# Patient Record
Sex: Female | Born: 1935 | Race: Black or African American | Hispanic: No | Marital: Married | Smoking: Never smoker
Health system: Southern US, Community
[De-identification: ages and names within clinical notes are randomized; demographics above are authoritative.]

## PROBLEM LIST (undated history)

## (undated) DIAGNOSIS — J45909 Unspecified asthma, uncomplicated: Secondary | ICD-10-CM

## (undated) DIAGNOSIS — I10 Essential (primary) hypertension: Secondary | ICD-10-CM

## (undated) DIAGNOSIS — C8590 Non-Hodgkin lymphoma, unspecified, unspecified site: Secondary | ICD-10-CM

## (undated) HISTORY — PX: INGUINAL LYMPH NODE BIOPSY: SHX5865

## (undated) HISTORY — DX: Unspecified asthma, uncomplicated: J45.909

## (undated) HISTORY — DX: Essential (primary) hypertension: I10

---

## 1999-11-14 ENCOUNTER — Ambulatory Visit (HOSPITAL_COMMUNITY): Admission: RE | Admit: 1999-11-14 | Discharge: 1999-11-14 | Payer: Self-pay | Admitting: *Deleted

## 2000-02-26 ENCOUNTER — Encounter: Admission: RE | Admit: 2000-02-26 | Discharge: 2000-02-26 | Payer: Self-pay | Admitting: *Deleted

## 2001-11-04 ENCOUNTER — Ambulatory Visit (HOSPITAL_COMMUNITY): Admission: RE | Admit: 2001-11-04 | Discharge: 2001-11-04 | Payer: Self-pay | Admitting: *Deleted

## 2004-04-06 LAB — HM COLONOSCOPY

## 2008-03-08 ENCOUNTER — Encounter: Admission: RE | Admit: 2008-03-08 | Discharge: 2008-03-08 | Payer: Self-pay | Admitting: Orthopedic Surgery

## 2008-03-09 ENCOUNTER — Ambulatory Visit (HOSPITAL_BASED_OUTPATIENT_CLINIC_OR_DEPARTMENT_OTHER): Admission: RE | Admit: 2008-03-09 | Discharge: 2008-03-09 | Payer: Self-pay | Admitting: Orthopedic Surgery

## 2009-12-11 LAB — HM MAMMOGRAPHY: HM Mammogram: NORMAL

## 2011-03-10 NOTE — Op Note (Signed)
Connie Mccann, Connie Mccann                ACCOUNT NO.:  [id number]   MEDICAL RECORD NO.:  [id number]          PATIENT TYPE:  AMB   LOCATION:  DSC                          FACILITY:  [facility]   PHYSICIAN:  Katy Fitch. Sypher, M.D. DATE OF BIRTH:  [birth year]   DATE OF PROCEDURE:  03/09/2008  DATE OF DISCHARGE:                               OPERATIVE REPORT   PREOPERATIVE DIAGNOSES:  1. Entrapment neuropathy, median nerve, left carpal tunnel.  2. Large mass dorsal aspect of left thumb IP joint with plain x-rays      documenting a large intra-articular loose body and clinical      examination suggesting a mucoid cyst that was extra-articular.   POSTOP DIAGNOSES:  Left carpal tunnel syndrome and left thumb IP joint  loose body, degenerative arthritis, and large dorsal mucoid cyst that  was extra-articular.   OPERATING SURGEON:  Katy Fitch. Sypher, MD   ASSISTANT:  Jonni Sanger, PA   ANESTHESIA:  General by LMA, supervising anesthesiologist, Dr. Jean Rosenthal.   OPERATION:  1. Left carpal tunnel release.  2. Left thumb IP joint debridement with removal of large dorsal      intratendinous loose body.  3. Resection of mucoid cyst from dorsal radial capsule of left thumb.   INDICATIONS:  Connie Mccann is a well-known former patient who presented  for evaluation and management of left hand numbness and left thumb IP  joint arthritis/mass formation.  Clinical examination of  electrodiagnostic studies confirmed severe left carpal tunnel syndrome.  Examination left thumb revealed a large mass measuring nearly 1 cm in  diameter.  This was tender to the touch.  This had the features of a  subcutaneous mucoid cyst on the dorsal radial capsule of the thumb IP  joint, and x-ray of the thumb demonstrated degenerative change with a  large loose body deep to the extensor insertion.  We advised Connie Mccann  to proceed with release of her left transcarpal ligament.  We offered  joint debridement for her cyst and  loose body.  She accepted and  proceeded at this time with both procedures.   After informed consent, she was brought to the operating room.   Preoperatively, she was interviewed by Dr. Jean Rosenthal and general  anesthesia by LMA technique recommended.   PROCEDURE:  Connie Mccann was brought to the operating room and placed in  supine position on the operating table.   Following the induction of general anesthesia by LMA technique, the left  arm was prepped with Betadine soap and solution, sterilely draped.  A  pneumatic tourniquet was applied to the proximal brachium.   On exsanguination of left arm with Esmarch bandage, arterial tourniquet  was inflated to 220 mmHg.  1 gram of Ancef was administered as an IV  prophylactic antibiotic prior to inflation of the tourniquet.   The procedure commenced with a short incision in line of the ring finger  of the palm.  Subcutaneous tissues were carefully divided the palmar  fascia.  This split longitudinally to the common sensory branch of the  median nerve.   These were followed  back to the transcarpal ligament which gently  isolated from the median nerve.  The ligament was then released along  its ulnar border extending to the distal forearm.  This widely opened  carpal canal.  Bleeding points were cauterized bipolar current followed  by repair of the skin with intradermal through Prolene suture.   A compressed dressing was applied.  Attention then directed to the  thumb.  A curvilinear incision was fashioned along the radial aspect of  the thumb across the extension crease of the IP joint.  Subcutaneous  tissues were carefully divided taking care to spare the dorsal sensory  nerve branches.  Dorsal veins were electrocauterized with bipolar  current.  A cyst measuring 8 mm diameter was identified.  This was  circumferentially dissected and removed off the central extensor tendon.  The loose body was palpable within the joint.  As it was  midline, we  performed a tendon splitting incision through the extensor insertion.  With great care, the loose body was circumferentially dissected with a  microcurette and rongeur and ultimately removed as a single fragment.  A  microcurette was used to remove marginal osteophytes along the dorsal  aspect of the distal phalanx.  The wound was then irrigated with 2%  lidocaine.   The wound was repaired with mattress suture of 4-0 Vicryl repairing the  extensor split followed by repair of the skin with interrupted suture of  5-0 nylon.   Connie Mccann tolerated both procedures well.   Her thumb was dressed with sterile gauze and Coban, and the wrist  immobilized with a volar plaster splint maintaining the wrist in 5  degrees of dorsiflexion.   There were no apparent complications.  Connie Mccann was transferred to  recovery room in stable signs.      Katy Fitch Sypher, M.D.  Electronically Signed     RVS/MEDQ  D:  03/09/2008  T:  03/09/2008  Job:  [id number]

## 2011-03-13 NOTE — Procedures (Signed)
[place]  Patient:    Connie Mccann                          MRN: [id number] Proc. Date: 11/14/99 Adm. Date:  [id number] Attending:  Sabino Gasser                           Procedure Report  PROCEDURE:  Colonoscopy with biopsy and polypectomy.  ENDOSCOPIST:  Sabino Gasser, M.D.  INDICATIONS:  Hemoccult positive stool and family history of colon cancer.  ANESTHESIA:  Demerol 75 mg and Versed 7.5 mg were given intravenously in divided doses.  DESCRIPTION OF PROCEDURE:  With the patient mildly sedated in the left lateral decubitus position, the Olympus videoscopic colonoscope was inserted in the rectum and passed under direct vision to the cecum.  The cecum was identified by the ileocecal valve and appendiceal orifice, both of which were photographed.  From  this point, the colonoscope was slowly withdrawn, taking circumferential views f the entire colonic mucosa, stopping at the proximal transverse colon, where a very small polyp was seen and photographed, and using hot biopsy forceps technique, setting of 20/20 blunted current it was removed.  The scope was withdrawn further and in the sigmoid colon, there was some diverticula seen and photographed.  In the proximal 35 cm from the anal verge, a larger polyp, over 1 cm in size on a stalk was seen, photographed, and using snare cautery technique, the polyp was snared at its stalk, and it was resected.  There was good hemostasis.  Tissue was suctioned through the scope and removed and sent for pathology.  The endoscope was then reinserted to this level and slowly withdrawn, taking circumferential views of he remaining colonic mucosa which otherwise appeared normal.  The rectum was viewed in direct view and photographed.  Retroflexed view of the rectum showed small internal hemorrhoids.  The panendoscope was straightened and withdrawn.  The patients vital signs and pulse oximeter remained  stable.  The patient tolerated the procedure well without apparent complications.  FINDINGS:  Small, approximately 2 mm polyp of the proximal transverse colon, removed.  Larger polyp on stalk, greater than 1 cm in the sigmoid colon, also removed by snare.  Mild diverticulosis noted in the sigmoid colon and small internal hemorrhoids.  PLAN:  Will have the patient call me for results of biopsy and follow up with me as an outpatient as needed. DD:  11/14/99 TD:  11/15/99 Job: [id number]

## 2011-07-22 LAB — POCT HEMOGLOBIN-HEMACUE: Hemoglobin: 12.1

## 2012-01-13 ENCOUNTER — Ambulatory Visit: Payer: Medicare Other

## 2012-01-13 ENCOUNTER — Ambulatory Visit (INDEPENDENT_AMBULATORY_CARE_PROVIDER_SITE_OTHER): Payer: Medicare Other | Admitting: Family Medicine

## 2012-01-13 VITALS — BP 188/100 | HR 96 | Temp 98.0°F | Resp 18

## 2012-01-13 DIAGNOSIS — R05 Cough: Secondary | ICD-10-CM

## 2012-01-13 DIAGNOSIS — R059 Cough, unspecified: Secondary | ICD-10-CM

## 2012-01-13 DIAGNOSIS — M25519 Pain in unspecified shoulder: Secondary | ICD-10-CM

## 2012-01-13 DIAGNOSIS — M25569 Pain in unspecified knee: Secondary | ICD-10-CM

## 2012-01-13 MED ORDER — METHYLPREDNISOLONE 4 MG PO TABS: ORAL_TABLET | ORAL | Status: DC

## 2012-01-13 MED ORDER — LEVOFLOXACIN 500 MG PO TABS: 500.0000 mg | ORAL_TABLET | Freq: Every day | ORAL | Status: AC

## 2012-01-13 NOTE — Progress Notes (Signed)
78 yo woman with right shoulder pain since flinging garbage into dumpster two weeks ago  Also had cough x 2 months, taking coricidin.  Very congested with minimal production  O:  NAD Pain right shoulder with abduction, internal rotation Also pain with palpation of anterior right joint line Full but painful arc [facility] reading (PRIMARY) by  Dr. Milus Glazier: right shoulder shows mild degenerative changes only   Chest:  Bilateral ronchi HEENT:  Unremarkable Neck: supple, no adenopathy [facility] reading (PRIMARY) by  Dr. Milus Glazier CXR shows infiltrate RLL  A:  Shoulder strain Early right lower lobe pneumonia with bronchitis  P:  Levaquin Medrol Recheck Friday

## 2012-01-13 NOTE — Patient Instructions (Signed)
Return Friday aftenoon

## 2012-01-29 ENCOUNTER — Ambulatory Visit (INDEPENDENT_AMBULATORY_CARE_PROVIDER_SITE_OTHER): Payer: Medicare Other | Admitting: Family Medicine

## 2012-01-29 ENCOUNTER — Encounter: Payer: Self-pay | Admitting: Family Medicine

## 2012-01-29 VITALS — BP 172/82 | HR 73 | Temp 97.1°F | Resp 16 | Ht 63.0 in | Wt 158.2 lb

## 2012-01-29 DIAGNOSIS — I10 Essential (primary) hypertension: Secondary | ICD-10-CM

## 2012-01-29 DIAGNOSIS — J410 Simple chronic bronchitis: Secondary | ICD-10-CM

## 2012-01-29 DIAGNOSIS — R05 Cough: Secondary | ICD-10-CM

## 2012-01-29 DIAGNOSIS — M19019 Primary osteoarthritis, unspecified shoulder: Secondary | ICD-10-CM

## 2012-01-29 DIAGNOSIS — R059 Cough, unspecified: Secondary | ICD-10-CM

## 2012-01-29 DIAGNOSIS — M25519 Pain in unspecified shoulder: Secondary | ICD-10-CM

## 2012-01-29 LAB — BASIC METABOLIC PANEL
BUN: 10 mg/dL (ref 6–23)
Creat: 0.85 mg/dL (ref 0.50–1.10)

## 2012-01-29 MED ORDER — OMEPRAZOLE 40 MG PO CPDR: 40.0000 mg | DELAYED_RELEASE_CAPSULE | Freq: Every day | ORAL | Status: DC

## 2012-01-29 MED ORDER — MELOXICAM 7.5 MG PO TABS: 7.5000 mg | ORAL_TABLET | Freq: Every day | ORAL | Status: DC

## 2012-01-29 MED ORDER — AMLODIPINE BESYLATE 10 MG PO TABS: 10.0000 mg | ORAL_TABLET | Freq: Every day | ORAL | Status: DC

## 2012-01-29 NOTE — Progress Notes (Signed)
  Subjective:    Patient ID: Connie Mccann, female    DOB: [birth year], 78 y.o.   MRN: [id number]  HPI  Connie Mccann is a 78 y.o. female Seen here 12/2011 for cough and shoulder pain - dx with Shoulder strain, Early right lower lobe pneumonia with bronchitis, treated with Levaquin, Medrol.  CXR report: IMPRESSION:  No segmental infiltrate or pulmonary edema. Mild elevation of the right hemidiaphragm. Linear atelectasis or scarring left lower lobe. Mild bronchitic changes are suspected.  Cough is better now very rare., breathing better, no fever.   R shoulder pain since slinging garbage a month ago.  Heard it pop. Still sore and hurts to lay on the side.  Thought was shoulder strain.  Medrol helped, but ran out.  R handed.  No prior shoulder injury.  Tx:  No recent meds otc.  XR 01/13/12:Two views of the right shoulder submitted. No acute  fracture or subluxation. Mild degenerative changes right AC joint.Minimal spurring of the right humeral head. Mild inferior spurring of the glenoid. IMPRESSION: No acute fracture or subluxation. Mild degenerative changes.  Needs refill of meds  Few weeks of meds left.  No missed doses. Plans on transferring care here from Dr. Renae Gloss.  Home BP's in 130-140/70's  GERD - on prilosec 1 per day. Controls medicine.    Review of Systems  Respiratory: Positive for cough. Negative for chest tightness and shortness of breath.        Minimal cough- much improved.  Cardiovascular: Negative for chest pain.  Genitourinary: Negative for decreased urine volume and difficulty urinating.  Musculoskeletal: Positive for arthralgias.       R shoulder.  Neurological: Negative for dizziness, syncope, light-headedness and headaches.       Objective:   Physical Exam  Constitutional: She appears well-developed and well-nourished.  Eyes: Conjunctivae are normal. Pupils are equal, round, and reactive to light.  Neck: Normal range of motion. Carotid bruit is not present.    Cardiovascular: Normal rate, regular rhythm, normal heart sounds and intact distal pulses.   No murmur heard. Pulmonary/Chest: Effort normal and breath sounds normal. No respiratory distress.       Clear   Musculoskeletal:       Right shoulder: She exhibits tenderness. She exhibits normal range of motion.       Arms: Psychiatric: She has a normal mood and affect. Her behavior is normal.          Assessment & Plan:  Connie Mccann is a 78 y.o. female Pneumonia/likley bronchitis - improved post Levaquin.  RTC precautions.  R shoulder pain - AC strain likely with hx degenerative dz.  Possible secondary rtc strain/tendinosis. Discussed ortho referral or PT, as may need injection - but declined at present.  Wants to try 2 weeks of anti-inflammatory, then if not improved, ortho referral.  Monitor BP when on this medicine.    HTN - check BMP, refilled meds for 6 months (printed) as home readings ok, but monitor home BP's and bring record to next office visit.  GERD - controlled on prilosec. Meds refilled for 6 months.  Outside/prior primary provider records requested.  Follow up in next 3-6 months, sooner if worse.

## 2012-01-29 NOTE — Patient Instructions (Addendum)
Check blood pressures at home, if running high - return to clinic.  Can try meloxicam 1 per day for pain in shoulder - and exercises as able. (don't do these if it hurts.  Recheck or call me in 2 weeks if shoulder not improved and we can refer you to an orthopedic doctor. Follow up for blood pressure and heartburn in the next 3 to 6 months.  Acromioclavicular Injuries The AC (acromioclavicular) joint is the joint in the shoulder where the collarbone (clavicle) meets the shoulder blade (scapula). The part of the shoulder blade connected to the collarbone is called the acromion. Common problems with and treatments for the [facility] joint are detailed below. ARTHRITIS Arthritis occurs when the joint has been injured and the smooth padding between the joints (cartilage) is lost. This is the wear and tear seen in most joints of the body if they have been overused. This causes the joint to produce pain and swelling which is worse with activity.  AC JOINT SEPARATION AC joint separation means that the ligaments connecting the acromion of the shoulder blade and collarbone have been damaged, and the two bones no longer line up. AC separations can be anywhere from mild to severe, and are "graded" depending upon which ligaments are torn and how badly they are torn.  Grade I Injury: the least damage is done, and the [facility] joint still lines up.   Grade II Injury: damage to the ligaments which reinforce the [facility] joint. In a Grade II injury, these ligaments are stretched but not entirely torn. When stressed, the [facility] joint becomes painful and unstable.   Grade III Injury: AC and secondary ligaments are completely torn, and the collarbone is no longer attached to the shoulder blade. This results in deformity; a prominence of the end of the clavicle.  AC JOINT FRACTURE AC joint fracture means that there has been a break in the bones of the [facility] joint, usually the end of the clavicle. TREATMENT TREATMENT OF AC ARTHRITIS  There is  currently no way to replace the cartilage damaged by arthritis. The best way to improve the condition is to decrease the activities which aggravate the problem. Application of ice to the joint helps decrease pain and soreness (inflammation). The use of non-steroidal anti-inflammatory medication is helpful.   If less conservative measures do not work, then cortisone shots (injections) may be used. These are anti-inflammatories; they decrease the soreness in the joint and swelling.   If non-surgical measures fail, surgery may be recommended. The procedure is generally removal of a portion of the end of the clavicle. This is the part of the collarbone closest to your acromion which is stabilized with ligaments to the acromion of the shoulder blade. This surgery may be performed using a tube-like instrument with a light (arthroscope) for looking into a joint. It may also be performed as an open surgery through a small incision by the surgeon. Most patients will have good range of motion within 6 weeks and may return to all activity including sports by 8-12 weeks, barring complications.  TREATMENT OF AN AC SEPARATION  The initial treatment is to decrease pain. This is best accomplished by immobilizing the arm in a sling and placing an ice pack to the shoulder for 20 to 30 minutes every 2 hours as needed. As the pain starts to subside, it is important to begin moving the fingers, wrist, elbow and eventually the shoulder in order to prevent a stiff or "frozen" shoulder. Instruction on when  and how much to move the shoulder will be provided by your caregiver. The length of time needed to regain full motion and function depends on the amount or grade of the injury. Recovery from a Grade I AC separation usually takes 10 to 14 days, whereas a Grade III may take 6 to 8 weeks.   Grade I and II separations usually do not require surgery. Even Grade III injuries usually allow return to full activity with few restrictions.  Treatment is also based on the activity demands of the injured shoulder. For example, a high level quarterback with an injured throwing arm will receive more aggressive treatment than someone with a desk job who rarely uses his/her arm for strenuous activities. In some cases, a painful lump may persist which could require a later surgery. Surgery can be very successful, but the benefits must be weighed against the potential risks.  TREATMENT OF AN AC JOINT FRACTURE Fracture treatment depends on the type of fracture. Sometimes a splint or sling may be all that is required. Other times surgery may be required for repair. This is more frequently the case when the ligaments supporting the clavicle are completely torn. Your caregiver will help you with these decisions and together you can decide what will be the best treatment. HOME CARE INSTRUCTIONS   Apply ice to the injury for 15 to 20 minutes each hour while awake for 2 days. Put the ice in a plastic bag and place a towel between the bag of ice and skin.   If a sling has been applied, wear it constantly for as long as directed by your caregiver, even at night. The sling or splint can be removed for bathing or showering or as directed. Be sure to keep the shoulder in the same place as when the sling is on. Do not lift the arm.   If a figure-of-eight splint has been applied it should be tightened gently by another person every day. Tighten it enough to keep the shoulders held back. Allow enough room to place the index finger between the body and strap. Loosen the splint immediately if there is numbness or tingling in the hands.   Take over-the-counter or prescription medicines for pain, discomfort or fever as directed by your caregiver.   If you or your child has received a follow up appointment, it is very important to keep that appointment in order to avoid long term complications, chronic pain or disability.  SEEK MEDICAL CARE IF:   The pain is not  relieved with medications.   There is increased swelling or discoloration that continues to get worse rather than better.   You or your child has been unable to follow up as instructed.   There is progressive numbness and tingling in the arm, forearm or hand.  SEEK IMMEDIATE MEDICAL CARE IF:   The arm is numb, cold or pale.   There is increasing pain in the hand, forearm or fingers.  MAKE SURE YOU:   Understand these instructions.   Will watch your condition.   Will get help right away if you are not doing well or get worse.  Document Released: 07/22/2005 Document Revised: 10/01/2011 Document Reviewed: 01/14/2009 [facility] Patient Information 2012 [place], [place].  Return to the clinic or go to the nearest emergency room if any of your symptoms worsen or new symptoms occur.

## 2012-04-12 ENCOUNTER — Ambulatory Visit (INDEPENDENT_AMBULATORY_CARE_PROVIDER_SITE_OTHER): Payer: Medicare Other | Admitting: Family Medicine

## 2012-04-12 ENCOUNTER — Ambulatory Visit: Payer: Medicare Other

## 2012-04-12 VITALS — BP 176/72 | HR 96 | Temp 98.0°F | Resp 20 | Ht 64.0 in | Wt 158.0 lb

## 2012-04-12 DIAGNOSIS — J189 Pneumonia, unspecified organism: Secondary | ICD-10-CM

## 2012-04-12 DIAGNOSIS — R05 Cough: Secondary | ICD-10-CM

## 2012-04-12 LAB — POCT CBC
Granulocyte percent: 44.8 %G (ref 37–80)
MCHC: 31.4 g/dL — AB (ref 31.8–35.4)
MCV: 80.1 fL (ref 80–97)
MID (cbc): 0.8 (ref 0–0.9)
POC LYMPH PERCENT: 41.3 %L (ref 10–50)
Platelet Count, POC: 255 10*3/uL (ref 142–424)
RDW, POC: 15.3 %

## 2012-04-12 MED ORDER — AZITHROMYCIN 250 MG PO TABS: ORAL_TABLET | ORAL | Status: AC

## 2012-04-12 NOTE — Progress Notes (Signed)
  Subjective:    Patient ID: Candelaria Stagers, female    DOB: Jun 21, 1931, 78 y.o.   MRN: [id number]  HPI ANNIAH GLICK is a 78 y.o. female Cough - past 1 week.  White phlegm, brown at times.  Hearing breathing at night. Breathing out loud - ? Wheeze.  No known fever. No dyspnea. No hx of asthma or breathing treatments, no hx CHF.  01/13/12 - RLL pna. - tx with Levaquin.  CXR report: IMPRESSION:No segmental infiltrate or pulmonary edema. Mild elevation of the right hemidiaphragm. Linear atelectasis or scarring left lower lobe. Mild bronchitic changes are suspected.    Tx: none.   Review of Systems  Constitutional: Negative for fever and chills.  Respiratory: Positive for cough. Negative for shortness of breath and wheezing.    Per hpi.     Objective:   Physical Exam  Constitutional: She is oriented to person, place, and time. She appears well-developed and well-nourished.  HENT:  Head: Normocephalic and atraumatic.  Cardiovascular: Normal rate, regular rhythm, normal heart sounds and intact distal pulses.   Pulmonary/Chest: Effort normal. She has rhonchi in the right lower field.  Neurological: She is alert and oriented to person, place, and time.  Skin: Skin is warm and dry.  Psychiatric: She has a normal mood and affect. Her behavior is normal.    [facility] reading (PRIMARY) by  Dr. Neva Seat: CXR: increased RLL markings - early infiltrate.  Results for orders placed in visit on 04/12/12  POCT CBC      Component Value Range   WBC 5.7  4.6 - 10.2 K/uL   Lymph, poc 2.4  0.6 - 3.4   POC LYMPH PERCENT 41.3  10 - 50 %L   MID (cbc) 0.8  0 - 0.9   POC MID % 13.9 (*) 0 - 12 %M   POC Granulocyte 2.6  2 - 6.9   Granulocyte percent 44.8  37 - 80 %G   RBC 5.96 (*) 4.04 - 5.48 M/uL   Hemoglobin 15.0  12.2 - 16.2 g/dL   HCT, POC 40.9  81.1 - 47.9 %   MCV 80.1  80 - 97 fL   MCH, POC 25.2 (*) 27 - 31.2 pg   MCHC 31.4 (*) 31.8 - 35.4 g/dL   RDW, POC 91.4     Platelet Count, POC 255  142 - 424  K/uL   MPV 9.9  0 - 99.8 fL       Assessment & Plan:  HADASAH BRUGGER is a 78 y.o. female  Start Z pak, trial of fluids, relative rest, mucinex otc next 2 days, and recheck. consider neb if any wheeze then, or rtc sooner if any new or worsening symptoms.

## 2012-04-12 NOTE — Patient Instructions (Signed)
over the counter mucinex or mucinex DM, drink plenty of fluids.  Start antibiotic and recheck on Thursday June 20th between 10 am and 4 pm. Return to the clinic or go to the nearest emergency room if any of your symptoms worsen or new symptoms occur.

## 2012-04-14 ENCOUNTER — Ambulatory Visit (INDEPENDENT_AMBULATORY_CARE_PROVIDER_SITE_OTHER): Payer: Medicare Other | Admitting: Family Medicine

## 2012-04-14 ENCOUNTER — Ambulatory Visit: Payer: Medicare Other

## 2012-04-14 VITALS — BP 177/77 | HR 86 | Temp 98.2°F | Resp 16 | Ht 63.5 in | Wt 159.0 lb

## 2012-04-14 DIAGNOSIS — R002 Palpitations: Secondary | ICD-10-CM

## 2012-04-14 DIAGNOSIS — J9801 Acute bronchospasm: Secondary | ICD-10-CM

## 2012-04-14 DIAGNOSIS — R05 Cough: Secondary | ICD-10-CM

## 2012-04-14 MED ORDER — IPRATROPIUM BROMIDE 0.02 % IN SOLN
0.5000 mg | Freq: Once | RESPIRATORY_TRACT | Status: AC
  Administered 2012-04-14: 0.5 mg via RESPIRATORY_TRACT

## 2012-04-14 MED ORDER — IPRATROPIUM BROMIDE HFA 17 MCG/ACT IN AERS: 2.0000 | INHALATION_SPRAY | Freq: Four times a day (QID) | RESPIRATORY_TRACT | Status: DC

## 2012-04-14 MED ORDER — ALBUTEROL SULFATE (2.5 MG/3ML) 0.083% IN NEBU
2.5000 mg | INHALATION_SOLUTION | Freq: Once | RESPIRATORY_TRACT | Status: AC
  Administered 2012-04-14: 2.5 mg via RESPIRATORY_TRACT

## 2012-04-14 NOTE — Progress Notes (Signed)
  Subjective:    Patient ID: Connie Mccann, female    DOB: 06-30-1931, 78 y.o.   MRN: [id number]  HPI Connie Mccann is a 78 y.o. female See 04/12/12 OV - started on Z pak for possible early pna vs bronchitis. CXR report: IMPRESSION:  Left basilar subsegmental atelectasis.  Feels better.  Not coughing as much.  Still hears breathing at times at night.   fever - unknown number - 2 days ago only.  mucinex is helping - s/p 3 doses of Z pak.    Hasn't taken  BP meds yet this am.  No chest pain.  No orthopnea or DOE.   Added hx later in visit:  Palpitations - faster heartrate few months ago - lasted 1 to 2 days only, but no recent symptoms, denies chest pain/pressure, and no known hx of CHF.  Also no known hx COPD/asthma. No hx glaucoma or peanut allergy.  Review of Systems  Constitutional: Positive for fever and chills.       See hpi.   Respiratory: Positive for cough and wheezing. Negative for shortness of breath.   Cardiovascular: Negative for chest pain and leg swelling.       Objective:   Physical Exam  Constitutional: She is oriented to person, place, and time. She appears well-developed and well-nourished. No distress.  HENT:  Head: Normocephalic and atraumatic.  Right Ear: Hearing, tympanic membrane, external ear and ear canal normal.  Left Ear: Hearing, tympanic membrane, external ear and ear canal normal.  Nose: Nose normal.  Mouth/Throat: Oropharynx is clear and moist. No oropharyngeal exudate.  Eyes: Conjunctivae and EOM are normal. Pupils are equal, round, and reactive to light.  Cardiovascular: Normal rate, regular rhythm, normal heart sounds and intact distal pulses.   No murmur heard. Pulmonary/Chest: Effort normal. No respiratory distress. She has wheezes in the right lower field, the left upper field and the left lower field. She has no rhonchi. She has no rales.  Neurological: She is alert and oriented to person, place, and time.  Skin: Skin is warm and dry. No rash  noted.  Psychiatric: She has a normal mood and affect. Her behavior is normal.   Nebulizer 2.5mg  albuterol given - persistent diffuse wheeze without apparent symptomatic improvement.   Atrovent neb: clearing of wheeze - good air exchange. No distress.  Feels much better.   EKG:  Sinus rhythm,  Nonspecific/flat T in III. LVH. [facility] reading (PRIMARY) by  Dr. Neva Seat. CXR: atelectasis.  No change form prior.     Assessment & Plan:  Connie Mccann is a 78 y.o. female Cough, with bronchospasm.  Continue Z pak, add atrovent HFA  - 1-2 puffs QID prn as this worked better than albuterol. Recheck in 2-4 days,  Sooner if any worsening. - RTC/ER precautions discussed.   Palpitations - distant.  Hx of HTN - will take meds at home, and recheck next ov.  RTC/ER if sx's recur.  Patient Instructions  Continue antibiotic.  Can stop mucinex. Start inhaler - 1 -2 puffs up to every 6 hours if needed for wheezing or "noisy breathing".  Recheck in next 2 days if not improving, or in 4 days if you are improving.  Return to the clinic or go to the nearest emergency room if any of your symptoms worsen or new symptoms occur.

## 2012-04-14 NOTE — Patient Instructions (Signed)
Continue antibiotic.  Can stop mucinex. Start inhaler - 1 -2 puffs up to every 6 hours if needed for wheezing or "noisy breathing".  Recheck in next 2 days if not improving, or in 4 days if you are improving.  Return to the clinic or go to the nearest emergency room if any of your symptoms worsen or new symptoms occur.

## 2012-04-18 ENCOUNTER — Ambulatory Visit (INDEPENDENT_AMBULATORY_CARE_PROVIDER_SITE_OTHER): Payer: Medicare Other | Admitting: Family Medicine

## 2012-04-18 VITALS — BP 154/69 | HR 76 | Temp 97.9°F | Resp 16 | Ht 63.0 in | Wt 158.6 lb

## 2012-04-18 DIAGNOSIS — R05 Cough: Secondary | ICD-10-CM

## 2012-04-18 DIAGNOSIS — R0989 Other specified symptoms and signs involving the circulatory and respiratory systems: Secondary | ICD-10-CM

## 2012-04-18 DIAGNOSIS — J9801 Acute bronchospasm: Secondary | ICD-10-CM

## 2012-04-18 DIAGNOSIS — R0600 Dyspnea, unspecified: Secondary | ICD-10-CM

## 2012-04-18 MED ORDER — PREDNISONE 20 MG PO TABS: ORAL_TABLET | ORAL | Status: DC

## 2012-04-18 NOTE — Progress Notes (Signed)
  Subjective:    Patient ID: Connie Mccann, female    DOB: [birth year], 78 y.o.   MRN: [id number]  HPI Connie Mccann is a 78 y.o. female Cough with bronchospasm.  Initially started on Z pak 6/18, then at recheck 6/20 - bronchospasm - improved with atrovent.  Rx atrovent HFA.Marland Kitchen  Using inhaler every 4 hours - still coughing, wheezing - seems to help some - spits up some stuff, coughs up some.  No fever.  Just cough, and wheeze.  Notes wheeze at night, more with lying down. Occasional short of breath. When climbing steps.   Xray report 6/20: Cardiopericardial silhouette appears within normal  limits. Right glenohumeral osteoarthritis. No airspace disease.  No effusion. Mild scarring is present at the cardiac apex. Mild  aortic arch atherosclerosis.  Nonsmoker, no smokers at home.  No secondhand smoke.    Review of Systems  Constitutional: Negative for fever, chills and unexpected weight change.  Respiratory: Positive for cough and wheezing. Negative for shortness of breath.   Cardiovascular: Negative for chest pain and leg swelling.       Objective:   Physical Exam  Constitutional: She is oriented to person, place, and time. She appears well-developed and well-nourished.  HENT:  Head: Normocephalic and atraumatic.  Right Ear: External ear normal.  Left Ear: External ear normal.  Mouth/Throat: Oropharynx is clear and moist.  Eyes: EOM are normal. Pupils are equal, round, and reactive to light.  Neck: Normal range of motion. Neck supple.  Cardiovascular: Normal rate, regular rhythm, normal heart sounds and intact distal pulses.  Exam reveals no gallop.   No murmur heard. Pulmonary/Chest: Effort normal. No respiratory distress. She has wheezes in the left upper field.       1 scattered wheeze, L upper lobe, but clear otherwise.  Normal effort.    Neurological: She is alert and oriented to person, place, and time.  Skin: Skin is warm and dry. No rash noted.  Psychiatric: She has a  normal mood and affect. Her behavior is normal.   Results for orders placed in visit on 04/18/12  GLUCOSE, POCT (MANUAL RESULT ENTRY)      Component Value Range   POC Glucose 110 (*) 70 - 99 mg/dl   Nonfasting.     Assessment & Plan:  Connie Mccann is a 78 y.o. female 1. Bronchospasm  POCT glucose (manual entry)  2. Cough  POCT glucose (manual entry)   Improved some today, with use of atrovent frequently - can try prednisone 20mg  - 2 qd x 5 days. - sed. No sign of failure on CXR, but will check BNP.  rtc/er precautions discussed.  Continue atrovent ns Q6h prn. Recheck or phone call in 4 days if improved.

## 2012-04-19 LAB — BRAIN NATRIURETIC PEPTIDE: Brain Natriuretic Peptide: 8.8 pg/mL (ref 0.0–100.0)

## 2012-04-22 ENCOUNTER — Telehealth: Payer: Self-pay

## 2012-04-22 NOTE — Telephone Encounter (Signed)
Pt is calling per dr Haze Justin request to update on her condition. She has taken the last rx this morning and so far so good Please call to advise what to do now

## 2012-04-23 NOTE — Telephone Encounter (Signed)
Patient notified info below-- also advised about lab results since we were unable to reach her.

## 2012-04-23 NOTE — Telephone Encounter (Signed)
No new meds or treatments at this point if improving.  If not continuing to improve in next few days, then recheck.

## 2012-04-29 ENCOUNTER — Ambulatory Visit: Payer: Medicare Other | Admitting: Family Medicine

## 2012-05-17 ENCOUNTER — Other Ambulatory Visit: Payer: Self-pay | Admitting: Family Medicine

## 2012-05-18 ENCOUNTER — Telehealth: Payer: Self-pay

## 2012-05-18 NOTE — Telephone Encounter (Signed)
Pt calling about status of her prednisone refill. States that [employer] contacted [place] yesterday requesting refill. She would like more than 5 doses. Hugh [phone]

## 2012-05-18 NOTE — Telephone Encounter (Signed)
That is not a medication we refill without an OV.  Please RTC.

## 2012-05-19 NOTE — Telephone Encounter (Signed)
Called no answer and no voicemail set up. Try later.

## 2012-05-20 NOTE — Telephone Encounter (Signed)
ADVISED PT THAT SHE MUST RTC

## 2012-05-21 ENCOUNTER — Ambulatory Visit (INDEPENDENT_AMBULATORY_CARE_PROVIDER_SITE_OTHER): Payer: Medicare Other | Admitting: Internal Medicine

## 2012-05-21 VITALS — BP 188/96 | HR 88 | Temp 97.7°F | Resp 16 | Ht 64.0 in | Wt 164.4 lb

## 2012-05-21 DIAGNOSIS — R05 Cough: Secondary | ICD-10-CM

## 2012-05-21 DIAGNOSIS — K219 Gastro-esophageal reflux disease without esophagitis: Secondary | ICD-10-CM

## 2012-05-21 DIAGNOSIS — R062 Wheezing: Secondary | ICD-10-CM

## 2012-05-21 DIAGNOSIS — I10 Essential (primary) hypertension: Secondary | ICD-10-CM

## 2012-05-21 DIAGNOSIS — J45909 Unspecified asthma, uncomplicated: Secondary | ICD-10-CM

## 2012-05-21 MED ORDER — PREDNISONE 20 MG PO TABS: ORAL_TABLET | ORAL | Status: DC

## 2012-05-21 MED ORDER — BECLOMETHASONE DIPROPIONATE 80 MCG/ACT IN AERS: 1.0000 | INHALATION_SPRAY | Freq: Two times a day (BID) | RESPIRATORY_TRACT | Status: DC

## 2012-05-21 MED ORDER — ALBUTEROL SULFATE (2.5 MG/3ML) 0.083% IN NEBU
2.5000 mg | INHALATION_SOLUTION | Freq: Once | RESPIRATORY_TRACT | Status: AC
  Administered 2012-05-21: 2.5 mg via RESPIRATORY_TRACT

## 2012-05-21 NOTE — Patient Instructions (Addendum)
Use of prednisone for 6 days and start Qvar inhaler twice a day for 2 months He may stay on your same blood pressure medicine Use Atrovent twice a day for the next 10 days Recheck in one week if you or not feeling very good Recheck in one month to see if your medicines need to be changed

## 2012-05-21 NOTE — Progress Notes (Signed)
  Subjective:    Patient ID: Connie Mccann, female    DOB: [birth year], 78 y.o.   MRN: [id number]  HPIHe continues to complain of marked cough starting around 4 AM and lasting until mid day Slightly productive at times Creates and some shortness of breath and  no feverOr weight loss and no night sweats No sinus congestion No sore throat No reflux Did respond to steroid therapy but relapsed   Treated in March with antibiotics and steroids/treated again with Zithromax and steroids over the past 4 weeks 3 chest x-ray since March documenting no cancer or pleural effusion  Review of Systems There is no problem list on file for this patient. Home Norvasc for hypertension Omeprazole but denies reflux symptoms No hoarseness  no dysphagia No nausea vomiting No chest pain or abdominal pain No palpitations No peripheral edema     Objective:   Physical Exam  Filed Vitals:   05/21/12 0806  BP: 188/96  Pulse: 88  Temp: 97.7 F (36.5 C)  Resp: 16  HEENT clear Heart regular without murmur or gallop No peripheral edema Full peripheral pulses Lungs with wheezing on inspiration and expiration bilaterally  Nebulizer with Ventolin produced 75% clearing but she continues with wheezing on forced expiration Had some subjective improvement        Assessment & Plan:  Problem #1 reactive airway disease of unclear etiology? Postviral syndrome  Meds ordered this encounter  Medications  .       . predniSONE (DELTASONE) 20 MG tablet    Sig: 3/3/2/2/1/1 single daily dose for 6 days    Dispense:  12 tablet    Refill:  0  . beclomethasone (QVAR) 80 MCG/ACT inhaler For the next 2 months    Sig: Inhale 1 puff into the lungs 2 (two) times daily.    Dispense:  1 Inhaler    Refill:  1    -  Continue Atrovent twice a day/actually restarted since she's quit taking it  If not resolved will need pulmonary consult for comprehensive pulmonary function testing and consideration of chest CT Needs  overall physical assessment if she is going to continue per primary care/will set up at 104

## 2012-06-09 ENCOUNTER — Other Ambulatory Visit: Payer: Self-pay | Admitting: *Deleted

## 2012-06-09 MED ORDER — AMLODIPINE BESYLATE 10 MG PO TABS: 10.0000 mg | ORAL_TABLET | Freq: Every day | ORAL | Status: DC

## 2012-06-09 MED ORDER — OMEPRAZOLE 40 MG PO CPDR: 40.0000 mg | DELAYED_RELEASE_CAPSULE | Freq: Every day | ORAL | Status: DC

## 2012-07-01 ENCOUNTER — Ambulatory Visit: Payer: Medicare Other

## 2012-07-01 ENCOUNTER — Telehealth: Payer: Self-pay

## 2012-07-01 ENCOUNTER — Ambulatory Visit (INDEPENDENT_AMBULATORY_CARE_PROVIDER_SITE_OTHER): Payer: Medicare Other | Admitting: Family Medicine

## 2012-07-01 VITALS — BP 124/80 | HR 77 | Temp 98.0°F | Resp 16 | Ht 63.0 in | Wt 164.0 lb

## 2012-07-01 DIAGNOSIS — J45991 Cough variant asthma: Secondary | ICD-10-CM

## 2012-07-01 DIAGNOSIS — R059 Cough, unspecified: Secondary | ICD-10-CM

## 2012-07-01 DIAGNOSIS — R05 Cough: Secondary | ICD-10-CM

## 2012-07-01 DIAGNOSIS — J40 Bronchitis, not specified as acute or chronic: Secondary | ICD-10-CM

## 2012-07-01 LAB — POCT CBC
Granulocyte percent: 47.1 %G (ref 37–80)
HCT, POC: 44.7 % (ref 37.7–47.9)
Hemoglobin: 13.8 g/dL (ref 12.2–16.2)
Lymph, poc: 2.4 (ref 0.6–3.4)
MCHC: 30.9 g/dL — AB (ref 31.8–35.4)
MCV: 82.3 fL (ref 80–97)
POC Granulocyte: 2.9 (ref 2–6.9)

## 2012-07-01 MED ORDER — AMOXICILLIN 500 MG PO CAPS: 1000.0000 mg | ORAL_CAPSULE | Freq: Two times a day (BID) | ORAL | Status: AC

## 2012-07-01 MED ORDER — BENZONATATE 100 MG PO CAPS: ORAL_CAPSULE | ORAL | Status: AC

## 2012-07-01 NOTE — Progress Notes (Signed)
Subjective: 78 year old lady who came [place] from [place] yesterday. They had a 800 mile drive. She started coughing yesterday it is continued coughing and bringing up more phlegm today. He has color to it. She just felt funny in her chest though she's not feverish or terribly sick or short of breath. Her husband has little cough. She has not had a lot of respiratory tract infections.  Objective: Elderly lady with a cough otherwise in no acute distress. Her throat was clear. Neck supple without nodes thyromegaly. Chest is clear on the right at the left base has some rhonchi. Heart regular without murmurs.  Assessment: Cough Rhonchi  Plan: CBC and CXR  Results for orders placed in visit on 07/01/12  POCT CBC      Component Value Range   WBC 6.2  4.6 - 10.2 K/uL   Lymph, poc 2.4  0.6 - 3.4   POC LYMPH PERCENT 39.3  10 - 50 %L   MID (cbc) 0.8  0 - 0.9   POC MID % 13.6 (*) 0 - 12 %M   POC Granulocyte 2.9  2 - 6.9   Granulocyte percent 47.1  37 - 80 %G   RBC 5.43  4.04 - 5.48 M/uL   Hemoglobin 13.8  12.2 - 16.2 g/dL   HCT, POC 14.7  82.9 - 47.9 %   MCV 82.3  80 - 97 fL   MCH, POC 25.4 (*) 27 - 31.2 pg   MCHC 30.9 (*) 31.8 - 35.4 g/dL   RDW, POC 56.2     Platelet Count, POC 283  142 - 424 K/uL   MPV 10.5  0 - 99.8 fL   [facility] reading (PRIMARY) by  Dr. Alwyn Ren Possible early lll infiltrate .

## 2012-07-01 NOTE — Telephone Encounter (Signed)
PT STATES THAT SHE HAS A CHEST COLD AND WOULD LIKE FOR SOMETHING TO BE PRESCRIBED, PT STATES THAT SHE DOES NOT WANT TO COME IN. BEST# [phone]

## 2012-07-01 NOTE — Patient Instructions (Addendum)
Drink lots of fluids  Tessalon for cough  Amoxicillin 500 mg 2 tablets twice daily

## 2012-07-01 NOTE — Telephone Encounter (Signed)
I have reviewed chart at last visit Dr Merla Riches was concerned about possible reactive airway disease. Patient is advised she is to come in for this today.

## 2012-08-16 ENCOUNTER — Ambulatory Visit: Payer: Medicare Other

## 2012-08-16 ENCOUNTER — Ambulatory Visit (INDEPENDENT_AMBULATORY_CARE_PROVIDER_SITE_OTHER): Payer: Medicare Other | Admitting: Internal Medicine

## 2012-08-16 VITALS — BP 152/82 | HR 81 | Temp 98.0°F | Resp 16 | Ht 64.0 in | Wt 162.0 lb

## 2012-08-16 DIAGNOSIS — J9801 Acute bronchospasm: Secondary | ICD-10-CM

## 2012-08-16 DIAGNOSIS — J189 Pneumonia, unspecified organism: Secondary | ICD-10-CM

## 2012-08-16 DIAGNOSIS — R05 Cough: Secondary | ICD-10-CM

## 2012-08-16 LAB — POCT CBC
Lymph, poc: 2.5 (ref 0.6–3.4)
MCH, POC: 25.1 pg — AB (ref 27–31.2)
MCHC: 30.7 g/dL — AB (ref 31.8–35.4)
MID (cbc): 0.8 (ref 0–0.9)
MPV: 9.1 fL (ref 0–99.8)
POC MID %: 11.5 %M (ref 0–12)
Platelet Count, POC: 367 10*3/uL (ref 142–424)
WBC: 7 10*3/uL (ref 4.6–10.2)

## 2012-08-16 MED ORDER — CEFTRIAXONE SODIUM 1 G IJ SOLR
1.0000 g | INTRAMUSCULAR | Status: DC
  Administered 2012-08-16: 1 g via INTRAMUSCULAR

## 2012-08-16 MED ORDER — AZITHROMYCIN 500 MG PO TABS: 500.0000 mg | ORAL_TABLET | Freq: Every day | ORAL | Status: DC

## 2012-08-16 MED ORDER — ALBUTEROL SULFATE (2.5 MG/3ML) 0.083% IN NEBU
2.5000 mg | INHALATION_SOLUTION | Freq: Once | RESPIRATORY_TRACT | Status: AC
  Administered 2012-08-16: 2.5 mg via RESPIRATORY_TRACT

## 2012-08-16 MED ORDER — IPRATROPIUM BROMIDE 0.02 % IN SOLN
0.5000 mg | Freq: Once | RESPIRATORY_TRACT | Status: AC
  Administered 2012-08-16: 0.5 mg via RESPIRATORY_TRACT

## 2012-08-16 MED ORDER — ALBUTEROL SULFATE HFA 108 (90 BASE) MCG/ACT IN AERS: 2.0000 | INHALATION_SPRAY | Freq: Four times a day (QID) | RESPIRATORY_TRACT | Status: DC | PRN

## 2012-08-16 MED ORDER — HYDROCODONE-ACETAMINOPHEN 7.5-500 MG/15ML PO SOLN: 5.0000 mL | Freq: Four times a day (QID) | ORAL | Status: DC | PRN

## 2012-08-16 NOTE — Progress Notes (Signed)
  Subjective:    Patient ID: Connie Mccann, female    DOB: 1931/03/05, 78 y.o.   MRN: [id number]  HPI Cough and chest cold persists with brown and green sputum and lots of production.. Only mild and occasional sob. Had infiltrate on cxr 9/6. No night sweats, anorexia, weight loss.   Review of Systems     Objective:   Physical Exam  Constitutional: She is oriented to person, place, and time. She appears well-developed and well-nourished. No distress.  HENT:  Mouth/Throat: Oropharynx is clear and moist.  Eyes: EOM are normal.  Neck: Normal range of motion.  Cardiovascular: Normal rate, regular rhythm and normal heart sounds.   Pulmonary/Chest: Effort normal. Not tachypneic. No respiratory distress. She has no decreased breath sounds. She has wheezes. She has rhonchi. She has no rales.  Lymphadenopathy:    She has no cervical adenopathy.  Neurological: She is alert and oriented to person, place, and time. Coordination normal.  Skin: Skin is warm and dry. She is not diaphoretic.  Psychiatric: She has a normal mood and affect.   Results for orders placed in visit on 08/16/12  POCT CBC      Component Value Range   WBC 7.0  4.6 - 10.2 K/uL   Lymph, poc 2.5  0.6 - 3.4   POC LYMPH PERCENT 35.9  10 - 50 %L   MID (cbc) 0.8  0 - 0.9   POC MID % 11.5  0 - 12 %M   POC Granulocyte 3.7  2 - 6.9   Granulocyte percent 52.6  37 - 80 %G   RBC 5.61 (*) 4.04 - 5.48 M/uL   Hemoglobin 14.1  12.2 - 16.2 g/dL   HCT, POC 40.9  81.1 - 47.9 %   MCV 81.8  80 - 97 fL   MCH, POC 25.1 (*) 27 - 31.2 pg   MCHC 30.7 (*) 31.8 - 35.4 g/dL   RDW, POC 91.4     Platelet Count, POC 367  142 - 424 K/uL   MPV 9.1  0 - 99.8 fL     [facility] reading (PRIMARY) by  Dr.Guest. cxr ? Right hilar density with calcified cyst PFR 250 Neb given  PPD rec    Assessment & Plan:  Recurrent asthmatic bronchitis with copious green/brown sputum Rocephin 1 g Zithromax 500mg Connie Mccann

## 2012-08-16 NOTE — Patient Instructions (Addendum)
Chronic Asthmatic Bronchitis Chronic asthmatic bronchitis is often a complication of frequent asthma and/or bronchitis. After a long enough period of time, the continual airflow blockage is present in spite of treatment for asthma. The medications that used to treat asthma no longer work. The symptoms of chronic bronchitis may also be present. Bronchitis is an inflammation of the breathing tubules in the lungs. The combination of asthma, chronic bronchitis, and emphysema all affect the small breathing tubules (bronchial tree) in our lungs. It is a common condition. The problems from each are similar and overlap with each other so are sometimes hard to diagnose. When the asthma and bronchitis are combined, there is usually inflammation and infection. The small bronchial tubes produce more mucus. This blocks the airways and makes breathing harder. Usually this process is caused more by external irritants than infection. Smokers with chronic bronchitis are at a greater risk to develop asthmatic bronchitis. CAUSES   Why some people with asthma go on to develop chronic asthmatic bronchitis is not known. Smoking and environmental toxins or allergens seem to play a role. There are wide differences in who is susceptible.  Abnormalities of the small airways may develop in persons with persistent asthma. Asthmatics can be uncommonly subject to the effects of smoking. Asthma is also found associated with a number of other diseases. SYMPTOMS  Asthma, chronic bronchitis, and emphysema all cause symptoms of cough, wheezing, shortness of breath, and recurring infections. There may also be chest discomfort. All of the above symptoms happen more often in chronic asthmatic bronchitis. DIAGNOSIS   Asthma, chronic bronchitis, and emphysema all affect the entire bronchial tree. This makes it difficult on exam to tell them apart. Other tests of the lungs are done to prove a diagnosis. These are called pulmonary function  tests. TREATMENT   The asthmatic condition itself must always be treated.  Infection can be treated with antibiotics (medications to kill germs).  Serious infections may require hospitalization. These can include pneumonia, sinus infections, and acute bronchitis. HOME CARE INSTRUCTIONS  Use prescription medications as ordered by your caregiver.  Avoid pollen, dust, animal dander, molds, smoke, and other things that cause attacks at home and at work.  You may have fewer attacks if you decrease dust in your home. Electrostatic air cleaners may help.  It may help to replace your pillows or mattress with materials less likely to cause allergies.  If you are not on fluid restriction, drink 8 to 10 glasses of water each day.  Discuss possible exercise routines with your caregiver.  If animal dander is the cause of asthma, you may need to get rid of pets.  It is important that you:  Become educated about your medical condition.  Participate in maintaining wellness.  Seek medical care promptly or immediately as indicated below.  Delay in seeking medical attention could cause permanent injury and may be a risk to your life. SEEK MEDICAL CARE IF  You have wheezing and shortness of breath even if taking medicine to prevent attacks.  An oral temperature above 102 F (38.9 C)  You have muscle aches, chest pain, or thickening of sputum.  Your sputum changes from clear or white to yellow, green, gray, or bloody.  You have any problems that may be related to the medicine you are taking (such as a rash, itching, swelling, or trouble breathing). SEEK IMMEDIATE MEDICAL CARE IF:  Your usual medicines do not stop your wheezing.  There is increased coughing and/or shortness of breath.  You   have increased difficulty breathing. MAKE SURE YOU:   Understand these instructions.  Will watch your condition.  Will get help right away if you are not doing well or get worse. Document  Released: 07/30/2006 Document Revised: 01/04/2012 Document Reviewed: 09/27/2007 [facility] Patient Information 2013 [place], [place].  Tuberculosis Risk Questionnaire  1. Were you born outside the [place] in one of the following parts of the world:    [place], [place], [place], [place] or [place]?  No  2. Have you traveled outside the [place] and lived for more than one month in one of the following parts of the world:  [place], [place], [place], [place] or [place]?  No  3. Do you have a compromised immune system such as from any of the following conditions:  HIV/AIDS, organ or bone marrow transplantation, diabetes, immunosuppressive   medicines (e.g. Prednisone, Remicaide), leukemia, lymphoma, cancer of the   head or neck, gastrectomy or jejunal bypass, end-stage renal disease (on   dialysis), or silicosis?  No    4. Have you ever done one of the following:    Used crack cocaine, injected illegal drugs, worked or resided in jail or prison,   worked or resided at a homeless shelter, or worked as a Research scientist (physical sciences) in   direct contact with patients?  No  5. Have you ever been exposed to anyone with infectious tuberculosis?  No   Tuberculosis Symptom Questionnaire  Do you currently have any of the following symptoms?  1. Unexplained cough lasting more than 3 weeks? Yes   Unexplained fever lasting more than 3 weeks. No   3. Night Sweats (sweating that leaves the bedclothes and sheets wet)   No  4. Shortness of Breath No  5. Chest Pain No  6. Unintentional weight loss  No  7. Unexplained fatigue (very tired for no reason) Yes

## 2012-08-18 LAB — QUANTIFERON TB GOLD ASSAY (BLOOD): Interferon Gamma Release Assay: NEGATIVE

## 2012-08-19 ENCOUNTER — Encounter: Payer: Self-pay | Admitting: *Deleted

## 2012-08-21 ENCOUNTER — Ambulatory Visit (INDEPENDENT_AMBULATORY_CARE_PROVIDER_SITE_OTHER): Payer: Medicare Other | Admitting: Internal Medicine

## 2012-08-21 VITALS — BP 158/84 | HR 82 | Temp 97.9°F | Resp 18 | Ht 63.75 in | Wt 160.0 lb

## 2012-08-21 DIAGNOSIS — J9801 Acute bronchospasm: Secondary | ICD-10-CM

## 2012-08-21 DIAGNOSIS — J189 Pneumonia, unspecified organism: Secondary | ICD-10-CM

## 2012-08-21 NOTE — Progress Notes (Signed)
  Subjective:    Patient ID: Connie Mccann, female    DOB: 02-11-1931, 78 y.o.   MRN: [id number]  HPI Much improved Bronchospasm and sob resolved. HTN taking meds  Reviewed all medes with her  Review of Systems     Objective:   Physical Exam Lungs clear, oximetry 98% Heart nl       Assessment & Plan:  RF meds 1 yr Schedule cpe with one of our new docs next door

## 2012-08-21 NOTE — Patient Instructions (Addendum)
Bronchospasm, Adult  Bronchospasm means that there is a spasm or tightening of the airways going into the lungs. Because the airways go into a spasm and get smaller it makes breathing more difficult.  For reasons not completely known, workings (functions) of the airways designed to protect the lungs become over active. This causes the airways to become more sensitive to:   Infection.   Weather.   Exercise.   Irritants.   Things that cause allergic reactions or allergies (allergens).  Frequent coughing or respiratory episodes should be checked for the cause. This condition may be made worse by exercise.  CAUSES   Inflammation is often the cause of this condition. Allergy, viral respiratory infections, or irritants in the air often cause this problem. Allergic reactions produce immediate and delayed responses. Late reactions may produce more serious inflammation. This may lead to increased reactivity of the airways. Sometimes this is inherited.  Some common triggers are:   Allergies.   Infection commonly triggers attacks. Antibiotics are not helpful for viral infections and usually do not help with attacks of bronchospasm.   Exercise (running, etc.) can trigger an attack. Proper pre-exercise medications help most individuals participate in sports. Swimming is the least likely sport to cause problems.   Irritants (for example, pollution, cigarette smoke, strong odors, aerosol sprays, paint fumes, etc.) may trigger attacks. You cannot smoke and do not allow smoking in your home. This is absolutely necessary. Show this instruction to mates, relatives and significant others that may not agree with you.   Weather changes may cause lung problems but moving around trying to find an ideal climate does not seem to be overly helpful. Winds increase molds and pollens in the air. Rain refreshes the air by washing irritants out. Cold air may cause irritation.   Emotional problems do not cause lung problems but can  trigger attacks.  SYMPTOMS   Wheezing is the most common symptom. Frequent coughing (with or without exercise and or crying) and repeated respiratory infections are all early warning signs of bronchospasm. Chest tightness and shortness of breath are other symptoms.  DIAGNOSIS   Early hidden bronchospasm may go for long periods of time without being detected. This is especially true if wheezing cannot be detected by your caregiver. Lung (pulmonary) function studies may help with diagnosis in these cases.  HOME CARE INSTRUCTIONS    It is necessary to remain calm during an attack. Try to relax and breathe more slowly. During this time medications may be given. If any breathing problems seem to be getting worse and are unresponsive to treatment seek immediate medical care.   If you have severe breathing difficulty or have had a life threatening attack it is probably a good idea for you to learn how to give adrenaline (epi-pen) or use an anaphylaxis kit. Your caregiver can help you with this. These are the same kits carried by people who have severe allergic reactions. This is especially important if you do not have readily accessible medical care.   With any severe breathing problems where epinephrine (adrenaline) has been given at home call 911 immediately as the delayed reaction may be even more severe.  SEEK MEDICAL CARE IF:    There is wheezing and shortness of breath, even if medications are given to prevent attacks.   An oral temperature above 102 F (38.9 C) develops.   There are muscle aches, chest pain, or thickening of sputum.   The sputum changes from clear or white to yellow, green,   gray, or bloody.   There are problems that may be related to the medicine you are given, such as a rash, itching, swelling, or trouble breathing.  SEEK IMMEDIATE MEDICAL CARE IF:    The usual medicines do not stop your wheezing, or there is increased coughing.   You have increased difficulty breathing.  MAKE SURE YOU:     Understand these instructions.   Will watch your condition.   Will get help right away if you are not doing well or get worse.  Document Released: 10/15/2003 Document Revised: 01/04/2012 Document Reviewed: 05/30/2008  [employer] Patient Information 2013 [employer].

## 2012-09-02 ENCOUNTER — Ambulatory Visit (INDEPENDENT_AMBULATORY_CARE_PROVIDER_SITE_OTHER): Payer: Medicare Other | Admitting: Family Medicine

## 2012-09-02 ENCOUNTER — Encounter: Payer: Self-pay | Admitting: Family Medicine

## 2012-09-02 VITALS — BP 159/74 | HR 70 | Temp 97.8°F | Resp 16 | Ht 64.0 in | Wt 160.0 lb

## 2012-09-02 DIAGNOSIS — K219 Gastro-esophageal reflux disease without esophagitis: Secondary | ICD-10-CM

## 2012-09-02 DIAGNOSIS — Z0000 Encounter for general adult medical examination without abnormal findings: Secondary | ICD-10-CM

## 2012-09-02 DIAGNOSIS — Z79899 Other long term (current) drug therapy: Secondary | ICD-10-CM

## 2012-09-02 DIAGNOSIS — I10 Essential (primary) hypertension: Secondary | ICD-10-CM

## 2012-09-02 DIAGNOSIS — Z23 Encounter for immunization: Secondary | ICD-10-CM

## 2012-09-02 LAB — COMPREHENSIVE METABOLIC PANEL
AST: 18 U/L (ref 0–37)
Albumin: 4.4 g/dL (ref 3.5–5.2)
BUN: 12 mg/dL (ref 6–23)
CO2: 26 mEq/L (ref 19–32)
Calcium: 10.1 mg/dL (ref 8.4–10.5)
Chloride: 104 mEq/L (ref 96–112)
Creat: 0.79 mg/dL (ref 0.50–1.10)
Glucose, Bld: 89 mg/dL (ref 70–99)
Potassium: 4.4 mEq/L (ref 3.5–5.3)

## 2012-09-02 LAB — LIPID PANEL
Cholesterol: 180 mg/dL (ref 0–200)
Triglycerides: 64 mg/dL (ref <150)
VLDL: 13 mg/dL (ref 0–40)

## 2012-09-02 NOTE — Progress Notes (Signed)
Subjective:    Connie Mccann is a 78 y.o. female who presents for Medicare Annual/Subsequent preventive examination.  Preventive Screening-Counseling & Management  Tobacco History  Smoking status  . Never Smoker   Smokeless tobacco  . Not on file     Problems Prior to Visit 1.   Current Problems (verified) Patient Active Problem List  Diagnosis  . HTN (hypertension)  . GERD (gastroesophageal reflux disease) ??    Medications Prior to Visit Current Outpatient Prescriptions on File Prior to Visit  Medication Sig Dispense Refill  . albuterol (PROVENTIL HFA;VENTOLIN HFA) 108 (90 BASE) MCG/ACT inhaler Inhale 2 puffs into the lungs every 6 (six) hours as needed for wheezing.  1 Inhaler  3  . amLODipine (NORVASC) 10 MG tablet Take 1 tablet (10 mg total) by mouth daily.  90 tablet  1  . beclomethasone (QVAR) 80 MCG/ACT inhaler Inhale 1 puff into the lungs 2 (two) times daily.  1 Inhaler  1  . dextromethorphan-guaiFENesin (MUCINEX DM) 30-600 MG per 12 hr tablet Take 1 tablet by mouth every 12 (twelve) hours.      Marland Kitchen HYDROcodone-acetaminophen (LORTAB) 7.5-500 MG/15ML solution Take 5 mLs by mouth every 6 (six) hours as needed for pain.  240 mL  0  . Multiple Vitamin (MULTIVITAMIN) tablet Take 1 tablet by mouth daily.      . Multiple Vitamins-Minerals (OCUVITE EYE + MULTI) TABS Take 1 tablet by mouth daily.      Marland Kitchen omeprazole (PRILOSEC) 40 MG capsule Take 1 capsule (40 mg total) by mouth daily.  90 capsule  1  . ipratropium (ATROVENT HFA) 17 MCG/ACT inhaler Inhale 2 puffs into the lungs 4 (four) times daily.  1 Inhaler  0  . meloxicam (MOBIC) 7.5 MG tablet Take 1 tablet (7.5 mg total) by mouth daily.  30 tablet  0  . methylPREDNISolone (MEDROL) 4 MG tablet 2 daily  10 tablet  0  . predniSONE (DELTASONE) 20 MG tablet Take 2 tablets by mouth each day for 5 days.  10 tablet  0    Current Medications (verified) Current Outpatient Prescriptions  Medication Sig Dispense Refill  . albuterol  (PROVENTIL HFA;VENTOLIN HFA) 108 (90 BASE) MCG/ACT inhaler Inhale 2 puffs into the lungs every 6 (six) hours as needed for wheezing.  1 Inhaler  3  . amLODipine (NORVASC) 10 MG tablet Take 1 tablet (10 mg total) by mouth daily.  90 tablet  1  . beclomethasone (QVAR) 80 MCG/ACT inhaler Inhale 1 puff into the lungs 2 (two) times daily.  1 Inhaler  1  . dextromethorphan-guaiFENesin (MUCINEX DM) 30-600 MG per 12 hr tablet Take 1 tablet by mouth every 12 (twelve) hours.      Marland Kitchen HYDROcodone-acetaminophen (LORTAB) 7.5-500 MG/15ML solution Take 5 mLs by mouth every 6 (six) hours as needed for pain.  240 mL  0  . Multiple Vitamin (MULTIVITAMIN) tablet Take 1 tablet by mouth daily.      . Multiple Vitamins-Minerals (OCUVITE EYE + MULTI) TABS Take 1 tablet by mouth daily.      Marland Kitchen omeprazole (PRILOSEC) 40 MG capsule Take 1 capsule (40 mg total) by mouth daily.  90 capsule  1  . ipratropium (ATROVENT HFA) 17 MCG/ACT inhaler Inhale 2 puffs into the lungs 4 (four) times daily.  1 Inhaler  0  . meloxicam (MOBIC) 7.5 MG tablet Take 1 tablet (7.5 mg total) by mouth daily.  30 tablet  0  . methylPREDNISolone (MEDROL) 4 MG tablet 2 daily  10  tablet  0  . predniSONE (DELTASONE) 20 MG tablet Take 2 tablets by mouth each day for 5 days.  10 tablet  0     Allergies (verified) Review of patient's allergies indicates no known allergies.   PAST HISTORY  Family History No family history on file.  Social History History  Substance Use Topics  . Smoking status: Never Smoker   . Smokeless tobacco: Not on file  . Alcohol Use: No     Are there smokers in your home (other than you)? No  Risk Factors Current exercise habits: Home exercise routine includes treadmill.  Dietary issues discussed: low salt   Cardiac risk factors: advanced age (older than 32 for men, 32 for women) and hypertension.  Depression Screen (Note: if answer to either of the following is "Yes", a more complete depression screening is indicated)    Over the past two weeks, have you felt down, depressed or hopeless? No  Over the past two weeks, have you felt little interest or pleasure in doing things? No  Have you lost interest or pleasure in daily life? No  Do you often feel hopeless? No  Do you cry easily over simple problems? No  Activities of Daily Living In your present state of health, do you have any difficulty performing the following activities?:  Driving? No Managing money?  No Feeding yourself? No Getting from bed to chair? No Climbing a flight of stairs? No Preparing food and eating?: No Bathing or showering? No Getting dressed: No Getting to the toilet? No Using the toilet:No Moving around from place to place: No In the past year have you fallen or had a near fall?:No   Are you sexually active?  Yes  Do you have more than one partner?  No  Hearing Difficulties: No Do you often ask people to speak up or repeat themselves? No Do you experience ringing or noises in your ears? No Do you have difficulty understanding soft or whispered voices? No   Do you feel that you have a problem with memory? No  Do you often misplace items? No  Do you feel safe at home?  Yes  Cognitive Testing  Alert? Yes  Normal Appearance?Yes  Oriented to person? Yes  Place? Yes   Time? Yes  Recall of three objects?  No - pt could recall 2 out of the 3  items  Can perform simple calculations? No  Displays appropriate judgment?Yes  Can read the correct time from a watch face?Yes   Advanced Directives have been discussed with the patient? Yes  List the Names of Other Physician/Practitioners you currently use: 1.  None  Indicate any recent Medical Services you may have received from other than [facility] providers in the past year (date may be approximate). None  There is no immunization history on file for this patient.  Screening Tests Health Maintenance  Topic Date Due  . Tetanus/tdap  06/14/1952  . Colonoscopy  06/15/1983  .  Zostavax  06/14/1993  . Pneumococcal Polysaccharide Vaccine Age 79 And Over  06/14/1998  . Influenza Vaccine  06/26/2012    All answers were reviewed with the patient and necessary referrals were made:  [facility], [place]   09/02/2012   History reviewed: allergies, current medications, past family history, past medical history, past social history, past surgical history and problem list  Review of Systems A comprehensive review of systems was negative.    Objective:      Body mass index is 27.46 kg/(m^2). BP  159/74  Pulse 70  Temp 97.8 F (36.6 C)  Resp 16  Ht 5\' 4"  (1.626 m)  Wt 160 lb (72.576 kg)  BMI 27.46 kg/m2  BP 159/74  Pulse 70  Temp 97.8 F (36.6 C)  Resp 16  Ht 5\' 4"  (1.626 m)  Wt 160 lb (72.576 kg)  BMI 27.46 kg/m2 General appearance: alert, cooperative and appears stated age Head: Normocephalic, without obvious abnormality, atraumatic Ears: normal TM's and external ear canals both ears Nose: Nares normal. Septum midline. Mucosa normal. No drainage or sinus tenderness. Throat: normal findings: lips normal without lesions, tongue midline and normal and oropharynx pink & moist without lesions or evidence of thrush Neck: no adenopathy, supple, symmetrical, trachea midline and thyroid not enlarged, symmetric, no tenderness/mass/nodules Lungs: clear to auscultation bilaterally Heart: regular rate and rhythm, S1, S2 normal, no murmur, click, rub or gallop Extremities: extremities normal, atraumatic, no cyanosis or edema Pulses: 2+ and symmetric Lymph nodes: Cervical adenopathy: normal     Assessment:     Annual Wellness physical     Plan:     During the course of the visit the patient was educated and counseled about appropriate screening and preventive services including:    Pneumococcal vaccine   Influenza vaccine  Diabetes screening  Advanced directives: has NO advanced directive - not interested in additional information Due to pt's age, there is no  need fro screening pap tests.  Will discuss mammography, colonoscopy, DEXA at f/u.  Check FLP and glucose Diet review for nutrition referral? Yes ____  Not Indicated _x___   Patient Instructions (the written plan) was given to the patient.  Medicare Attestation I have personally reviewed: The patient's medical and social history Their use of alcohol, tobacco or illicit drugs Their current medications and supplements The patient's functional ability including ADLs,fall risks, home safety risks, cognitive, and hearing and visual impairment Diet and physical activities Evidence for depression or mood disorders  The patient's weight, height, BMI, have been recorded in the chart.  I have made referrals, counseling, and provided education to the patient based on review of the above and I have provided the patient with a written personalized care plan for preventive services.     Norberto Sorenson, MD   09/02/2012

## 2012-09-08 NOTE — Progress Notes (Signed)
Reviewed and agree.

## 2012-12-02 ENCOUNTER — Ambulatory Visit (INDEPENDENT_AMBULATORY_CARE_PROVIDER_SITE_OTHER): Payer: Medicare Other | Admitting: Family Medicine

## 2012-12-02 VITALS — BP 158/81 | HR 77 | Temp 97.8°F | Resp 16 | Ht 63.5 in | Wt 156.0 lb

## 2012-12-02 DIAGNOSIS — J45909 Unspecified asthma, uncomplicated: Secondary | ICD-10-CM

## 2012-12-02 DIAGNOSIS — I10 Essential (primary) hypertension: Secondary | ICD-10-CM

## 2012-12-02 LAB — PULMONARY FUNCTION TEST

## 2012-12-02 MED ORDER — AMLODIPINE BESY-BENAZEPRIL HCL 10-20 MG PO CAPS: 1.0000 | ORAL_CAPSULE | Freq: Every day | ORAL | Status: DC

## 2012-12-02 NOTE — Patient Instructions (Addendum)
Asthma, F.L.A.R.E.  Most people with asthma do not get so sick that they need emergency care. The fact that you had to get emergency care may mean:  · You are not taking your long-term control medicine the right way.  · You have not been prescribed any or enough long-term control medicine.  · You are still exposed to triggers that start your asthma symptoms.  You can avoid asthma flare-ups by using this F.L.A.R.E. plan until you see your primary doctor.  F  FOLLOW UP WITH YOUR PRIMARY DOCTOR - CALL TO MAKE AN APPOINTMENT TO BE SEEN.  · If you have trouble making an appointment, ask to speak to the office nurse.  · If you do not have a primary care doctor, call to get one.  At the follow-up appointment:  · Bring all of your medications and this plan with you.  · Make an asthma action plan with your doctor that you can follow every day to keep your asthma under control.  · Write down your questions and your doctor's answers.  This will make your emergency visits rare.  L  LEARN ABOUT YOUR ASTHMA MEDICINES. TAKE ALL OF THESE MEDICINES JUST AS THE DOCTOR TELLS YOU, EVEN IF YOU ARE FEELING MUCH BETTER.  Quick-relief or Rescue  · Kind of medicine  · Name of medicine  · How much  · How often & how long you need to take it  Long-term control  · Kind of medicine  · Name of medicine  · How much  · How often & how long you need to take it  Steroid pills or syrup  · Kind of medicine  · Name of medicine  · How much  · How often & how long you need to take it  A  ASTHMA IS A LIFE-LONG (CHRONIC) DISEASE.  Even though your breathing is better after getting emergency care, you still need to get long-term control of your asthma. If you do not, you are at risk for more severe flare-ups and even death.  · If you use quick-relief medicine more than 2 times per week, your asthma is not under control. You need to see your doctor or an asthma specialist to make a plan to get control of your asthma.  · Take long-term control medicine every  day as ordered by your doctor.  · Figure out what things make your asthma flare up and try to stay away from these "triggers."  R  RESPOND TO THESE WARNING SIGNS THAT YOUR ASTHMA IS GETTING WORSE:  · Your chest feels tight.  · You are short of breath.  · You are wheezing.  · You are coughing.  · Your peak flow is getting low.  Keep taking your medicines as prescribed and call your doctor.  E  EMERGENCY CARE MAY BE NEEDED IF YOU:  · Have trouble talking.  · Are working hard to breathe (may see skin sucking in at rib cage or above breast bone).  · Need to use quick-relief medicine more often than every 4 hours.  · See your peak flow dropping.  Take your quick-relief medicine and wait 20 minutes. If you do not feel better, take it again and wait 20 minutes. If you still do not feel better, take it again and call your doctor or 911 right away!  LEARN ABOUT ASTHMA  Asthma is a life-long disease that can make it hard for you to get air in and out of your lungs. Your   Smaller breathing tubes then get clogged with the extra mucus.  Swelling, muscle tightness, and mucus make it harder for you to breathe. You start to cough and wheeze and your chest might feel tight. Not all asthma flare-ups are the same. Some are worse than others. In a severe asthma flare-up, the breathing tubes get so small that air cannot get in and out of the lungs. People can die if their asthma flare-up is severe. ASTHMA MEDICINES  Quick-relief or Rescue medicine: should help for about 4 hours, relaxes the muscles around your breathing tubes so air can get in and out. If you need to take quick-relief medicine more than 2 times per week, your asthma is not  under control, and you should ask your doctor about long-term control medicine.  Long-term control medicine: must be taken every day in order to work right. It keeps your breathing tubes from swelling, and can prevent most asthma flare-ups. This medicine cannot stop a flare-up once it starts. During flare-ups, use quick-relief medicine right away and take your long-term control medicine as usual.  Steroid pills or syrup: can help the swelling in your breathing tubes go away. You must take this medicine just as the doctor tells you to. Do not skip a dose, and do not stop taking it unless a doctor tells you to stop. TRIGGERS: TELL YOUR DOCTOR ABOUT THE THINGS THAT MAKE YOUR ASTHMA WORSE. What started or triggered your asthma flare-up this time? COMMON ASTHMA TRIGGERS:  Breathing in chemicals, dusts, or fumes at work.  Colds or flu.  Animals.  Dust.  Pollen and mold.  Strong odors.  Weather.  Exercise.  Cigarette and other smoke.  Medicines. Smoking and secondhand smoke are asthma triggers. If you smoke, choose to quit. Never let others smoke near you or your children. Call your doctor or your health plan for help quitting. FOR MORE INFORMATION Ast[employer] Initiative of [place]: www.https://bond-evans.com/ American Lung Association: www.lungusa.org Asthma and Allergy Foundation of America: www.aafa.org This plan and asthma information are based on the NAEPP Guidelines for the Diagnosis and Management of Asthma, (ShinProtection.co.uk). Document Released: 05/20/2006 Document Revised: 01/04/2012 Document Reviewed: 07/29/2006 [facility] Patient Information 2013 [place], [place].   Asthma Attack Prevention HOW CAN ASTHMA BE PREVENTED? Currently, there is no way to prevent asthma from starting. However, you can take steps to control the disease and prevent its symptoms after you have been diagnosed. Learn about your asthma and how to control it. Take an active  role to control your asthma by working with your caregiver to create and follow an asthma action plan. An asthma action plan guides you in taking your medicines properly, avoiding factors that make your asthma worse, tracking your level of asthma control, responding to worsening asthma, and seeking emergency care when needed. To track your asthma, keep records of your symptoms, check your peak flow number using a peak flow meter (handheld device that shows how well air moves out of your lungs), and get regular asthma checkups.  Other ways to prevent asthma attacks include:  Use medicines as your caregiver directs.  Identify and avoid things that make your asthma worse (as much as you can).  Keep track of your asthma symptoms and level of control.  Get regular checkups for your asthma.  With your caregiver, write a detailed plan for taking medicines and managing an asthma attack. Then be sure to follow your action plan. Asthma is an ongoing condition that needs regular monitoring and treatment.  Identify and avoid  asthma triggers. A number of outdoor allergens and irritants (pollen, mold, cold air, air pollution) can trigger asthma attacks. Find out what causes or makes your asthma worse, and take steps to avoid those triggers (see below).  Monitor your breathing. Learn to recognize warning signs of an attack, such as slight coughing, wheezing or shortness of breath. However, your lung function may already decrease before you notice any signs or symptoms, so regularly measure and record your peak airflow with a home peak flow meter.  Identify and treat attacks early. If you act quickly, you're less likely to have a severe attack. You will also need less medicine to control your symptoms. When your peak flow measurements decrease and alert you to an upcoming attack, take your medicine as instructed, and immediately stop any activity that may have triggered the attack. If your symptoms do not improve,  get medical help.  Pay attention to increasing quick-relief inhaler use. If you find yourself relying on your quick-relief inhaler (such as albuterol), your asthma is not under control. See your caregiver about adjusting your treatment. IDENTIFY AND CONTROL FACTORS THAT MAKE YOUR ASTHMA WORSE A number of common things can set off or make your asthma symptoms worse (asthma triggers). Keep track of your asthma symptoms for several weeks, detailing all the environmental and emotional factors that are linked with your asthma. When you have an asthma attack, go back to your asthma diary to see which factor, or combination of factors, might have contributed to it. Once you know what these factors are, you can take steps to control many of them.  Allergies: If you have allergies and asthma, it is important to take asthma prevention steps at home. Asthma attacks (worsening of asthma symptoms) can be triggered by allergies, which can cause temporary increased inflammation of your airways. Minimizing contact with the substance to which you are allergic will help prevent an asthma attack. Animal Dander:   Some people are allergic to the flakes of skin or dried saliva from animals with fur or feathers. Keep these pets out of your home.  If you can't keep a pet outdoors, keep the pet out of your bedroom and other sleeping areas at all times, and keep the door closed.  Remove carpets and furniture covered with cloth from your home. If that is not possible, keep the pet away from fabric-covered furniture and carpets. Dust Mites:  Many people with asthma are allergic to dust mites. Dust mites are tiny bugs that are found in every home, in mattresses, pillows, carpets, fabric-covered furniture, bedcovers, clothes, stuffed toys, fabric, and other fabric-covered items.  Cover your mattress in a special dust-proof cover.  Cover your pillow in a special dust-proof cover, or wash the pillow each week in hot water.  Water must be hotter than 130 F to kill dust mites. Cold or warm water used with detergent and bleach can also be effective.  Wash the sheets and blankets on your bed each week in hot water.  Try not to sleep or lie on cloth-covered cushions.  Call ahead when traveling and ask for a smoke-free hotel room. Bring your own bedding and pillows, in case the hotel only supplies feather pillows and down comforters, which may contain dust mites and cause asthma symptoms.  Remove carpets from your bedroom and those laid on concrete, if you can.  Keep stuffed toys out of the bed, or wash the toys weekly in hot water or cooler water with detergent and bleach. Cockroaches:  Many people with asthma are allergic to the droppings and remains of cockroaches.  Keep food and garbage in closed containers. Never leave food out.  Use poison baits, traps, powders, gels, or paste (for example, boric acid).  If a spray is used to kill cockroaches, stay out of the room until the odor goes away. Indoor Mold:  Fix leaky faucets, pipes, or other sources of water that have mold around them.  Clean moldy surfaces with a cleaner that has bleach in it. Pollen and Outdoor Mold:  When pollen or mold spore counts are high, try to keep your windows closed.  Stay indoors with windows closed from late morning to afternoon, if you can. Pollen and some mold spore counts are highest at that time.  Ask your caregiver whether you need to take or increase anti-inflammatory medicine before your allergy season starts. Irritants:   Tobacco smoke is an irritant. If you smoke, ask your caregiver how you can quit. Ask family members to quit smoking, too. Do not allow smoking in your home or car.  If possible, do not use a wood-burning stove, kerosene heater, or fireplace. Minimize exposure to all sources of smoke, including incense, candles, fires, and fireworks.  Try to stay away from strong odors and sprays, such as perfume,  talcum powder, hair spray, and paints.  Decrease humidity in your home and use an indoor air cleaning device. Reduce indoor humidity to below 60 percent. Dehumidifiers or central air conditioners can do this.  Try to have someone else vacuum for you once or twice a week, if you can. Stay out of rooms while they are being vacuumed and for a short while afterward.  If you vacuum, use a dust mask from a hardware store, a double-layered or microfilter vacuum cleaner bag, or a vacuum cleaner with a HEPA filter.  Sulfites in foods and beverages can be irritants. Do not drink beer or wine, or eat dried fruit, processed potatoes, or shrimp if they cause asthma symptoms.  Cold air can trigger an asthma attack. Cover your nose and mouth with a scarf on cold or windy days.  Several health conditions can make asthma more difficult to manage, including runny nose, sinus infections, reflux disease, psychological stress, and sleep apnea. Your caregiver will treat these conditions, as well.  Avoid close contact with people who have a cold or the flu, since your asthma symptoms may get worse if you catch the infection from them. Wash your hands thoroughly after touching items that may have been handled by people with a respiratory infection.  Get a flu shot every year to protect against the flu virus, which often makes asthma worse for days or weeks. Also get a pneumonia shot once every five to 10 years. Drugs:  Aspirin and other painkillers can cause asthma attacks. 10% to 20% of people with asthma have sensitivity to aspirin or a group of painkillers called non-steroidal anti-inflammatory drugs (NSAIDS), such as ibuprofen and naproxen. These drugs are used to treat pain and reduce fevers. Asthma attacks caused by any of these medicines can be severe and even fatal. These drugs must be avoided in people who have known aspirin sensitive asthma. Products with acetaminophen are considered safe for people who have  asthma. It is important that people with aspirin sensitivity read labels of all over-the-counter drugs used to treat pain, colds, coughs, and fever.  Beta blockers and ACE inhibitors are other drugs which you should discuss with your caregiver, in relation to your  asthma. ALLERGY SKIN TESTING  Ask your asthma caregiver about allergy skin testing or blood testing (RAST test) to identify the allergens to which you are sensitive. If you are found to have allergies, allergy shots (immunotherapy) for asthma may help prevent future allergies and asthma. With allergy shots, small doses of allergens (substances to which you are allergic) are injected under your skin on a regular schedule. Over a period of time, your body may become used to the allergen and less responsive with asthma symptoms. You can also take measures to minimize your exposure to those allergens. EXERCISE  If you have exercise-induced asthma, or are planning vigorous exercise, or exercise in cold, humid, or dry environments, prevent exercise-induced asthma by following your caregiver's advice regarding asthma treatment before exercising. Document Released: 09/30/2009 Document Revised: 01/04/2012 Document Reviewed: 09/30/2009 [facility] Patient Information 2013 [place], [place].

## 2012-12-02 NOTE — Progress Notes (Signed)
  Subjective:    Patient ID: Connie Mccann, female    DOB: [birth year], 79 y.o.   MRN: [id number] Chief Complaint  Patient presents with  . Follow-up  . Medication Refill    HPI occasionally checks BP at home and in 140s.    Review of Systems    BP 158/81  Pulse 77  Temp(Src) 97.8 F (36.6 C)  Resp 16  Ht 5' 3.5" (1.613 m)  Wt 156 lb (70.761 kg)  BMI 27.2 kg/m2  PF 325 L/min Objective:   Physical Exam       peak flow 325 - predicted peak flow 388 Assessment & Plan:  HTN (hypertension)  RAD (reactive airway disease) - Plan: Pulmonary function test, PR INHAL RX, AIRWAY OBST/DX SPUTUM INDUCT  Meds ordered this encounter  Medications  . amLODipine-benazepril (LOTREL) 10-20 MG per capsule    Sig: Take 1 capsule by mouth daily.    Dispense:  30 capsule    Refill:  0

## 2012-12-23 ENCOUNTER — Ambulatory Visit (INDEPENDENT_AMBULATORY_CARE_PROVIDER_SITE_OTHER): Payer: Medicare Other | Admitting: Family Medicine

## 2012-12-23 ENCOUNTER — Encounter: Payer: Self-pay | Admitting: Family Medicine

## 2012-12-23 VITALS — BP 148/78 | HR 80 | Temp 98.0°F | Resp 16 | Ht 63.0 in | Wt 152.6 lb

## 2012-12-23 DIAGNOSIS — J45909 Unspecified asthma, uncomplicated: Secondary | ICD-10-CM

## 2012-12-23 DIAGNOSIS — K219 Gastro-esophageal reflux disease without esophagitis: Secondary | ICD-10-CM

## 2012-12-23 DIAGNOSIS — I10 Essential (primary) hypertension: Secondary | ICD-10-CM

## 2012-12-23 LAB — BASIC METABOLIC PANEL
CO2: 26 mEq/L (ref 19–32)
Chloride: 103 mEq/L (ref 96–112)
Glucose, Bld: 89 mg/dL (ref 70–99)
Potassium: 4.9 mEq/L (ref 3.5–5.3)
Sodium: 139 mEq/L (ref 135–145)

## 2012-12-23 MED ORDER — IPRATROPIUM BROMIDE HFA 17 MCG/ACT IN AERS: 2.0000 | INHALATION_SPRAY | Freq: Four times a day (QID) | RESPIRATORY_TRACT | Status: DC

## 2012-12-23 MED ORDER — BECLOMETHASONE DIPROPIONATE 80 MCG/ACT IN AERS: 1.0000 | INHALATION_SPRAY | Freq: Two times a day (BID) | RESPIRATORY_TRACT | Status: DC

## 2012-12-23 MED ORDER — OMEPRAZOLE 40 MG PO CPDR: 40.0000 mg | DELAYED_RELEASE_CAPSULE | Freq: Every day | ORAL | Status: DC

## 2012-12-23 MED ORDER — LISINOPRIL 40 MG PO TABS: 40.0000 mg | ORAL_TABLET | Freq: Every day | ORAL | Status: DC

## 2012-12-23 MED ORDER — PEAK FLOW METER DEVI: Status: DC

## 2012-12-23 MED ORDER — AMLODIPINE BESYLATE 10 MG PO TABS: 10.0000 mg | ORAL_TABLET | Freq: Every day | ORAL | Status: DC

## 2012-12-23 MED ORDER — ALBUTEROL SULFATE HFA 108 (90 BASE) MCG/ACT IN AERS: 2.0000 | INHALATION_SPRAY | Freq: Four times a day (QID) | RESPIRATORY_TRACT | Status: DC | PRN

## 2012-12-23 NOTE — Patient Instructions (Addendum)
DASH Diet The DASH diet stands for "Dietary Approaches to Stop Hypertension." It is a healthy eating plan that has been shown to reduce high blood pressure (hypertension) in as little as 14 days, while also possibly providing other significant health benefits. These other health benefits include reducing the risk of breast cancer after menopause and reducing the risk of type 2 diabetes, heart disease, colon cancer, and stroke. Health benefits also include weight loss and slowing kidney failure in patients with chronic kidney disease.  DIET GUIDELINES  Limit salt (sodium). Your diet should contain less than 1500 mg of sodium daily.  Limit refined or processed carbohydrates. Your diet should include mostly whole grains. Desserts and added sugars should be used sparingly.  Include small amounts of heart-healthy fats. These types of fats include nuts, oils, and tub margarine. Limit saturated and trans fats. These fats have been shown to be harmful in the body. CHOOSING FOODS  The following food groups are based on a 2000 calorie diet. See your Registered Dietitian for individual calorie needs. Grains and Grain Products (6 to 8 servings daily)  Eat More Often: Whole-wheat bread, brown rice, whole-grain or wheat pasta, quinoa, popcorn without added fat or salt (air popped).  Eat Less Often: White bread, white pasta, white rice, cornbread. Vegetables (4 to 5 servings daily)  Eat More Often: Fresh, frozen, and canned vegetables. Vegetables may be raw, steamed, roasted, or grilled with a minimal amount of fat.  Eat Less Often/Avoid: Creamed or fried vegetables. Vegetables in a cheese sauce. Fruit (4 to 5 servings daily)  Eat More Often: All fresh, canned (in natural juice), or frozen fruits. Dried fruits without added sugar. One hundred percent fruit juice ( cup [237 mL] daily).  Eat Less Often: Dried fruits with added sugar. Canned fruit in light or heavy syrup. [employer], Fish, and Poultry (2  servings or less daily. One serving is 3 to 4 oz [85-114 g]).  Eat More Often: Ninety percent or leaner ground beef, tenderloin, sirloin. Round cuts of beef, chicken breast, [place] breast. All fish. Grill, bake, or broil your meat. Nothing should be fried.  Eat Less Often/Avoid: Fatty cuts of meat, [place], or chicken leg, thigh, or wing. Fried cuts of meat or fish. Dairy (2 to 3 servings)  Eat More Often: Low-fat or fat-free milk, low-fat plain or light yogurt, reduced-fat or part-skim cheese.  Eat Less Often/Avoid: Milk (whole, 2%).Whole milk yogurt. Full-fat cheeses. Nuts, Seeds, and Legumes (4 to 5 servings per week)  Eat More Often: All without added salt.  Eat Less Often/Avoid: Salted nuts and seeds, canned beans with added salt. Fats and Sweets (limited)  Eat More Often: Vegetable oils, tub margarines without trans fats, sugar-free gelatin. Mayonnaise and salad dressings.  Eat Less Often/Avoid: Coconut oils, palm oils, butter, stick margarine, cream, half and half, cookies, candy, pie. FOR MORE INFORMATION The Dash Diet Eating Plan: www.dashdiet.org Document Released: 10/01/2011 Document Revised: 01/04/2012 Document Reviewed: 10/01/2011 [facility] Patient Information 2013 [place], [place]. Blood Pressure Record Sheet Your blood pressure on this visit to the Emergency Department or Clinic is elevated. This does not necessarily mean you have hypertension, but it does mean that your blood pressure needs to be rechecked. Many times blood pressure increases because of illness, pain, anxiety, or other factors. We recommend that you get a series of blood pressure readings done over a period of about 5 days. It is best to get a reading in the morning and one in the evening. You should make  sure you sit and relax for 1 to 5 minutes before the reading is taken. Write the readings down and make a follow-up appointment with your doctor to discuss the results. If there is not a free clinic or a drug  store with blood pressure taking machines near you, you can purchase good blood pressure taking equipment from a drug store, often for less than the price of a physician's office call. Having one in the home also allows you the convenience of taking your blood pressure while you are home and in relaxed surroundings. Your blood pressure in the Emergency Department or Clinic on ________ was ____________________. BLOOD PRESSURE LOG Date: _______________________  a.m. _____________________  p.m. _____________________ Date: _______________________  a.m. _____________________  p.m. _____________________ Date: _______________________  a.m. _____________________  p.m. _____________________ Date: _______________________  a.m. _____________________  p.m. _____________________ Date: _______________________  a.m. _____________________  p.m. _____________________ Document Released: 07/11/2003 Document Revised: 01/04/2012 Document Reviewed: 10/12/2005 [employer] Patient Information 2013 [place], [place].

## 2012-12-23 NOTE — Progress Notes (Signed)
Subjective:    Patient ID: Connie Mccann, female    DOB: 11-Aug-1931, 79 y.o.   MRN: [id number] Chief Complaint  Patient presents with  . 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  Had 2 allergic reactions - she was eatng some candy - some gummy product - and when she put it in her mouth it made her salivate and mouth felt raw.  She felt more short of breath but went away as soon as she spit it out.  No facial swelling. No problems with seasonal allergies.  No past medical history on file. Current Outpatient Prescriptions on File Prior to Visit  Medication Sig Dispense Refill  . Multiple Vitamins-Minerals (OCUVITE EYE + MULTI) TABS Take 1 tablet by mouth daily.      . Multiple Vitamin (MULTIVITAMIN) tablet Take 1 tablet by mouth daily.       No current facility-administered medications on file prior to visit.   No Known Allergies  Review of Systems  Constitutional: Positive for fatigue. Negative for fever, chills, diaphoresis and appetite change.  HENT: Negative for congestion, facial swelling, rhinorrhea, sneezing, postnasal drip and sinus pressure.   Eyes: Negative for visual disturbance.  Respiratory: Positive for cough and shortness of breath. Negative for wheezing.   Cardiovascular: Negative for chest pain, palpitations and leg swelling.  Genitourinary: Negative for decreased urine volume.  Neurological: Negative for syncope and headaches.  Hematological: Negative for adenopathy. Does not bruise/bleed easily.  Psychiatric/Behavioral: Negative for sleep disturbance.      BP 148/78  Pulse 80  Temp(Src) 98 F (36.7 C) (Oral)  Resp 16  Ht 5\' 3"  (1.6 m)  Wt 152 lb 9.6 oz (69.219 kg)  BMI 27.04 kg/m2  SpO2 99%  PF 350 L/min Objective:   Physical Exam  Constitutional: She is oriented to person, place, and time. She appears well-developed and well-nourished. No  distress.  HENT:  Head: Normocephalic and atraumatic.  Right Ear: External ear normal.  Left Ear: External ear normal.  Eyes: Conjunctivae are normal. No scleral icterus.  Neck: Normal range of motion. Neck supple. No thyromegaly present.  Cardiovascular: Normal rate, regular rhythm, normal heart sounds and intact distal pulses.   Pulmonary/Chest: Effort normal and breath sounds normal. No respiratory distress.  Musculoskeletal: She exhibits no edema.  Lymphadenopathy:    She has no cervical adenopathy.  Neurological: She is alert and oriented to person, place, and time.  Skin: Skin is warm and dry. She is not diaphoretic. No erythema.  Psychiatric: She has a normal mood and affect. Her behavior is normal.     peak flow 350 with goal being 380 Assessment & Plan:  HTN (hypertension) - Plan: Basic Metabolic Panel - refill amlodipine and increase lisinopril.  GERD (gastroesophageal reflux disease) ?? - Plan: Basic Metabolic Panel - do trial of prilosec  Intrinsic asthma - Plan: Peak flow meter, Peak Flow Meter DEVI, Basic Metabolic Panel - peak flow actually fairly close to goal.  Continue scheduled qvar and atrovent with prn albuterol.  Meds ordered this encounter  Medications  . DISCONTD: ipratropium (ATROVENT HFA) 17 MCG/ACT inhaler    Sig: Inhale 2 puffs into the lungs 4 (four) times daily.    Dispense:  1 Inhaler    Refill:  5  . albuterol (PROVENTIL HFA;VENTOLIN HFA) 108 (90 BASE) MCG/ACT inhaler    Sig: Inhale  2 puffs into the lungs every 6 (six) hours as needed for wheezing.    Dispense:  1 Inhaler    Refill:  11  . DISCONTD: beclomethasone (QVAR) 80 MCG/ACT inhaler    Sig: Inhale 1 puff into the lungs 2 (two) times daily.    Dispense:  1 Inhaler    Refill:  5  . omeprazole (PRILOSEC) 40 MG capsule    Sig: Take 1 capsule (40 mg total) by mouth daily.    Dispense:  90 capsule    Refill:  1  . Peak Flow Meter DEVI    Sig: Use as directed to monitor asthma    Dispense:   1 each    Refill:  11  . amLODipine (NORVASC) 10 MG tablet    Sig: Take 1 tablet (10 mg total) by mouth daily.    Dispense:  90 tablet    Refill:  3  . lisinopril (PRINIVIL,ZESTRIL) 40 MG tablet    Sig: Take 1 tablet (40 mg total) by mouth daily.    Dispense:  90 tablet    Refill:  3  . beclomethasone (QVAR) 80 MCG/ACT inhaler    Sig: Inhale 1 puff into the lungs 2 (two) times daily.    Dispense:  3 Inhaler    Refill:  1  . ipratropium (ATROVENT HFA) 17 MCG/ACT inhaler    Sig: Inhale 2 puffs into the lungs 4 (four) times daily.    Dispense:  3 Inhaler    Refill:  3

## 2013-01-01 ENCOUNTER — Telehealth: Payer: Self-pay

## 2013-01-01 NOTE — Telephone Encounter (Signed)
Pt husband called stating that we called his wife this morning about her medication and she has been out for 10 days  Best number [phone]

## 2013-01-02 NOTE — Telephone Encounter (Signed)
What is she out of? She states she is being shipped her mail order.

## 2013-01-30 ENCOUNTER — Telehealth: Payer: Self-pay

## 2013-01-30 NOTE — Telephone Encounter (Signed)
She called back and I have advised it was sent to the [facility].

## 2013-01-30 NOTE — Telephone Encounter (Signed)
This was sent with 11 refills in Feb, to [facility]. Called patient unable to leave message.

## 2013-01-30 NOTE — Telephone Encounter (Signed)
PATIENT IS REQUESTING A REFILL ON ALBUTEROL ASTHMA INHALER. PATIENT NORMALLY SEES DR. SHAW. [phone].

## 2013-06-23 ENCOUNTER — Encounter: Payer: Self-pay | Admitting: Family Medicine

## 2013-06-23 ENCOUNTER — Ambulatory Visit (INDEPENDENT_AMBULATORY_CARE_PROVIDER_SITE_OTHER): Payer: Medicare Other | Admitting: Family Medicine

## 2013-06-23 VITALS — BP 150/80 | HR 81 | Temp 98.0°F | Resp 16 | Ht 63.0 in | Wt 152.0 lb

## 2013-06-23 DIAGNOSIS — Z23 Encounter for immunization: Secondary | ICD-10-CM

## 2013-06-23 DIAGNOSIS — I10 Essential (primary) hypertension: Secondary | ICD-10-CM

## 2013-06-23 DIAGNOSIS — Z79899 Other long term (current) drug therapy: Secondary | ICD-10-CM

## 2013-06-23 MED ORDER — LOSARTAN POTASSIUM 50 MG PO TABS: 50.0000 mg | ORAL_TABLET | Freq: Every day | ORAL | Status: DC

## 2013-06-23 MED ORDER — AMLODIPINE BESYLATE 10 MG PO TABS: 10.0000 mg | ORAL_TABLET | Freq: Every day | ORAL | Status: DC

## 2013-06-23 NOTE — Progress Notes (Signed)
Subjective:    Patient ID: Connie Mccann, female    DOB: November 29, 1930, 79 y.o.   MRN: [id number] Chief Complaint  Patient presents with  . Hypertension  . Medication Refill    amlodipine    HPI  BP variable at home.  BP was elevated last night after working the whole day - was up to 191 but this morning was down to 181.  About 1/2 of the time it is 120-130 systolic.  Not taking lisinopril - something happens when she takes it - points to her upper stomach - just doesn't agree with her.  Checks her BP about twice a day every other day.  Doing well on her inhalers - uses atrovent twice a day.  Celebrated her 82th birthday this month.   No past medical history on file. Current Outpatient Prescriptions on File Prior to Visit  Medication Sig Dispense Refill  . albuterol (PROVENTIL HFA;VENTOLIN HFA) 108 (90 BASE) MCG/ACT inhaler Inhale 2 puffs into the lungs every 6 (six) hours as needed for wheezing.  1 Inhaler  11  . beclomethasone (QVAR) 80 MCG/ACT inhaler Inhale 1 puff into the lungs 2 (two) times daily.  3 Inhaler  1  . ipratropium (ATROVENT HFA) 17 MCG/ACT inhaler Inhale 2 puffs into the lungs 4 (four) times daily.  3 Inhaler  3  . Multiple Vitamin (MULTIVITAMIN) tablet Take 1 tablet by mouth daily.      . Multiple Vitamins-Minerals (OCUVITE EYE + MULTI) TABS Take 1 tablet by mouth daily.      . Peak Flow Meter DEVI Use as directed to monitor asthma  1 each  11   No current facility-administered medications on file prior to visit.   No Known Allergies   Review of Systems  Constitutional: Negative for fever, chills, diaphoresis and appetite change.  Eyes: Negative for visual disturbance.  Respiratory: Negative for cough and shortness of breath.   Cardiovascular: Negative for chest pain, palpitations and leg swelling.  Genitourinary: Negative for decreased urine volume.  Neurological: Negative for syncope and headaches.  Hematological: Does not bruise/bleed easily.      BP  150/80  Pulse 81  Temp(Src) 98 F (36.7 C) (Oral)  Resp 16  Ht 5\' 3"  (1.6 m)  Wt 152 lb (68.947 kg)  BMI 26.93 kg/m2  SpO2 98% Objective:   Physical Exam  Constitutional: She is oriented to person, place, and time. She appears well-developed and well-nourished. No distress.  HENT:  Head: Normocephalic and atraumatic.  Right Ear: External ear normal.  Left Ear: External ear normal.  Eyes: Conjunctivae are normal. No scleral icterus.  Neck: Normal range of motion. Neck supple. No thyromegaly present.  Cardiovascular: Normal rate, regular rhythm, normal heart sounds and intact distal pulses.   Pulmonary/Chest: Effort normal and breath sounds normal. No respiratory distress.  Musculoskeletal: She exhibits no edema.  Lymphadenopathy:    She has no cervical adenopathy.  Neurological: She is alert and oriented to person, place, and time.  Skin: Skin is warm and dry. She is not diaphoretic. No erythema.  Psychiatric: She has a normal mood and affect. Her behavior is normal.          Assessment & Plan:  HTN (hypertension) - Plan: Lipid panel, Comprehensive metabolic panel, TSH - rec bring BP cuff to f/u for review and comparison. Most of the time BP at goal so cont amlodipine 10 mg, however will try losartan 50mg  if BP spikes as did last night.  Encounter for long-term (current)  use of other medications - Plan: Lipid panel, Comprehensive metabolic panel, TSH  Need for prophylactic vaccination and inoculation against influenza - Plan: Flu Vaccine QUAD 36+ mos IM  Sched AWV for f/u.  Meds ordered this encounter  Medications  . amLODipine (NORVASC) 10 MG tablet    Sig: Take 1 tablet (10 mg total) by mouth daily.    Dispense:  90 tablet    Refill:  3  . losartan (COZAAR) 50 MG tablet    Sig: Take 1 tablet (50 mg total) by mouth daily. Take as needed for blood pressure >150    Dispense:  90 tablet    Refill:  0

## 2013-06-24 ENCOUNTER — Encounter: Payer: Self-pay | Admitting: Family Medicine

## 2013-06-24 LAB — LIPID PANEL
HDL: 65 mg/dL (ref >39)
LDL Cholesterol: 129 mg/dL — ABNORMAL HIGH (ref 0–99)

## 2013-06-24 LAB — COMPREHENSIVE METABOLIC PANEL
ALT: 19 U/L (ref 0–35)
Albumin: 4.7 g/dL (ref 3.5–5.2)
CO2: 26 mEq/L (ref 19–32)
Calcium: 10 mg/dL (ref 8.4–10.5)
Chloride: 103 mEq/L (ref 96–112)
Glucose, Bld: 86 mg/dL (ref 70–99)
Potassium: 4 mEq/L (ref 3.5–5.3)
Sodium: 139 mEq/L (ref 135–145)
Total Bilirubin: 0.7 mg/dL (ref 0.3–1.2)
Total Protein: 7.3 g/dL (ref 6.0–8.3)

## 2013-06-24 LAB — TSH: TSH: 1.965 u[IU]/mL (ref 0.350–4.500)

## 2013-07-07 ENCOUNTER — Encounter: Payer: Self-pay | Admitting: Radiology

## 2013-07-07 DIAGNOSIS — E785 Hyperlipidemia, unspecified: Secondary | ICD-10-CM

## 2013-08-04 ENCOUNTER — Ambulatory Visit (INDEPENDENT_AMBULATORY_CARE_PROVIDER_SITE_OTHER): Payer: Medicare Other | Admitting: Family Medicine

## 2013-08-04 ENCOUNTER — Encounter: Payer: Self-pay | Admitting: Family Medicine

## 2013-08-04 VITALS — BP 160/80 | HR 82 | Temp 98.1°F | Resp 16 | Ht 63.5 in | Wt 152.0 lb

## 2013-08-04 DIAGNOSIS — Z1211 Encounter for screening for malignant neoplasm of colon: Secondary | ICD-10-CM

## 2013-08-04 DIAGNOSIS — I10 Essential (primary) hypertension: Secondary | ICD-10-CM

## 2013-08-04 DIAGNOSIS — Z0000 Encounter for general adult medical examination without abnormal findings: Secondary | ICD-10-CM

## 2013-08-04 NOTE — Patient Instructions (Signed)

## 2013-08-04 NOTE — Progress Notes (Signed)
Subjective:    Connie Mccann is a 79 y.o. female who presents for Medicare Annual/Subsequent preventive examination.  Preventive Screening-Counseling & Management  Tobacco History  Smoking status  . Never Smoker   Smokeless tobacco  . Not on file     Problems Prior to Visit 1. HTN - continues to be labile - usually runs about 130s systolic. Does check outside the office.  Current Problems (verified) Patient Active Problem List   Diagnosis Date Noted  . Other and unspecified hyperlipidemia 07/07/2013  . HTN (hypertension) 05/21/2012  . GERD (gastroesophageal reflux disease) ?? 05/21/2012    Medications Prior to Visit Current Outpatient Prescriptions on File Prior to Visit  Medication Sig Dispense Refill  . albuterol (PROVENTIL HFA;VENTOLIN HFA) 108 (90 BASE) MCG/ACT inhaler Inhale 2 puffs into the lungs every 6 (six) hours as needed for wheezing.  1 Inhaler  11  . amLODipine (NORVASC) 10 MG tablet Take 1 tablet (10 mg total) by mouth daily.  90 tablet  3  . ipratropium (ATROVENT HFA) 17 MCG/ACT inhaler Inhale 2 puffs into the lungs 4 (four) times daily.  3 Inhaler  3  . losartan (COZAAR) 50 MG tablet Take 1 tablet (50 mg total) by mouth daily. Take as needed for blood pressure >150  90 tablet  0  . Multiple Vitamin (MULTIVITAMIN) tablet Take 1 tablet by mouth daily.      . Multiple Vitamins-Minerals (OCUVITE EYE + MULTI) TABS Take 1 tablet by mouth daily.      . Peak Flow Meter DEVI Use as directed to monitor asthma  1 each  11  . beclomethasone (QVAR) 80 MCG/ACT inhaler Inhale 1 puff into the lungs 2 (two) times daily.  3 Inhaler  1   No current facility-administered medications on file prior to visit.    Current Medications (verified) Current Outpatient Prescriptions  Medication Sig Dispense Refill  . albuterol (PROVENTIL HFA;VENTOLIN HFA) 108 (90 BASE) MCG/ACT inhaler Inhale 2 puffs into the lungs every 6 (six) hours as needed for wheezing.  1 Inhaler  11  . amLODipine  (NORVASC) 10 MG tablet Take 1 tablet (10 mg total) by mouth daily.  90 tablet  3  . ipratropium (ATROVENT HFA) 17 MCG/ACT inhaler Inhale 2 puffs into the lungs 4 (four) times daily.  3 Inhaler  3  . losartan (COZAAR) 50 MG tablet Take 1 tablet (50 mg total) by mouth daily. Take as needed for blood pressure >150  90 tablet  0  . Multiple Vitamin (MULTIVITAMIN) tablet Take 1 tablet by mouth daily.      . Multiple Vitamins-Minerals (OCUVITE EYE + MULTI) TABS Take 1 tablet by mouth daily.      . Peak Flow Meter DEVI Use as directed to monitor asthma  1 each  11  . beclomethasone (QVAR) 80 MCG/ACT inhaler Inhale 1 puff into the lungs 2 (two) times daily.  3 Inhaler  1   No current facility-administered medications for this visit.     Allergies (verified) Review of patient's allergies indicates no known allergies.   PAST HISTORY  Family History No family history on file.  Social History History  Substance Use Topics  . Smoking status: Never Smoker   . Smokeless tobacco: Not on file  . Alcohol Use: No     Are there smokers in your home (other than you)? No  Risk Factors Current exercise habits: Home exercise routine includes works in the yard, has stairs in house.  Dietary issues discussed: cut down  portion size, avoids fatty and salty foods, plenty of fruits and veggies. Banana every day.   Cardiac risk factors: advanced age (older than 48 for men, 35 for women), dyslipidemia, family history of premature cardiovascular disease, hypertension and sedentary lifestyle.  Depression Screen (Note: if answer to either of the following is "Yes", a more complete depression screening is indicated)   Over the past two weeks, have you felt down, depressed or hopeless? No  Over the past two weeks, have you felt little interest or pleasure in doing things? No  Have you lost interest or pleasure in daily life? No  Do you often feel hopeless? No  Do you cry easily over simple problems?  No  Activities of Daily Living In your present state of health, do you have any difficulty performing the following activities?:  Driving? No - pt rarely drives - her husband drives but she still has her driver's license and feels like she can drive Managing money?  No - does them with her husband Feeding yourself? No Getting from bed to chair? No Climbing a flight of stairs? No Preparing food and eating?: No Bathing or showering? No Getting dressed: No Getting to the toilet? No Using the toilet:No Moving around from place to place: No In the past year have you fallen or had a near fall?:No   Are you sexually active?  Yes  Do you have more than one partner?  No  Hearing Difficulties: No Do you often ask people to speak up or repeat themselves? No Do you experience ringing or noises in your ears? No Do you have difficulty understanding soft or whispered voices? No   Do you feel that you have a problem with memory? No  Do you often misplace items? No  Do you feel safe at home?  Yes  Cognitive Testing  Alert? Yes  Normal Appearance?Yes  Oriented to person? Yes  Place? Yes   Time? Yes  Recall of three objects? Yes  Can perform simple calculations? Yes  Displays appropriate judgment?Yes  Can read the correct time from a watch face?Yes   Advanced Directives have been discussed with the patient? Yes  List the Names of Other Physician/Practitioners you currently use: 1.  [place] [place] [address]   Indicate any recent Medical Services you may have received from other than [facility] providers in the past year (date may be approximate).  Immunization History  Administered Date(s) Administered  . Influenza Split 09/02/2012  . Influenza,inj,Quad PF,36+ Mos 06/23/2013    Screening Tests Health Maintenance  Topic Date Due  . Tetanus/tdap  06/14/1952  . Zostavax  06/14/1993  . Pneumococcal Polysaccharide Vaccine Age 25 And Over  06/14/1998  . Colonoscopy  04/06/2014  . Influenza  Vaccine  05/26/2014    All answers were reviewed with the patient and necessary referrals were made:  [facility], [place]   08/04/2013   History reviewed: allergies, current medications, past family history, past medical history, past social history, past surgical history and problem list  Review of Systems Pertinent items are noted in HPI.    Objective:     Vision by Snellen chart: right eye:20/20, left eye:20/20  Body mass index is 26.5 kg/(m^2). BP 160/80  Pulse 82  Temp(Src) 98.1 F (36.7 C) (Oral)  Resp 16  Ht 5' 3.5" (1.613 m)  Wt 152 lb (68.947 kg)  BMI 26.5 kg/m2  SpO2 98%  BP 160/80  Pulse 82  Temp(Src) 98.1 F (36.7 C) (Oral)  Resp 16  Ht  5' 3.5" (1.613 m)  Wt 152 lb (68.947 kg)  BMI 26.5 kg/m2  SpO2 98%  General Appearance:    Alert, cooperative, no distress, appears stated age  Head:    Normocephalic, without obvious abnormality, atraumatic  Eyes:    PERRL, conjunctiva/corneas clear, EOM's intact, fundi    benign, both eyes  Ears:    Normal TM's and external ear canals, both ears  Nose:   Nares normal, septum midline, mucosa normal, no drainage    or sinus tenderness  Throat:   Lips, mucosa, and tongue normal; teeth and gums normal  Neck:   Supple, symmetrical, trachea midline, no adenopathy;    thyroid:  no enlargement/tenderness/nodules; no carotid   bruit or JVD  Back:     Symmetric, no curvature, ROM normal, no CVA tenderness  Lungs:     Clear to auscultation bilaterally, respirations unlabored  Chest Wall:    No tenderness or deformity   Heart:    Regular rate and rhythm, S1 and S2 normal, no murmur, rub   or gallop     Abdomen:     Soft, non-tender, bowel sounds active all four quadrants,    no masses, no organomegaly        Extremities:   Extremities normal, atraumatic, no cyanosis or edema  Pulses:   2+ and symmetric all extremities  Skin:   Skin color, texture, turgor normal, no rashes or lesions  Lymph nodes:   Cervical, supraclavicular, and  axillary nodes normal  Neurologic:   CNII-XII intact, normal strength, sensation and reflexes    throughout       Assessment:     Annual Medicare preventative wellness exam      Plan:     During the course of the visit the patient was educated and counseled about appropriate screening and preventive services including:    Pneumococcal vaccine   Influenza vaccine  Screening mammography  Screening Pap smear and pelvic exam   Bone densitometry screening  Colorectal cancer screening - Do not know who pt saw prior.  Has 04/06/04 listed as last colonoscopy with no results or provider.  11/14/99 had colonoscopy with Dr. Sabino Gasser with polyp removed per Epic so will refer to GI as likely due for repeat colonoscopy.  Diabetes screening  Nutrition counseling   Advanced directives: has an advanced directive - a copy HAS NOT been provided.  Diet review for nutrition referral? Yes ____  Not Indicated _X___   Patient Instructions (the written plan) was given to the patient.  Medicare Attestation I have personally reviewed: The patient's medical and social history Their use of alcohol, tobacco or illicit drugs Their current medications and supplements The patient's functional ability including ADLs,fall risks, home safety risks, cognitive, and hearing and visual impairment Diet and physical activities Evidence for depression or mood disorders  The patient's weight, height, BMI, and visual acuity have been recorded in the chart.  I have made referrals, counseling, and provided education to the patient based on review of the above and I have provided the patient with a written personalized care plan for preventive services.     Norberto Sorenson, MD   08/04/2013

## 2013-09-27 ENCOUNTER — Encounter: Payer: Self-pay | Admitting: Family Medicine

## 2013-10-02 ENCOUNTER — Other Ambulatory Visit: Payer: Self-pay | Admitting: Family Medicine

## 2013-10-02 NOTE — Telephone Encounter (Signed)
Pt is calling for a refill on losartan Please call pt advise

## 2013-10-03 ENCOUNTER — Telehealth: Payer: Self-pay

## 2013-10-03 NOTE — Telephone Encounter (Signed)
Patient is calling to speak to dr Clelia Croft about her losartan please call patient back at [phone]

## 2013-10-04 MED ORDER — LOSARTAN POTASSIUM 50 MG PO TABS: ORAL_TABLET | ORAL | Status: DC

## 2013-10-04 NOTE — Telephone Encounter (Signed)
She needs Rx sent to mail order, with note to rush, this is done.

## 2013-11-16 ENCOUNTER — Ambulatory Visit (INDEPENDENT_AMBULATORY_CARE_PROVIDER_SITE_OTHER): Payer: Medicare Other | Admitting: Family Medicine

## 2013-11-16 ENCOUNTER — Encounter: Payer: Self-pay | Admitting: Family Medicine

## 2013-11-16 VITALS — BP 142/82 | HR 77 | Temp 98.0°F | Resp 17 | Ht 64.0 in | Wt 151.0 lb

## 2013-11-16 DIAGNOSIS — Z79899 Other long term (current) drug therapy: Secondary | ICD-10-CM

## 2013-11-16 DIAGNOSIS — E785 Hyperlipidemia, unspecified: Secondary | ICD-10-CM

## 2013-11-16 DIAGNOSIS — I10 Essential (primary) hypertension: Secondary | ICD-10-CM

## 2013-11-16 DIAGNOSIS — J45909 Unspecified asthma, uncomplicated: Secondary | ICD-10-CM

## 2013-11-16 LAB — BASIC METABOLIC PANEL
BUN: 11 mg/dL (ref 6–23)
CHLORIDE: 104 meq/L (ref 96–112)
CO2: 27 mEq/L (ref 19–32)
CREATININE: 0.78 mg/dL (ref 0.50–1.10)
Calcium: 9.9 mg/dL (ref 8.4–10.5)
Glucose, Bld: 106 mg/dL — ABNORMAL HIGH (ref 70–99)
Potassium: 4.4 mEq/L (ref 3.5–5.3)
Sodium: 140 mEq/L (ref 135–145)

## 2013-11-16 LAB — POCT CBC
Granulocyte percent: 49.3 %G (ref 37–80)
HCT, POC: 43.6 % (ref 37.7–47.9)
Hemoglobin: 13.7 g/dL (ref 12.2–16.2)
Lymph, poc: 1.9 (ref 0.6–3.4)
MCH: 26.5 pg — AB (ref 27–31.2)
MCHC: 31.4 g/dL — AB (ref 31.8–35.4)
MCV: 84.3 fL (ref 80–97)
MID (CBC): 0.4 (ref 0–0.9)
MPV: 9.8 fL (ref 0–99.8)
PLATELET COUNT, POC: 236 10*3/uL (ref 142–424)
POC Granulocyte: 2.2 (ref 2–6.9)
POC LYMPH PERCENT: 41.5 %L (ref 10–50)
POC MID %: 9.2 %M (ref 0–12)
RBC: 5.17 M/uL (ref 4.04–5.48)
RDW, POC: 14 %
WBC: 4.5 10*3/uL — AB (ref 4.6–10.2)

## 2013-11-16 MED ORDER — ALBUTEROL SULFATE HFA 108 (90 BASE) MCG/ACT IN AERS: 2.0000 | INHALATION_SPRAY | RESPIRATORY_TRACT | Status: DC | PRN

## 2013-11-16 MED ORDER — LOSARTAN POTASSIUM 50 MG PO TABS: ORAL_TABLET | ORAL | Status: DC

## 2013-11-16 MED ORDER — AMLODIPINE BESYLATE 10 MG PO TABS: 10.0000 mg | ORAL_TABLET | Freq: Every day | ORAL | Status: DC

## 2013-11-16 MED ORDER — BECLOMETHASONE DIPROPIONATE 80 MCG/ACT IN AERS: 1.0000 | INHALATION_SPRAY | Freq: Two times a day (BID) | RESPIRATORY_TRACT | Status: DC

## 2013-11-16 NOTE — Patient Instructions (Signed)
Lets try using the QVar INSTEAD of the atrovent/ipratropium.  Continue on the ProAir (albuterol).  If the QVar is not working as well for you and you want to switch back to the atrovent/ipratropium then just call our office and leave a message and I will be happy to do this for you. Lets plan to see you back again around late October or November for your next medication refill and your annual wellness physical exam.

## 2013-11-16 NOTE — Progress Notes (Signed)
Subjective:    Patient ID: Connie Mccann, female    DOB: 31-Mar-1931, 80 y.o.   MRN: [id number] Chief Complaint  Patient presents with  . Medication Problem    patient wants to change to a different medicine     HPI This chart was scribed for Connie Mccann-MD by Celesta Gentile, Scribe. This patient was seen in room 1 and the patient's care was started at 11:12 AM.  HPI Comments: Connie Mccann is a 80 y.o. female who presents to the [facility] with her husband for a medication change.  Pt states that she had a phone conference w/ a pharmacist from her insurance yesterday and they recommend that she be switched to Pro-Air as it would be the cheapest for her at $43.  She currently has 2 inhalers w/ her - atrovent and pro-air - and she has 3 inhalers rx'ed - atrovent, albuterol (proventil/ventonlin), and qvar).  Pt doesn't remember every trying the qvar but reports the other 2 work really well for her. Denies any prob w/ her breathing, denies ever having any SHoB, wheeze, or cough that isn't controlled by her inhalers.   Pt states that she did feel dizzy the other day, but it subsided very quickly - just a few secs. No other cardiac sxs.  Pt states that her BP at home has been normal, usually in the 130s.  Pt was last seen in October for her annual wellness visit.  She has a h/o of labile BP, which usually runs about 657 systolic outside the office.  Pt is taking Norvasc 10 mg daily.  Pt is taking losartan 50 mg as needed for HTN when systolic is > 846.   Pt's cholesterol was mildly elevated in August, so she was told to exercise moderately and watch her diet.     History reviewed. No pertinent past surgical history.  History reviewed. No pertinent family history.  History   Social History  . Marital Status: Married    Spouse Name: N/A    Number of Children: N/A  . Years of Education: N/A   Occupational History  . Not on file.   Social History Main Topics  . Smoking  status: Never Smoker   . Smokeless tobacco: Not on file  . Alcohol Use: No  . Drug Use: No  . Sexual Activity: Not on file   Other Topics Concern  . Not on file   Social History Narrative  . No narrative on file    No Known Allergies  Patient Active Problem List   Diagnosis Date Noted  . Other and unspecified hyperlipidemia 07/07/2013  . HTN (hypertension) 05/21/2012  . GERD (gastroesophageal reflux disease) ?? 05/21/2012   Review of Systems  Constitutional: Negative for fever and chills.  HENT: Negative for congestion, ear pain, rhinorrhea and sore throat.   Respiratory: Negative for cough, shortness of breath and wheezing.   Cardiovascular: Negative for chest pain.  Gastrointestinal: Negative for nausea, vomiting, abdominal pain and diarrhea.  Musculoskeletal: Negative for back pain.  Skin: Negative for color change and rash.  Neurological: Negative for syncope.   Triage Vitals: BP 142/82  Pulse 77  Temp(Src) 98 F (36.7 C) (Oral)  Resp 17  Ht 5\' 4"  (1.626 m)  Wt 151 lb (68.493 kg)  BMI 25.91 kg/m2  SpO2 98% Objective:   Physical Exam  Nursing note and vitals reviewed. Constitutional: She is oriented to person, place, and time. She appears well-developed and well-nourished. No  distress.  HENT:  Head: Normocephalic and atraumatic.  Eyes: Conjunctivae and EOM are normal. Right eye exhibits no discharge. Left eye exhibits no discharge.  Neck: Neck supple. No tracheal deviation present. No thyromegaly present.  Cardiovascular: Normal rate, regular rhythm and normal heart sounds.   No murmur heard. Pulmonary/Chest: Effort normal. No respiratory distress. She has no decreased breath sounds. She has wheezes. She has no rhonchi. She has no rales.  Diffuse expiratory wheezing  Musculoskeletal: Normal range of motion.  Lymphadenopathy:       Head (right side): No submandibular, no tonsillar, no preauricular, no posterior auricular and no occipital adenopathy present.        Head (left side): No submandibular, no tonsillar, no preauricular, no posterior auricular and no occipital adenopathy present.    She has no cervical adenopathy.       Right: No supraclavicular adenopathy present.       Left: No supraclavicular adenopathy present.  Neurological: She is alert and oriented to person, place, and time.  Skin: Skin is warm and dry.  Psychiatric: She has a normal mood and affect. Her behavior is normal.       Assessment & Plan:  Other and unspecified hyperlipidemia - diet controlled, reviewed diet changes - pt will try to work on low fat diet.  HTN (hypertension) - much improved control on current regiment of norvasc and losartan.  Pt has appt to f/u w/ me in 3 mos but if she would like ok to defer visit till Oct w/ AWV as she is doing really well.  Asthma, chronic - Plan: POCT CBC - switch ordered albuterol from "proventil/ventolin" to pro-air. Rec she try qvar instead of atrovent as will likely be more effective for her but if she doesn't like or expense is to much I am happy to refill her Atrovent.  Encounter for long-term (current) use of other medications - Plan: Basic metabolic panel  Meds ordered this encounter  Medications  . albuterol (PROAIR HFA) 108 (90 BASE) MCG/ACT inhaler    Sig: Inhale 2 puffs into the lungs every 4 (four) hours as needed for wheezing or shortness of breath.    Dispense:  1 Inhaler    Refill:  11  . beclomethasone (QVAR) 80 MCG/ACT inhaler    Sig: Inhale 1 puff into the lungs 2 (two) times daily.    Dispense:  1 Inhaler    Refill:  5  . losartan (COZAAR) 50 MG tablet    Sig: Take 1 tablet (50 mg total) by mouth daily. Take as needed for blood pressure  >150.    Dispense:  90 tablet    Refill:  0  . amLODipine (NORVASC) 10 MG tablet    Sig: Take 1 tablet (10 mg total) by mouth daily.    Dispense:  90 tablet    Refill:  3    I personally performed the services described in this documentation, which was scribed in my  presence. The recorded information has been reviewed and considered, and addended by me as needed.  Delman Cheadle, MD MPH

## 2013-11-19 ENCOUNTER — Encounter: Payer: Self-pay | Admitting: Family Medicine

## 2013-11-24 ENCOUNTER — Telehealth: Payer: Self-pay

## 2013-11-24 NOTE — Telephone Encounter (Signed)
Patient called with several concerns about her Atrovent and Proair medicaitons. Phone call was difficult to follow due to patient's husband over talking patient in the background. Got as much info as possible. [facility]  [phone]

## 2013-11-26 NOTE — Telephone Encounter (Signed)
Called pt, she needs a refill on ProAir but it was already ordered and she is waiting on [facility] to send it.

## 2014-01-16 ENCOUNTER — Ambulatory Visit (INDEPENDENT_AMBULATORY_CARE_PROVIDER_SITE_OTHER): Payer: Medicare Other | Admitting: Internal Medicine

## 2014-01-16 VITALS — BP 142/76 | HR 67 | Temp 98.1°F | Resp 16 | Ht 64.5 in | Wt 152.0 lb

## 2014-01-16 DIAGNOSIS — L0291 Cutaneous abscess, unspecified: Secondary | ICD-10-CM

## 2014-01-16 DIAGNOSIS — L0390 Cellulitis, unspecified: Secondary | ICD-10-CM

## 2014-01-16 DIAGNOSIS — L723 Sebaceous cyst: Secondary | ICD-10-CM

## 2014-01-16 MED ORDER — DOXYCYCLINE HYCLATE 100 MG PO TABS: 100.0000 mg | ORAL_TABLET | Freq: Two times a day (BID) | ORAL | Status: DC

## 2014-01-16 MED ORDER — MUPIROCIN 2 % EX OINT: 1.0000 "application " | TOPICAL_OINTMENT | Freq: Three times a day (TID) | CUTANEOUS | Status: DC

## 2014-01-16 NOTE — Patient Instructions (Signed)
Cellulitis Cellulitis is an infection of the skin and the tissue beneath it. The infected area is usually red and tender. Cellulitis occurs most often in the arms and lower legs.  CAUSES  Cellulitis is caused by bacteria that enter the skin through cracks or cuts in the skin. The most common types of bacteria that cause cellulitis are Staphylococcus and Streptococcus. SYMPTOMS   Redness and warmth.  Swelling.  Tenderness or pain.  Fever. DIAGNOSIS  Your caregiver can usually determine what is wrong based on a physical exam. Blood tests may also be done. TREATMENT  Treatment usually involves taking an antibiotic medicine. HOME CARE INSTRUCTIONS   Take your antibiotics as directed. Finish them even if you start to feel better.  Keep the infected arm or leg elevated to reduce swelling.  Apply a warm cloth to the affected area up to 4 times per day to relieve pain.  Only take over-the-counter or prescription medicines for pain, discomfort, or fever as directed by your caregiver.  Keep all follow-up appointments as directed by your caregiver. SEEK MEDICAL CARE IF:   You notice red streaks coming from the infected area.  Your red area gets larger or turns dark in color.  Your bone or joint underneath the infected area becomes painful after the skin has healed.  Your infection returns in the same area or another area.  You notice a swollen bump in the infected area.  You develop new symptoms. SEEK IMMEDIATE MEDICAL CARE IF:   You have a fever.  You feel very sleepy.  You develop vomiting or diarrhea.  You have a general ill feeling (malaise) with muscle aches and pains. MAKE SURE YOU:   Understand these instructions.  Will watch your condition.  Will get help right away if you are not doing well or get worse. Document Released: 07/22/2005 Document Revised: 04/12/2012 Document Reviewed: 12/28/2011 [facility] Patient Information 2014 [place]. Epidermal Cyst An  epidermal cyst is sometimes called a sebaceous cyst, epidermal inclusion cyst, or infundibular cyst. These cysts usually contain a substance that looks "pasty" or "cheesy" and may have a bad smell. This substance is a protein called keratin. Epidermal cysts are usually found on the face, neck, or trunk. They may also occur in the vaginal area or other parts of the genitalia of both men and women. Epidermal cysts are usually small, painless, slow-growing bumps or lumps that move freely under the skin. It is important not to try to pop them. This may cause an infection and lead to tenderness and swelling. CAUSES  Epidermal cysts may be caused by a deep penetrating injury to the skin or a plugged hair follicle, often associated with acne. SYMPTOMS  Epidermal cysts can become inflamed and cause:  Redness.  Tenderness.  Increased temperature of the skin over the bumps or lumps.  Grayish-white, bad smelling material that drains from the bump or lump. DIAGNOSIS  Epidermal cysts are easily diagnosed by your caregiver during an exam. Rarely, a tissue sample (biopsy) may be taken to rule out other conditions that may resemble epidermal cysts. TREATMENT   Epidermal cysts often get better and disappear on their own. They are rarely ever cancerous.  If a cyst becomes infected, it may become inflamed and tender. This may require opening and draining the cyst. Treatment with antibiotics may be necessary. When the infection is gone, the cyst may be removed with minor surgery.  Small, inflamed cysts can often be treated with antibiotics or by injecting steroid medicines.  Sometimes, epidermal cysts become large and bothersome. If this happens, surgical removal in your caregiver's office may be necessary. HOME CARE INSTRUCTIONS  Only take over-the-counter or prescription medicines as directed by your caregiver.  Take your antibiotics as directed. Finish them even if you start to feel better. SEEK MEDICAL  CARE IF:   Your cyst becomes tender, red, or swollen.  Your condition is not improving or is getting worse.  You have any other questions or concerns. MAKE SURE YOU:  Understand these instructions.  Will watch your condition.  Will get help right away if you are not doing well or get worse. Document Released: 09/12/2004 Document Revised: 01/04/2012 Document Reviewed: 04/20/2011 [facility] Patient Information 2014 [place], [place].

## 2014-01-16 NOTE — Progress Notes (Signed)
   Patient ID: Connie Mccann MRN: [id number], DOB: Sep 30, 1931, 80 y.o. Date of Encounter: 01/16/2014, 5:10 PM    PROCEDURE NOTE: Verbal consent obtained. Risks and benefits of the procedure were explained to the patient. Patient made an informed decision to proceed with the procedure. Betadine prep per usual protocol. Local anesthesia obtained with 2% lidocaine with epi 1 cc.   1 cm incision made with 11 blade along lesion.  Culture taken by Dr. Elder Cyphers previously. No purulence expressed. Moderate sebaceous material  Lesion explored revealing no loculations. Irrigated with lidocaine Packed with small amount of 1/4 inch plain lidocaine.  Dressed. Wound care instructions including precautions with patient. Patient tolerated the procedure well. Recheck in 48 hours.      Signed, Christell Faith, MHS, PA-C [facility] [place], [place] [id number] [facility] 01/16/2014 5:10 PM

## 2014-01-16 NOTE — Progress Notes (Signed)
   Subjective:    Patient ID: Connie Mccann, female    DOB: [birth year], 80 y.o.   MRN: [id number]  HPI 80 yo healthy lady has painful swollen cyst on back of her neck. Has had a small sebaceous cyst there all her life. No fever or body aches.   Review of Systems    htn, gerd, asthma Objective:   Physical Exam  Vitals reviewed. Constitutional: She is oriented to person, place, and time. She appears well-developed and well-nourished. No distress.  HENT:  Head: Normocephalic.  Eyes: EOM are normal.  Neck: Trachea normal and normal range of motion. Neck supple. No mass present.    Infected sebaceous cyst, draining purulent material  Culture done  Pulmonary/Chest: Effort normal.  Musculoskeletal: Normal range of motion.  Lymphadenopathy:    She has no cervical adenopathy.  Neurological: She is alert and oriented to person, place, and time. She exhibits normal muscle tone. Coordination normal.  Skin: Rash noted. There is erythema.  Psychiatric: She has a normal mood and affect.    ID by Christell Faith PAc      Assessment & Plan:  Wound care daily Doxycycline 100mg  bid/Mupirocin ointment

## 2014-01-18 ENCOUNTER — Ambulatory Visit (INDEPENDENT_AMBULATORY_CARE_PROVIDER_SITE_OTHER): Payer: Medicare Other | Admitting: Physician Assistant

## 2014-01-18 VITALS — BP 162/74 | HR 74 | Temp 97.9°F | Resp 18 | Ht 64.0 in | Wt 151.0 lb

## 2014-01-18 DIAGNOSIS — L0390 Cellulitis, unspecified: Secondary | ICD-10-CM

## 2014-01-18 DIAGNOSIS — L0291 Cutaneous abscess, unspecified: Secondary | ICD-10-CM

## 2014-01-18 NOTE — Progress Notes (Signed)
   Subjective:    Patient ID: [name], female    DOB: 10/30/1930, 80 y.o.   MRN: [id number]  Wound Check     Ms. Fait is a very pleasant 80 yr old female here for wound care following I&D of an infected sebaceous cyst 48 hours ago.  She reports she is doing well.  Taking the doxy as directed.  Has not use the mupirocin because she did not want to take the dressing off.  She reports less pain.  No FC, NV    Review of Systems  Constitutional: Negative for fever and chills.  Respiratory: Negative.   Gastrointestinal: Negative.   Skin: Positive for wound.       Objective:   Physical Exam  Vitals reviewed. Constitutional: She is oriented to person, place, and time. She appears well-developed and well-nourished. No distress.  HENT:  Head: Normocephalic and atraumatic.  Eyes: Conjunctivae are normal. No scleral icterus.  Pulmonary/Chest: Effort normal.  Neurological: She is alert and oriented to person, place, and time.  Skin: Skin is warm and dry.     Healing wound at posterior neck; mild surrounding induration but no erythema or warmth; no drainage; wound bed healthy  Psychiatric: She has a normal mood and affect. Her behavior is normal.       Assessment & Plan:  Abscess   Ms. Abt is a very pleasant 81 yr old female here for wound care following I&D of an infected sebaceous cyst.  The area appears to be healing well.  There is no drainage from the wound today.  Wound bed healthy and very shallow.  I have not repacked today.  Can apply small amount mupirocin TID.  Keep wound clean and dry until completely healed.  RTC if concerns.    Alonza Smoker MHS, PA-C [facility] 3/26/20154:22 PM

## 2014-01-19 LAB — WOUND CULTURE
Gram Stain: NONE SEEN
Gram Stain: NONE SEEN
Gram Stain: NONE SEEN
Organism ID, Bacteria: NO GROWTH

## 2014-01-23 ENCOUNTER — Telehealth: Payer: Self-pay

## 2014-01-23 NOTE — Telephone Encounter (Signed)
Advised pt she did not RTC as long as area is healing. She has been keeping it clean and dry as directed.

## 2014-01-23 NOTE — Telephone Encounter (Signed)
PT STATES SHE WAS SEEN FOR AN ABSCESS THE OTHER DAY AND SOMEONE CALLED TODAY TO SEE IF SHE WAS COMING BACK IN, BUT SHE FEELS FINE AND EVERYTHING IS OK. DO NOT SEE THE NEED TO COME IN BUT YOU MAY REACH PT AT [phone] IF NEEDED

## 2014-02-23 ENCOUNTER — Ambulatory Visit: Payer: Medicare Other | Admitting: Family Medicine

## 2014-03-01 IMAGING — CR DG CHEST 2V
2 series · 2 of 2 positions shown · non-contrast
Comparison: None

CLINICAL DATA: Cough, right shoulder pain

CHEST - 2 VIEW

[PA]
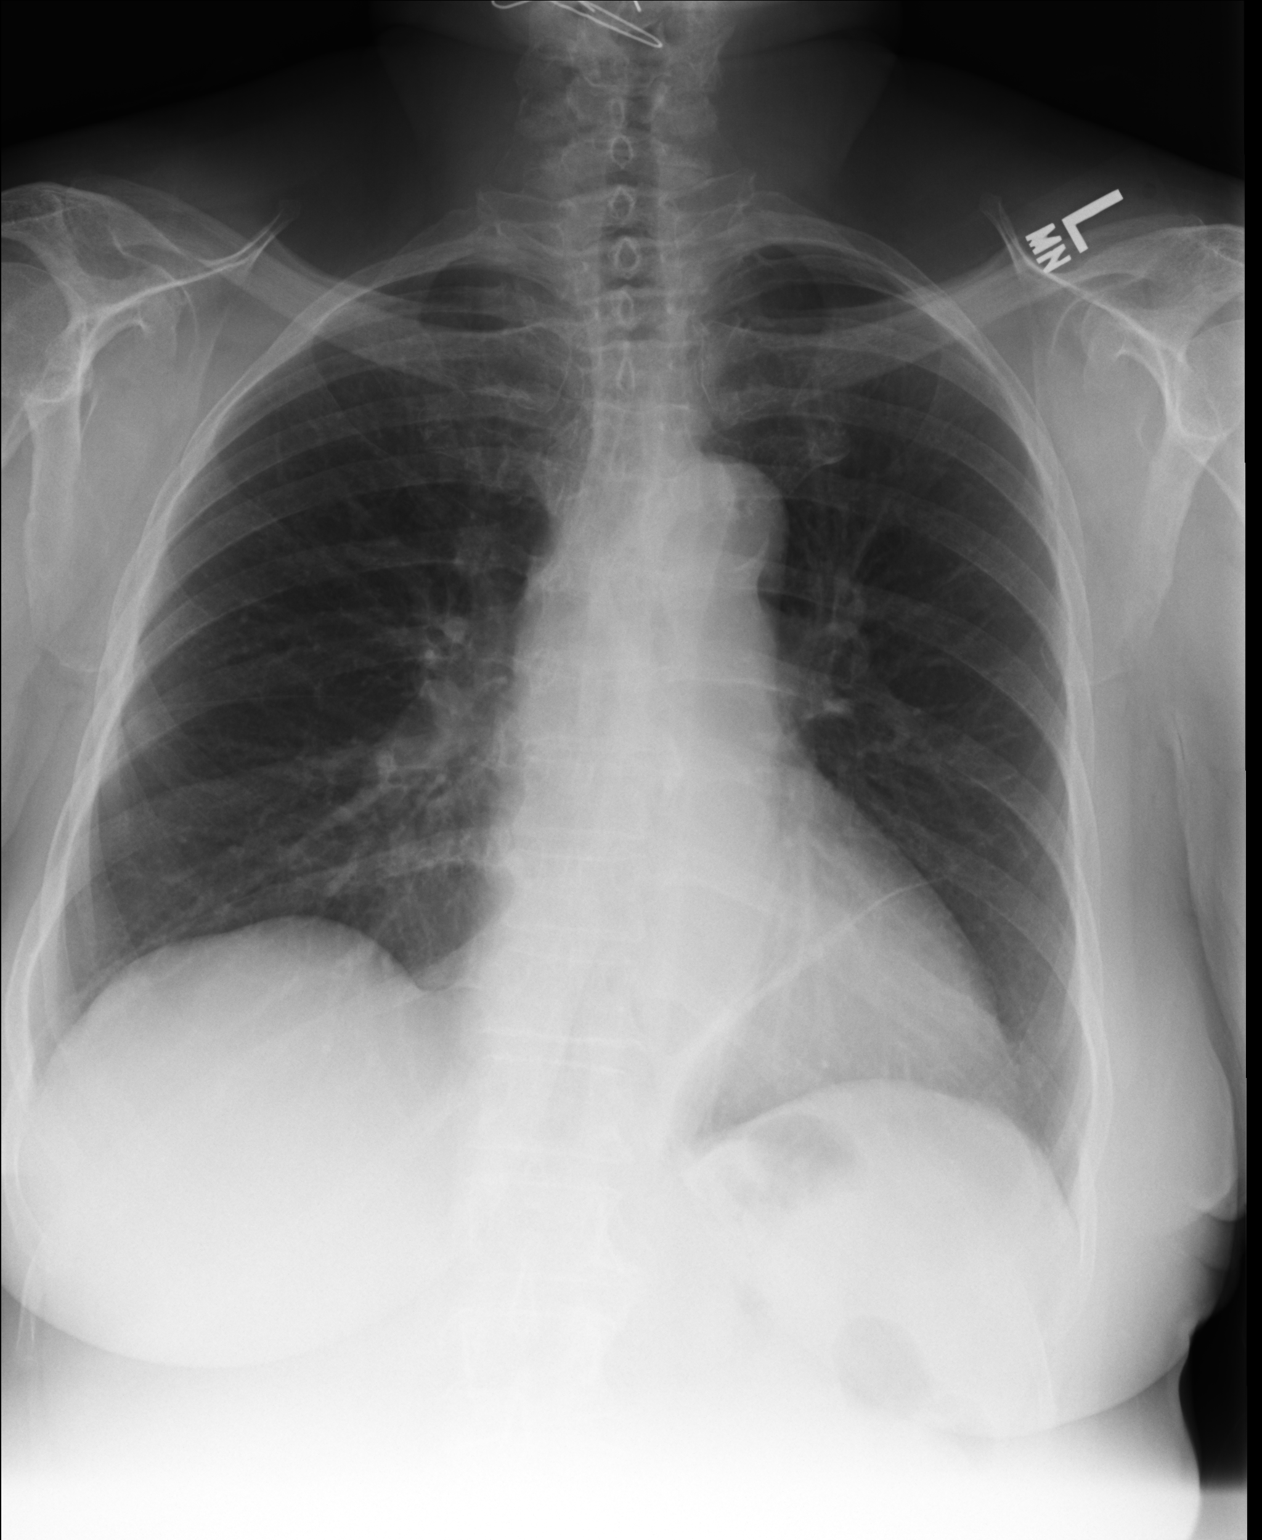

[lateral]
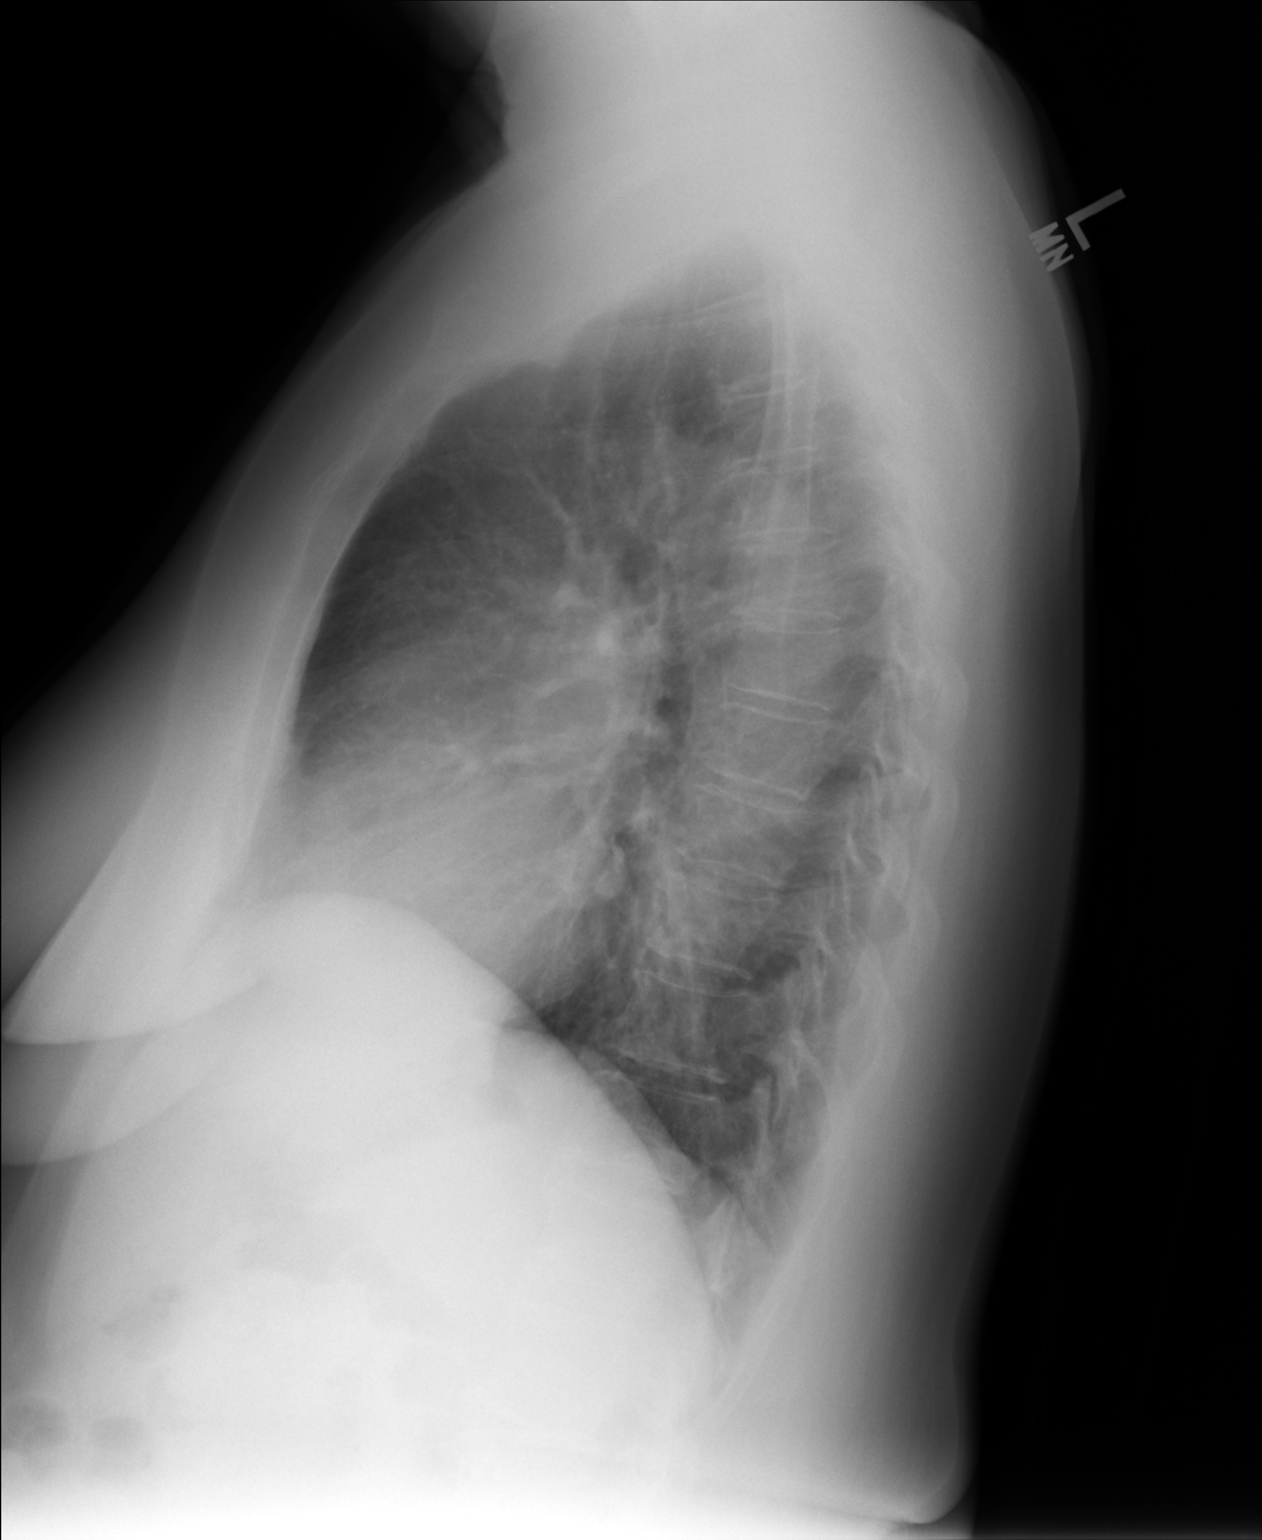

[2 of 2 positions shown; findings below may reference images not displayed]

FINDINGS: Cardiomediastinal silhouette is unremarkable.  Mild
elevation of the right hemidiaphragm.  Linear atelectasis or
scarring left lower lobe.  Mild perihilar increased bronchial
markings suspicious for bronchitic changes.  No segmental
infiltrate or pulmonary edema.
IMPRESSION: No segmental infiltrate or pulmonary edema.  Mild elevation of the
right hemidiaphragm.  Linear atelectasis or scarring left lower
lobe.  Mild bronchitic changes are suspected.

## 2014-05-31 ENCOUNTER — Telehealth: Payer: Self-pay

## 2014-05-31 ENCOUNTER — Ambulatory Visit (INDEPENDENT_AMBULATORY_CARE_PROVIDER_SITE_OTHER): Payer: Medicare Other | Admitting: Emergency Medicine

## 2014-05-31 ENCOUNTER — Ambulatory Visit (HOSPITAL_COMMUNITY)
Admission: RE | Admit: 2014-05-31 | Discharge: 2014-05-31 | Disposition: A | Discharge status: Home / Self Care | Payer: Medicare Other | Source: Ambulatory Visit | Attending: Emergency Medicine | Admitting: Emergency Medicine

## 2014-05-31 ENCOUNTER — Encounter (HOSPITAL_COMMUNITY): Payer: Self-pay

## 2014-05-31 ENCOUNTER — Telehealth: Payer: Self-pay | Admitting: Emergency Medicine

## 2014-05-31 VITALS — BP 116/62 | HR 107 | Temp 97.9°F | Resp 18 | Ht 64.0 in | Wt 153.0 lb

## 2014-05-31 DIAGNOSIS — J90 Pleural effusion, not elsewhere classified: Secondary | ICD-10-CM | POA: Insufficient documentation

## 2014-05-31 DIAGNOSIS — K7689 Other specified diseases of liver: Secondary | ICD-10-CM | POA: Diagnosis not present

## 2014-05-31 DIAGNOSIS — R109 Unspecified abdominal pain: Secondary | ICD-10-CM

## 2014-05-31 DIAGNOSIS — R599 Enlarged lymph nodes, unspecified: Secondary | ICD-10-CM | POA: Diagnosis not present

## 2014-05-31 DIAGNOSIS — R609 Edema, unspecified: Secondary | ICD-10-CM | POA: Diagnosis not present

## 2014-05-31 DIAGNOSIS — K5730 Diverticulosis of large intestine without perforation or abscess without bleeding: Secondary | ICD-10-CM | POA: Diagnosis not present

## 2014-05-31 DIAGNOSIS — R1013 Epigastric pain: Secondary | ICD-10-CM

## 2014-05-31 DIAGNOSIS — N281 Cyst of kidney, acquired: Secondary | ICD-10-CM | POA: Insufficient documentation

## 2014-05-31 DIAGNOSIS — K3189 Other diseases of stomach and duodenum: Secondary | ICD-10-CM

## 2014-05-31 LAB — POCT CBC
GRANULOCYTE PERCENT: 79.4 % (ref 37–80)
HCT, POC: 41.1 % (ref 37.7–47.9)
Hemoglobin: 13.2 g/dL (ref 12.2–16.2)
Lymph, poc: 1.3 (ref 0.6–3.4)
MCH, POC: 25.2 pg — AB (ref 27–31.2)
MCHC: 32.1 g/dL (ref 31.8–35.4)
MCV: 78.4 fL — AB (ref 80–97)
MID (cbc): 0.4 (ref 0–0.9)
MPV: 7.8 fL (ref 0–99.8)
PLATELET COUNT, POC: 193 10*3/uL (ref 142–424)
POC GRANULOCYTE: 6.8 (ref 2–6.9)
POC LYMPH %: 15.4 % (ref 10–50)
POC MID %: 5.2 %M (ref 0–12)
RBC: 5.24 M/uL (ref 4.04–5.48)
RDW, POC: 14.4 %
WBC: 8.6 10*3/uL (ref 4.6–10.2)

## 2014-05-31 LAB — BUN
BUN: 12 mg/dL (ref 6–23)
BUN: 13 mg/dL (ref 6–23)

## 2014-05-31 LAB — CREATININE, SERUM
CREATININE: 1.03 mg/dL (ref 0.50–1.10)
Creat: 0.96 mg/dL (ref 0.50–1.10)
GFR calc Af Amer: 58 mL/min — ABNORMAL LOW (ref >90)
GFR, EST NON AFRICAN AMERICAN: 50 mL/min — AB (ref >90)

## 2014-05-31 LAB — BUN+CREAT: BUN/Creatinine Ratio: 12.5 Ratio

## 2014-05-31 MED ORDER — IOHEXOL 300 MG/ML  SOLN
50.0000 mL | Freq: Once | INTRAMUSCULAR | Status: AC | PRN
  Administered 2014-05-31: 50 mL via ORAL

## 2014-05-31 MED ORDER — IOHEXOL 300 MG/ML  SOLN
100.0000 mL | Freq: Once | INTRAMUSCULAR | Status: AC | PRN
  Administered 2014-05-31: 100 mL via INTRAVENOUS

## 2014-05-31 NOTE — Addendum Note (Signed)
Addended by: Constance Goltz on: 05/31/2014 01:51 PM   Modules accepted: Orders

## 2014-05-31 NOTE — Telephone Encounter (Signed)
Left voice message.

## 2014-05-31 NOTE — Progress Notes (Signed)
[facility] [address] [place] [address] [phone]  Date:  05/31/2014   Name:  Connie Mccann   DOB:  02-12-1931   MRN:  [id number]  PCP:  Delman Cheadle, MD    Chief Complaint: Hernia   History of Present Illness:  Connie Mccann is a 80 y.o. very pleasant female patient who presents with the following:  Says she has a left inguinal mass and tenderness that has been present for about a week. No nausea, vomiting or fever or chills.  No stool change.  Normal appetite. Worse with lifting.  Decreases when rests.   No fever or chills.   No improvement with over the counter medications or other home remedies. Denies other complaint or health concern today.   Patient Active Problem List   Diagnosis Date Noted  . Other and unspecified hyperlipidemia 07/07/2013  . HTN (hypertension) 05/21/2012  . GERD (gastroesophageal reflux disease) ?? 05/21/2012    Past Medical History  Diagnosis Date  . Hypertension   . Asthma     History reviewed. No pertinent past surgical history.  History  Substance Use Topics  . Smoking status: Never Smoker   . Smokeless tobacco: Not on file  . Alcohol Use: No    History reviewed. No pertinent family history.  No Known Allergies  Medication list has been reviewed and updated.  Current Outpatient Prescriptions on File Prior to Visit  Medication Sig Dispense Refill  . albuterol (PROAIR HFA) 108 (90 BASE) MCG/ACT inhaler Inhale 2 puffs into the lungs every 4 (four) hours as needed for wheezing or shortness of breath.  1 Inhaler  11  . amLODipine (NORVASC) 10 MG tablet Take 1 tablet (10 mg total) by mouth daily.  90 tablet  3  . beclomethasone (QVAR) 80 MCG/ACT inhaler Inhale 1 puff into the lungs 2 (two) times daily.  1 Inhaler  5  . losartan (COZAAR) 50 MG tablet Take 1 tablet (50 mg total) by mouth daily. Take as needed for blood pressure  >150.  90 tablet  0  . Multiple Vitamin (MULTIVITAMIN) tablet Take 1 tablet by mouth  daily.      . Multiple Vitamins-Minerals (OCUVITE EYE + MULTI) TABS Take 1 tablet by mouth daily.      . Peak Flow Meter DEVI Use as directed to monitor asthma  1 each  11  . doxycycline (VIBRA-TABS) 100 MG tablet Take 1 tablet (100 mg total) by mouth 2 (two) times daily.  20 tablet  0  . ipratropium (ATROVENT HFA) 17 MCG/ACT inhaler Inhale 2 puffs into the lungs 4 (four) times daily.  3 Inhaler  3  . mupirocin ointment (BACTROBAN) 2 % Apply 1 application topically 3 (three) times daily.  30 g  0   No current facility-administered medications on file prior to visit.    Review of Systems:  As per HPI, otherwise negative.    Physical Examination: Filed Vitals:   05/31/14 0841  BP: 116/62  Pulse: 107  Temp: 97.9 F (36.6 C)  Resp: 18   Filed Vitals:   05/31/14 0841  Height: 5\' 4"  (1.626 m)  Weight: 153 lb (69.4 kg)   Body mass index is 26.25 kg/(m^2). Ideal Body Weight: Weight in (lb) to have BMI = 25: 145.3  GEN: WDWN, NAD, Non-toxic, A & O x 3 HEENT: Atraumatic, Normocephalic. Neck supple. No masses, No LAD. Ears and Nose: No external deformity. CV: RRR, No M/G/R. No JVD. No thrill. No  extra heart sounds. PULM: CTA B, no wheezes, crackles, rhonchi. No retractions. No resp. distress. No accessory muscle use. ABD: S, NT, ND, +BS. No rebound. No HSM.  Bilateral firm fullness along inguinal crease.  Right not tender.  Left is tender EXTR: No c/c/e NEURO Normal gait.  PSYCH: Normally interactive. Conversant. Not depressed or anxious appearing.  Calm demeanor.    Assessment and Plan: Inguinal crease mass doubt hernia CBC,  CT  Signed,  Ellison Carwin, MD

## 2014-05-31 NOTE — Telephone Encounter (Signed)
PATIENT'S DAUGHTER JUST CALLED AND WANTED TO LEAVE AN ALTERNATIVE PHONE NUMBER IN CASE WE DON'T REACH HER ON THE OTHER NUMBER SHE [place]. [phone]

## 2014-05-31 NOTE — Addendum Note (Signed)
Addended by: Constance Goltz on: 05/31/2014 01:38 PM   Modules accepted: Orders

## 2014-05-31 NOTE — Telephone Encounter (Signed)
DAUGHTER IS CALLING ON BEHALF OF PATIENT. PATIENT NEEDS STRONGER PAIN MEDICATION SENT TO [facility]. PLEASE Julien Girt [phone]

## 2014-06-01 ENCOUNTER — Other Ambulatory Visit: Payer: Self-pay | Admitting: Family Medicine

## 2014-06-01 ENCOUNTER — Other Ambulatory Visit: Payer: Self-pay | Admitting: Emergency Medicine

## 2014-06-01 DIAGNOSIS — R160 Hepatomegaly, not elsewhere classified: Secondary | ICD-10-CM

## 2014-06-01 DIAGNOSIS — R590 Localized enlarged lymph nodes: Secondary | ICD-10-CM

## 2014-06-01 MED ORDER — ACETAMINOPHEN-CODEINE #3 300-30 MG PO TABS: 1.0000 | ORAL_TABLET | ORAL | Status: DC | PRN

## 2014-06-01 MED ORDER — ACETAMINOPHEN-CODEINE #3 300-30 MG PO TABS
1.0000 | ORAL_TABLET | ORAL | Status: DC | PRN
Start: 2014-06-01 — End: 2014-06-01

## 2014-06-01 NOTE — Telephone Encounter (Signed)
Alternate # for contacting patient - looks like you tried to call her with CT results

## 2014-06-04 ENCOUNTER — Telehealth: Payer: Self-pay | Admitting: Oncology

## 2014-06-04 NOTE — Telephone Encounter (Signed)
S/W PATIENT AND GAVE NP APPT FOR PATIENT FOR 08/13 @ 1:30 W/DR. SHADAD.  REFERRING DR. Harmon Pier SHAW DX- CT CONCERNING METS LYMPHOMA

## 2014-06-04 NOTE — Telephone Encounter (Signed)
C/D 06/04/14 for appt. 06/07/14

## 2014-06-07 ENCOUNTER — Encounter: Payer: Self-pay | Admitting: Oncology

## 2014-06-07 ENCOUNTER — Ambulatory Visit: Payer: Medicare Other

## 2014-06-07 ENCOUNTER — Ambulatory Visit (HOSPITAL_BASED_OUTPATIENT_CLINIC_OR_DEPARTMENT_OTHER): Payer: Medicare Other | Admitting: Oncology

## 2014-06-07 ENCOUNTER — Other Ambulatory Visit: Payer: Self-pay

## 2014-06-07 ENCOUNTER — Telehealth: Payer: Self-pay | Admitting: Oncology

## 2014-06-07 VITALS — BP 130/59 | HR 79 | Temp 98.6°F | Resp 18 | Ht 64.0 in | Wt 150.8 lb

## 2014-06-07 DIAGNOSIS — K769 Liver disease, unspecified: Secondary | ICD-10-CM

## 2014-06-07 DIAGNOSIS — D47Z9 Other specified neoplasms of uncertain behavior of lymphoid, hematopoietic and related tissue: Secondary | ICD-10-CM

## 2014-06-07 DIAGNOSIS — R599 Enlarged lymph nodes, unspecified: Secondary | ICD-10-CM

## 2014-06-07 NOTE — Progress Notes (Signed)
Checked in new pt with no financial concerns. °

## 2014-06-07 NOTE — Consult Note (Signed)
Reason for Referral: Lymphadenopathy.   HPI: This is an 80 year old African American woman currently of [place] where she lived the majority of her life. She has a history of hypertension  As well as a reactive airway disease but for the most port in reasonable health.she presented with diffuse abdominal discomfort and  Left lower quadrant pain and suspicious for a hernia. She underwent a CT scan on 05/31/2014. The CT scan showed extensive adenopathy with enlarged lymph node noted in the abdominal and pelvic area. There is an 11 mm left pericellular node and a 19 mm gastrohepatic lymph node. There is mesenteric lymph node measuring 2.6 x 2.3 cm. There is also bulky retroperitoneal adenopathy. A left external iliac chain node measuring 2 cm in short axis as well as and left inguinal adenopathy the largest measuring 3 cm x 1.9 cm. A large 6.3 cm mass along the posterior segment of the right lobe of the liver that is suspicious for a complicated cyst but metastatic disease could not be ruled out. Based on these findings patient referred to me for an evaluation.   Clinically, she has really no symptoms at this point. She is a poor historian and maybe has an element of dementia and her history have been invited mostly by her husband and her daughter. She reports no fevers or chills or sweats. She reports her appetite have been within normal range without any weight loss or appetite changes. She does not report any headaches or blurry vision or syncope. What appears to have some memory issues. She does not report any chest pain orthopnea or PND. She does report some occasional wheezing. She does not report any nausea, vomiting, early satiety she does report left inguinal discomfort. She does not report any frequency urgency or hesitancy. Is not reporting any skeletal complaints. Rest of her review of systems unremarkable.   Past Medical History  Diagnosis Date  . Hypertension   . Asthma   :   Current  Outpatient Prescriptions  Medication Sig Dispense Refill  . amLODipine (NORVASC) 10 MG tablet Take 1 tablet (10 mg total) by mouth daily.  90 tablet  3  . beclomethasone (QVAR) 80 MCG/ACT inhaler Inhale 1 puff into the lungs 2 (two) times daily.  1 Inhaler  5  . losartan (COZAAR) 50 MG tablet Take 1 tablet (50 mg total) by mouth daily. Take as needed for blood pressure  >150.  90 tablet  0  . Multiple Vitamin (MULTIVITAMIN) tablet Take 1 tablet by mouth daily.      . Multiple Vitamins-Minerals (OCUVITE EYE + MULTI) TABS Take 1 tablet by mouth daily.      Marland Kitchen albuterol (PROAIR HFA) 108 (90 BASE) MCG/ACT inhaler Inhale 2 puffs into the lungs every 4 (four) hours as needed for wheezing or shortness of breath.  1 Inhaler  11   No current facility-administered medications for this visit.       No Known Allergies:  No family history is contributing area on review.   History   Social History  . Marital Status: Married    Spouse Name: N/A    Number of Children: N/A  . Years of Education: N/A   Occupational History  . Not on file.   Social History Main Topics  . Smoking status: Never Smoker   . Smokeless tobacco: Not on file  . Alcohol Use: No  . Drug Use: No  . Sexual Activity: Not on file   Other Topics Concern  . Not on  file   Social History Narrative  . No narrative on file  :  Pertinent items are noted in HPI.  Exam: ECOG 1 Blood pressure 130/59, pulse 79, temperature 98.6 F (37 C), temperature source Oral, resp. rate 18, height 5\' 4"  (1.626 m), weight 150 lb 12.8 oz (68.402 kg). General appearance: alert, cooperative and no distress Head: Normocephalic, without obvious abnormality Throat: lips, mucosa, and tongue normal; teeth and gums normal Neck: no adenopathy Back: symmetric, no curvature. ROM normal. No CVA tenderness. Resp: clear to auscultation bilaterally Chest wall: no tenderness Cardio: regular rate and rhythm, S1, S2 normal, no murmur, click, rub or  gallop GI: soft, non-tender; bowel sounds normal; no masses,  no organomegaly Extremities: extremities normal, atraumatic, no cyanosis or edema Pulses: 2+ and symmetric Skin: Skin color, texture, turgor normal. No rashes or lesions  Lymphatic examination noted a palpable inguinal adenopathy on the left but no lymphadenopathy noted anywhere else.   Ct Abdomen Pelvis W Contrast  05/31/2014   CLINICAL DATA:  Upper abdominal pain for 1 week. Questionable hernia.  EXAM: CT ABDOMEN AND PELVIS WITH CONTRAST  TECHNIQUE: Multidetector CT imaging of the abdomen and pelvis was performed using the standard protocol following bolus administration of intravenous contrast.  CONTRAST:  68mL OMNIPAQUE IOHEXOL 300 MG/ML SOLN, 165mL OMNIPAQUE IOHEXOL 300 MG/ML SOLN  COMPARISON:  None  FINDINGS: There is extensive adenopathy. Enlarged lymph nodes are noted along the periceliac, gastrohepatic, peripancreatic and retroperitoneal lymph node chains. Several reference measurements were made. There is an 11 mm short axis left periceliac node. There are 19 mm anterior and a 2.1 cm posterior gastrohepatic ligament chain nodes. There is a mixed attenuation mass, likely a mesenteric lymph node, adjacent to a loop of jejunum measuring 2.6 cm x 2.3 cm. There is bulky retroperitoneal adenopathy. A left periaortic node at the level of the midpole of the left kidney measures 2 cm in short axis. A left common iliac chain node measures 16 mm short axis. A left external iliac chain node measures 2 cm in short axis with a node along the posterior margin of the external iliac vessels measuring 2.1 cm in short axis. There is milder right pelvic adenopathy. There is left inguinal adenopathy. Largest node measures 3 cm x 1.9 cm in size. There is edema in the left lower extremity likely left external iliac vein compression. Thrombosis is possible but not visualized.  Minimal left pleural effusion with associated coarse reticular left lower lobe  opacity, likely atelectasis. Lung bases are otherwise clear. No lung base nodules. Heart is borderline enlarged.  There is a circumscribed oval mass projecting along the posterior margin of the posterior segment of the right lobe of the liver. It measures 6.2 cm x 4.5 cm x 6.3 cm. It has average Hounsfield units of 43 on the initial post contrast series and 48 on the delayed series. An enhancing vessel is displaced over the superior margin of this mass. A 1 cm cyst lies in the central liver adjacent to the intrahepatic inferior vena cava no other liver masses or lesions.  Spleen, gallbladder and pancreas are unremarkable. No bile duct dilation. No adrenal masses. 17 mm cyst along the posterior midpole of the left kidney with a small wall calcification. Tiny low-density right renal lesion is likely a an additional cyst. Kidneys are otherwise unremarkable. No hydronephrosis. Ureters are normal course and caliber bladder is unremarkable. Uterus is surgically absent. There are colonic diverticula. No diverticulitis. No bowel wall thickening. No bowel masses.  No dilation to suggest obstruction. Normal small bowel. Normal appendix. No ascites. No osteoblastic or osteolytic lesions.  No evidence of an abdominal wall hernia.  IMPRESSION: 1. No acute findings.  No abdominal wall hernia. 2. Extensive adenopathy as detailed above. Given the lack of a history of a primary carcinoma, this is most suspicious for lymphoma. 3. 6.3 cm oval circumscribed mass along the posterior segment of the right lobe of the liver. This is not a simple cyst. It may reflect a complicated cyst. Metastatic disease is possible. 4. No other evidence of metastatic disease. 5. Multiple chronic findings as detailed above.   Electronically Signed   By: Lajean Manes M.D.   On: 05/31/2014 15:17    Assessment and Plan:   80 year old woman with:  1. Incidental finding of diffuse lymphadenopathy noted on a CT scan. She has rather diffuse abdominal,  retroperitoneal and pelvic adenopathy with a rather bulky disease in the retroperitoneum. She also has inguinal adenopathy as well the largest measuring 3.1 cm. The differential diagnosis was discussed with the patient and her family. This most likely represents lymphoma although other etiologies could be considered. Metastatic carcinoma into the lymph glands are a possibility but less likely. Reactive lymphadenopathy is unlikely as well. Chronic inflammatory condition such as sarcoidosis would be a possibility as well. In order to make a definitive diagnosis she will meet an excisional biopsy to be done surgically. I will refer her to Gen. surgery to obtain an excisional biopsy potentially of her left inguinal area. One step procedure is done, we'll be able to determine what kind of lymphoma we are dealing with which is very critical to this discussion. In order to determine the exact kind of lymphoma, an excisional biopsy is important. I will also obtain a PET CT scan for completeness and staging purposes. She'll followup with me after the completion of these studies.  2. Liver mass: This could represent a complex cyst but could also represents lymphoma involvement in the liver. A PET scan would help determine depending on the activity by FDG uptake.  All her questions and her family's questions were answered today.

## 2014-06-07 NOTE — Telephone Encounter (Addendum)
Pt confirmed labs/ov per 08/13 POF, gave pt AVS spk w/Sonya at [place] made referral apt.Marland Kitchen..KJ

## 2014-06-07 NOTE — Progress Notes (Signed)
Please see consult note.  

## 2014-06-14 ENCOUNTER — Encounter (HOSPITAL_COMMUNITY): Payer: Self-pay

## 2014-06-14 ENCOUNTER — Ambulatory Visit (HOSPITAL_COMMUNITY)
Admission: RE | Admit: 2014-06-14 | Discharge: 2014-06-14 | Disposition: A | Discharge status: Home / Self Care | Payer: Medicare Other | Source: Ambulatory Visit | Attending: Oncology | Admitting: Oncology

## 2014-06-14 DIAGNOSIS — K7689 Other specified diseases of liver: Secondary | ICD-10-CM | POA: Diagnosis not present

## 2014-06-14 DIAGNOSIS — D47Z9 Other specified neoplasms of uncertain behavior of lymphoid, hematopoietic and related tissue: Secondary | ICD-10-CM | POA: Diagnosis not present

## 2014-06-14 LAB — GLUCOSE, CAPILLARY: Glucose-Capillary: 97 mg/dL (ref 70–99)

## 2014-06-14 MED ORDER — FLUDEOXYGLUCOSE F - 18 (FDG) INJECTION: 10.2000 | Freq: Once | INTRAVENOUS | Status: AC | PRN

## 2014-06-21 ENCOUNTER — Encounter (INDEPENDENT_AMBULATORY_CARE_PROVIDER_SITE_OTHER): Payer: Self-pay | Admitting: General Surgery

## 2014-06-21 ENCOUNTER — Ambulatory Visit (INDEPENDENT_AMBULATORY_CARE_PROVIDER_SITE_OTHER): Payer: Medicare Other | Admitting: General Surgery

## 2014-06-21 VITALS — BP 128/80 | HR 72 | Temp 97.5°F | Ht 64.0 in | Wt 147.0 lb

## 2014-06-21 DIAGNOSIS — R599 Enlarged lymph nodes, unspecified: Secondary | ICD-10-CM

## 2014-06-21 NOTE — Patient Instructions (Signed)
Plan for left inguinal lymph node biopsy

## 2014-06-21 NOTE — Progress Notes (Signed)
Patient ID: Connie Mccann, female   DOB: 1931/03/20, 80 y.o.   MRN: [id number]  No chief complaint on file.   HPI Connie Mccann is a 80 y.o. female.  We are asked to see the patient in consultation by Dr. Osker Mason to evaluate her for possible lymphoma. The patient is an 80 year old black female who recently presented with pain in her left groin. She is noted to loss of several pounds over the last 6 months. As part of her workup she underwent a CT scan which showed diffusely enlarged lymph nodes. We are asked to biopsy one of these lymph nodes to help make a diagnosis.  HPI  Past Medical History  Diagnosis Date  . Hypertension   . Asthma     History reviewed. No pertinent past surgical history.  History reviewed. No pertinent family history.  Social History History  Substance Use Topics  . Smoking status: Never Smoker   . Smokeless tobacco: Not on file  . Alcohol Use: No    No Known Allergies  Current Outpatient Prescriptions  Medication Sig Dispense Refill  . albuterol (PROAIR HFA) 108 (90 BASE) MCG/ACT inhaler Inhale 2 puffs into the lungs every 4 (four) hours as needed for wheezing or shortness of breath.  1 Inhaler  11  . amLODipine (NORVASC) 10 MG tablet Take 1 tablet (10 mg total) by mouth daily.  90 tablet  3  . beclomethasone (QVAR) 80 MCG/ACT inhaler Inhale 1 puff into the lungs 2 (two) times daily.  1 Inhaler  5  . losartan (COZAAR) 50 MG tablet Take 1 tablet (50 mg total) by mouth daily. Take as needed for blood pressure  >150.  90 tablet  0  . Multiple Vitamin (MULTIVITAMIN) tablet Take 1 tablet by mouth daily.      . Multiple Vitamins-Minerals (OCUVITE EYE + MULTI) TABS Take 1 tablet by mouth daily.       No current facility-administered medications for this visit.    Review of Systems Review of Systems  Constitutional: Positive for unexpected weight change.  HENT: Negative.   Eyes: Negative.   Respiratory: Negative.   Cardiovascular: Negative.    Gastrointestinal: Negative.   Endocrine: Negative.   Genitourinary: Negative.   Musculoskeletal: Negative.   Skin: Negative.   Allergic/Immunologic: Negative.   Neurological: Negative.   Hematological: Negative.   Psychiatric/Behavioral: Positive for confusion.    Blood pressure 128/80, pulse 72, temperature 97.5 F (36.4 C), height 5\' 4"  (1.626 m), weight 147 lb (66.679 kg).  Physical Exam Physical Exam  Constitutional: She is oriented to person, place, and time. She appears well-developed and well-nourished.  HENT:  Head: Normocephalic and atraumatic.  Eyes: Conjunctivae and EOM are normal. Pupils are equal, round, and reactive to light.  Neck: Normal range of motion. Neck supple.  Cardiovascular: Normal rate, regular rhythm and normal heart sounds.   Pulmonary/Chest: Effort normal and breath sounds normal.  Abdominal: Soft. Bowel sounds are normal.  Musculoskeletal: Normal range of motion.  There were palpably enlarged lymph nodes in the left groin  Lymphadenopathy:    She has no cervical adenopathy.  Neurological: She is alert and oriented to person, place, and time.  Skin: Skin is warm and dry.  Psychiatric: She has a normal mood and affect. Her behavior is normal.    Data Reviewed As above  Assessment    The patient has enlargement of multiple areas of lymph nodes worrisome for lymphoma.     Plan    Plan is  for a left groin lymph node biopsy to rule out lymphoma. I've discussed with her in detail the risks and benefits of the operation to do this as well as some of the technical aspects and she understands and wishes to proceed        TOTH III,PAUL S 06/21/2014, 11:20 AM

## 2014-06-25 ENCOUNTER — Telehealth: Payer: Self-pay | Admitting: Medical Oncology

## 2014-06-25 NOTE — Telephone Encounter (Signed)
Daughter called concerned that patient's original biopsy appt was scheduled for the 3rd and then Dr. Ethlyn Gallery office called her to r/s for the Sept 21st. Daughter concerned that appt too far off and that patient's pain can return. Asking if this is adequate time or do they need a referral for another physician? Inquiring as to whether a BM biopsy is in Dr. Hazeline Junker plan?  Suggested to Daughter to call Dr. Ethlyn Gallery office and ask whether they can schedule appt sooner, and to explain rational for waiting. Daughter expressed thanks, states will call office and return call us to inform.  Appt with Dr. Marlou Starks 08/27 F/u sched with Dr. Alen Blew 09/04  Message routed to  Dr. Alen Blew for review.

## 2014-06-26 NOTE — Telephone Encounter (Signed)
I will discuss with her on 9/4.Please make sure she comes to the appointment.

## 2014-06-27 ENCOUNTER — Encounter: Payer: Self-pay | Admitting: *Deleted

## 2014-06-27 NOTE — Telephone Encounter (Signed)
F/U call with daughter regarding biopsy and upcoming appt on the 4th with Dr. Alen Blew. Daughter informed me that mother's/patient's biopsy has been moved to 09/10 with Dr. Marlou Starks. Daughter asking did Dr. Alen Blew still want pt to come for appt on the 4th.  Mssg routed to MD

## 2014-06-27 NOTE — Progress Notes (Signed)
Patient's daughter yevette calling to say lymph node biopsy has been postponed to 07/05/14. pof to schedulers to move 9/4 appt to 9/17. Patient and family aware of appt change.

## 2014-06-29 ENCOUNTER — Telehealth: Payer: Self-pay | Admitting: Oncology

## 2014-06-29 ENCOUNTER — Ambulatory Visit: Payer: Self-pay | Admitting: Oncology

## 2014-06-29 NOTE — Telephone Encounter (Signed)
per pof r/s pt...done...Marland Kitchenper pof pt aware

## 2014-07-05 ENCOUNTER — Other Ambulatory Visit (INDEPENDENT_AMBULATORY_CARE_PROVIDER_SITE_OTHER): Payer: Self-pay | Admitting: General Surgery

## 2014-07-11 ENCOUNTER — Telehealth: Payer: Self-pay | Admitting: *Deleted

## 2014-07-11 NOTE — Telephone Encounter (Signed)
Spoke with patient. Results are back and dr Alen Blew would like for her to keep her regularly scheduled appt for 07/12/14. Instructed her to add her daughter's name to the hippa list so we may converse with her.

## 2014-07-12 ENCOUNTER — Ambulatory Visit (HOSPITAL_BASED_OUTPATIENT_CLINIC_OR_DEPARTMENT_OTHER): Payer: Medicare Other | Admitting: Oncology

## 2014-07-12 ENCOUNTER — Encounter: Payer: Self-pay | Admitting: Oncology

## 2014-07-12 ENCOUNTER — Telehealth: Payer: Self-pay | Admitting: Oncology

## 2014-07-12 VITALS — BP 141/57 | HR 86 | Temp 98.6°F | Resp 18 | Ht 64.0 in | Wt 146.8 lb

## 2014-07-12 DIAGNOSIS — K7689 Other specified diseases of liver: Secondary | ICD-10-CM

## 2014-07-12 DIAGNOSIS — R599 Enlarged lymph nodes, unspecified: Secondary | ICD-10-CM

## 2014-07-12 NOTE — Telephone Encounter (Signed)
Pt confirmed labs/ov per 09/17 POF, gave pt AVS...KJ °

## 2014-07-12 NOTE — Progress Notes (Signed)
Hematology and Oncology Follow Up Visit  Connie Mccann [id number] Apr 14, 1931 80 y.o. 07/12/2014 8:32 AM Delman Cheadle, MDShaw, Laurey Arrow, MD   Principle Diagnosis: 80 year old woman with diffuse lymphadenopathy suspicious for lymphoma. This was discovered in August of 2015.   Prior Therapy: She is status post left inguinal biopsy which showed lymphoid hyperplasia without any malignancy and on 07/05/2014.  Current therapy: Observation and surveillance.  Interim History: Connie Mccann presents today for a followup visit. Since her last visit, she underwent an inguinal excisional biopsy on 07/05/2014 tolerated it well. She still reporting some pain around that area but is recovering nicely. She has not reported any other palpable adenopathy at this time. She reports no fevers or chills or sweats. She reports her appetite have been within normal range without any weight loss or appetite changes. She does not report any headaches or blurry vision or syncope. What appears to have some memory issues. She does not report any chest pain orthopnea or PND. She does report some occasional wheezing. She does not report any nausea, vomiting, early satiety she does report left inguinal discomfort. She does not report any frequency urgency or hesitancy. Is not reporting any skeletal complaints. Rest of her review of systems unremarkable.     Medications: I have reviewed the patient's current medications.  Current Outpatient Prescriptions  Medication Sig Dispense Refill  . albuterol (PROAIR HFA) 108 (90 BASE) MCG/ACT inhaler Inhale 2 puffs into the lungs every 4 (four) hours as needed for wheezing or shortness of breath.  1 Inhaler  11  . amLODipine (NORVASC) 10 MG tablet Take 1 tablet (10 mg total) by mouth daily.  90 tablet  3  . beclomethasone (QVAR) 80 MCG/ACT inhaler Inhale 1 puff into the lungs 2 (two) times daily.  1 Inhaler  5  . losartan (COZAAR) 50 MG tablet Take 1 tablet (50 mg total) by mouth daily. Take as  needed for blood pressure  >150.  90 tablet  0  . Multiple Vitamin (MULTIVITAMIN) tablet Take 1 tablet by mouth daily.      . Multiple Vitamins-Minerals (OCUVITE EYE + MULTI) TABS Take 1 tablet by mouth daily.       No current facility-administered medications for this visit.     Allergies: No Known Allergies  Past Medical History, Surgical history, Social history, and Family History were reviewed and updated.   Physical Exam: Blood pressure 141/57, pulse 86, temperature 98.6 F (37 C), temperature source Oral, resp. rate 18, height 5\' 4"  (1.626 m), weight 146 lb 12.8 oz (66.588 kg), SpO2 100.00%. ECOG: 0 General appearance: alert and cooperative Head: Normocephalic, without obvious abnormality Neck: no adenopathy Lymph nodes: Cervical, supraclavicular, and axillary nodes normal. Heart:regular rate and rhythm, S1, S2 normal, no murmur, click, rub or gallop Lung:chest clear, no wheezing, rales, normal symmetric air entry Abdomin: soft, non-tender, without masses or organomegaly EXT:no erythema, induration, or nodules   Lab Results: Lab Results  Component Value Date   WBC 8.6 05/31/2014   HGB 13.2 05/31/2014   HCT 41.1 05/31/2014   MCV 78.4* 05/31/2014     Chemistry      Component Value Date/Time   NA 140 11/16/2013 1125   K 4.4 11/16/2013 1125   CL 104 11/16/2013 1125   CO2 27 11/16/2013 1125   BUN 13 05/31/2014 1342   CREATININE 1.03 05/31/2014 1342   CREATININE 0.96 05/31/2014 1014      Component Value Date/Time   CALCIUM 9.9 11/16/2013 1125   ALKPHOS 85  06/23/2013 1137   AST 21 06/23/2013 1137   ALT 19 06/23/2013 1137   BILITOT 0.7 06/23/2013 1137       Radiological Studies:  EXAM:  NUCLEAR MEDICINE PET SKULL BASE TO THIGH  TECHNIQUE:  8.1 mCi F-18 FDG was injected intravenously. Full-ring PET imaging  was performed from the skull base to thigh after the radiotracer. CT  data was obtained and used for attenuation correction and anatomic  localization.  FASTING BLOOD  GLUCOSE: Value: 97 mg/dl  COMPARISON: AP CT on 06/10/2014  FINDINGS:  NECK  Mild bilateral level 2 jugular lymphadenopathy is seen with  hypermetabolic activity, with SUV max on the right measuring 4.6 and  on the left measuring 3.5. Mild right supraclavicular hypermetabolic  lymphadenopathy also seen with SUV max of 3.5.  CHEST  No hypermetabolic hilar lymph nodes identified. Mild hypermetabolic  mediastinal adenopathy seen in the right cardiophrenic angle only.  Mild bilateral axillary lymphadenopathy is seen which shows  hypermetabolic activity. SUV max in the right axillary region  measures 2.6, and SUV max in the left axillary region measures 2.9.  No suspicious pulmonary nodules on the CT scan.  ABDOMEN/PELVIS  6 cm low-attenuation lesion in the posterior right hepatic lobe  shows absence of metabolic activity centrally, however there is  metabolic activity in the periphery of this lesion which is similar  to background hepatic parenchyma. This is suspicious for neoplasm.  Mild hypermetabolic abdominal lymphadenopathy is seen in the porta  hepatis, gastrohepatic ligament, retroperitoneum and mesentery. Most  hypermetabolic lymph node in the base of the small bowel mesentery  has SUV max of 8.6.  Mildly Hypermetabolic lymphadenopathy is also seen in the pelvis  along the left common and external iliac chains. Hypermetabolic  adenopathy is also seen in the left inguinal region, with SUV max  measuring 4.2.  SKELETON  No focal hypermetabolic activity to suggest skeletal metastasis.  IMPRESSION:  Mild hypermetabolic lymphadenopathy in the neck, chest, abdomen, and  pelvis, suspicious for lymphoproliferative disorder.  6 cm posterior right hepatic lobe lesion shows evidence of metabolic  activity peripherally, and is suspicious for neoplasm. Abdomen MRI  without and with contrast recommended for further characterization.   Impression and Plan:  80 year old woman with the  following issues:  1. Diffuse lymphadenopathy discovered incidentally with a routine CT scan in August of 2015. Her PET/CT scan done on 03/14/2014 as well as the results of her pathology on 07/05/2014 were discussed. A PET scan showed the metabolic activity in the neck chest abdomen and pelvis is very mild and likely represents low-grade lymphoproliferative disorder. The biopsy on 67/89/3810 showed follicular hyperplasia without any malignancy. Options of treatment were discussed today with the patient and her family. One option is to repeat biopsy which means another surgical procedure to confirm the presence of lymphoma versus an interim scan in about 4 months or so to assess changes in her lymphadenopathy. After discussing the risks and benefits we have elected to proceed with an interim scan. If she develops any symptomatology including fevers, chills, weight loss, painful adenopathy there was moved that scan sooner and expedite the biopsy.  2. Right hepatic lobe lesion measuring 6 cm with a bulk of it does not show any hypermetabolic activity but certainly have some concerning features for malignancy. We'll continue to monitor this as well and repeat imaging studies will certainly give [place] an idea about any interval growth of this lesion.  3. Followup: Will be in 3 months to assess her clinically  at that time.  All questions are answered today to her satisfaction.  [facility], MD 9/17/20158:32 AM

## 2014-07-17 ENCOUNTER — Other Ambulatory Visit: Payer: Self-pay | Admitting: Family Medicine

## 2014-07-24 ENCOUNTER — Other Ambulatory Visit: Payer: Self-pay

## 2014-07-24 MED ORDER — AMLODIPINE BESYLATE 10 MG PO TABS: ORAL_TABLET | ORAL | Status: DC

## 2014-10-04 ENCOUNTER — Telehealth: Payer: Self-pay | Admitting: *Deleted

## 2014-10-04 NOTE — Telephone Encounter (Signed)
Spoke with pt, she states that she received flu shot, but is unsure date.

## 2014-10-10 ENCOUNTER — Ambulatory Visit: Payer: Self-pay | Admitting: Oncology

## 2014-10-10 ENCOUNTER — Other Ambulatory Visit: Payer: Self-pay

## 2014-10-20 ENCOUNTER — Emergency Department (HOSPITAL_COMMUNITY): Payer: Medicare Other

## 2014-10-20 ENCOUNTER — Ambulatory Visit (INDEPENDENT_AMBULATORY_CARE_PROVIDER_SITE_OTHER): Payer: Medicare Other | Admitting: Emergency Medicine

## 2014-10-20 ENCOUNTER — Inpatient Hospital Stay (HOSPITAL_COMMUNITY): Payer: Medicare Other

## 2014-10-20 ENCOUNTER — Inpatient Hospital Stay (HOSPITAL_COMMUNITY)
Admission: EM | Admit: 2014-10-20 | Discharge: 2014-10-21 | DRG: 814 | Disposition: A | Discharge status: Home / Self Care | Payer: Medicare Other | Attending: Internal Medicine | Admitting: Internal Medicine

## 2014-10-20 ENCOUNTER — Ambulatory Visit (INDEPENDENT_AMBULATORY_CARE_PROVIDER_SITE_OTHER): Payer: Medicare Other

## 2014-10-20 ENCOUNTER — Encounter (HOSPITAL_COMMUNITY): Payer: Self-pay | Admitting: Emergency Medicine

## 2014-10-20 VITALS — BP 138/74 | HR 77 | Temp 97.9°F | Resp 16 | Ht 64.3 in | Wt 159.2 lb

## 2014-10-20 DIAGNOSIS — R591 Generalized enlarged lymph nodes: Principal | ICD-10-CM | POA: Diagnosis present

## 2014-10-20 DIAGNOSIS — I2699 Other pulmonary embolism without acute cor pulmonale: Secondary | ICD-10-CM

## 2014-10-20 DIAGNOSIS — E8809 Other disorders of plasma-protein metabolism, not elsewhere classified: Secondary | ICD-10-CM | POA: Diagnosis present

## 2014-10-20 DIAGNOSIS — R609 Edema, unspecified: Secondary | ICD-10-CM

## 2014-10-20 DIAGNOSIS — R0602 Shortness of breath: Secondary | ICD-10-CM

## 2014-10-20 DIAGNOSIS — Z79899 Other long term (current) drug therapy: Secondary | ICD-10-CM

## 2014-10-20 DIAGNOSIS — I5033 Acute on chronic diastolic (congestive) heart failure: Secondary | ICD-10-CM | POA: Diagnosis present

## 2014-10-20 DIAGNOSIS — E119 Type 2 diabetes mellitus without complications: Secondary | ICD-10-CM | POA: Diagnosis present

## 2014-10-20 DIAGNOSIS — R601 Generalized edema: Secondary | ICD-10-CM | POA: Diagnosis present

## 2014-10-20 DIAGNOSIS — I890 Lymphedema, not elsewhere classified: Secondary | ICD-10-CM

## 2014-10-20 DIAGNOSIS — J45909 Unspecified asthma, uncomplicated: Secondary | ICD-10-CM | POA: Diagnosis present

## 2014-10-20 DIAGNOSIS — C8590 Non-Hodgkin lymphoma, unspecified, unspecified site: Secondary | ICD-10-CM | POA: Diagnosis present

## 2014-10-20 DIAGNOSIS — I10 Essential (primary) hypertension: Secondary | ICD-10-CM | POA: Diagnosis present

## 2014-10-20 LAB — URINALYSIS, ROUTINE W REFLEX MICROSCOPIC
Bilirubin Urine: NEGATIVE
GLUCOSE, UA: NEGATIVE mg/dL
Hgb urine dipstick: NEGATIVE
Ketones, ur: NEGATIVE mg/dL
Nitrite: NEGATIVE
Protein, ur: NEGATIVE mg/dL
Specific Gravity, Urine: 1.011 (ref 1.005–1.030)
UROBILINOGEN UA: 0.2 mg/dL (ref 0.0–1.0)
pH: 6 (ref 5.0–8.0)

## 2014-10-20 LAB — POCT CBC
Granulocyte percent: 52.6 %G (ref 37–80)
HCT, POC: 38.7 % (ref 37.7–47.9)
Hemoglobin: 12.1 g/dL — AB (ref 12.2–16.2)
Lymph, poc: 2.1 (ref 0.6–3.4)
MCH, POC: 24.2 pg — AB (ref 27–31.2)
MCHC: 31.3 g/dL — AB (ref 31.8–35.4)
MCV: 77.2 fL — AB (ref 80–97)
MID (cbc): 0.9 (ref 0–0.9)
MPV: 8.1 fL (ref 0–99.8)
POC Granulocyte: 3.4 (ref 2–6.9)
POC LYMPH PERCENT: 33 %L (ref 10–50)
POC MID %: 14.4 %M — AB (ref 0–12)
Platelet Count, POC: 263 10*3/uL (ref 142–424)
RBC: 5.01 M/uL (ref 4.04–5.48)
RDW, POC: 16.6 %
WBC: 6.4 10*3/uL (ref 4.6–10.2)

## 2014-10-20 LAB — CBC WITH DIFFERENTIAL/PLATELET
BASOS PCT: 1 % (ref 0–1)
Basophils Absolute: 0.1 10*3/uL (ref 0.0–0.1)
EOS ABS: 1.5 10*3/uL — AB (ref 0.0–0.7)
Eosinophils Relative: 25 % — ABNORMAL HIGH (ref 0–5)
HCT: 36.2 % (ref 36.0–46.0)
HEMOGLOBIN: 12.2 g/dL (ref 12.0–15.0)
Lymphocytes Relative: 34 % (ref 12–46)
Lymphs Abs: 2.1 10*3/uL (ref 0.7–4.0)
MCH: 25.4 pg — AB (ref 26.0–34.0)
MCHC: 33.7 g/dL (ref 30.0–36.0)
MCV: 75.4 fL — ABNORMAL LOW (ref 78.0–100.0)
MONOS PCT: 9 % (ref 3–12)
Monocytes Absolute: 0.5 10*3/uL (ref 0.1–1.0)
NEUTROS ABS: 1.9 10*3/uL (ref 1.7–7.7)
NEUTROS PCT: 32 % — AB (ref 43–77)
PLATELETS: 268 10*3/uL (ref 150–400)
RBC: 4.8 MIL/uL (ref 3.87–5.11)
RDW: 16.4 % — ABNORMAL HIGH (ref 11.5–15.5)
WBC: 6.1 10*3/uL (ref 4.0–10.5)

## 2014-10-20 LAB — COMPREHENSIVE METABOLIC PANEL
ALBUMIN: 3.3 g/dL — AB (ref 3.5–5.2)
ALK PHOS: 55 U/L (ref 39–117)
ALT: 12 U/L (ref 0–35)
ANION GAP: 5 (ref 5–15)
AST: 22 U/L (ref 0–37)
BUN: 11 mg/dL (ref 6–23)
CHLORIDE: 106 meq/L (ref 96–112)
CO2: 24 mmol/L (ref 19–32)
Calcium: 9.3 mg/dL (ref 8.4–10.5)
Creatinine, Ser: 0.77 mg/dL (ref 0.50–1.10)
GFR calc Af Amer: 89 mL/min — ABNORMAL LOW (ref >90)
GFR calc non Af Amer: 77 mL/min — ABNORMAL LOW (ref >90)
Glucose, Bld: 101 mg/dL — ABNORMAL HIGH (ref 70–99)
POTASSIUM: 4.1 mmol/L (ref 3.5–5.1)
SODIUM: 135 mmol/L (ref 135–145)
TOTAL PROTEIN: 8 g/dL (ref 6.0–8.3)
Total Bilirubin: 0.8 mg/dL (ref 0.3–1.2)

## 2014-10-20 LAB — CBG MONITORING, ED: GLUCOSE-CAPILLARY: 89 mg/dL (ref 70–99)

## 2014-10-20 LAB — CK: Total CK: 74 U/L (ref 7–177)

## 2014-10-20 LAB — TSH: TSH: 3.105 u[IU]/mL (ref 0.350–4.500)

## 2014-10-20 LAB — BRAIN NATRIURETIC PEPTIDE
B NATRIURETIC PEPTIDE 5: 89.8 pg/mL (ref 0.0–100.0)
B Natriuretic Peptide: 79.5 pg/mL (ref 0.0–100.0)

## 2014-10-20 LAB — COMPLETE METABOLIC PANEL WITH GFR
ALT: 8 U/L (ref 0–35)
AST: 17 U/L (ref 0–37)
Albumin: 3.2 g/dL — ABNORMAL LOW (ref 3.5–5.2)
Alkaline Phosphatase: 53 U/L (ref 39–117)
BUN: 11 mg/dL (ref 6–23)
CHLORIDE: 105 meq/L (ref 96–112)
CO2: 26 mEq/L (ref 19–32)
Calcium: 9.5 mg/dL (ref 8.4–10.5)
Creat: 0.78 mg/dL (ref 0.50–1.10)
GFR, Est African American: 82 mL/min
GFR, Est Non African American: 72 mL/min
Glucose, Bld: 100 mg/dL — ABNORMAL HIGH (ref 70–99)
Potassium: 3.9 mEq/L (ref 3.5–5.3)
Sodium: 136 mEq/L (ref 135–145)
Total Bilirubin: 0.3 mg/dL (ref 0.2–1.2)
Total Protein: 7.6 g/dL (ref 6.0–8.3)

## 2014-10-20 LAB — LACTATE DEHYDROGENASE: LDH: 275 U/L — ABNORMAL HIGH (ref 94–250)

## 2014-10-20 LAB — TROPONIN I
Troponin I: <0.03 ng/mL (ref <0.031)
Troponin I: <0.03 ng/mL (ref <0.031)

## 2014-10-20 LAB — POCT SEDIMENTATION RATE: POCT SED RATE: 60 mm/h — AB (ref 0–22)

## 2014-10-20 LAB — URINE MICROSCOPIC-ADD ON

## 2014-10-20 LAB — URIC ACID: URIC ACID, SERUM: 6 mg/dL (ref 2.4–7.0)

## 2014-10-20 LAB — MAGNESIUM: Magnesium: 1.6 mg/dL (ref 1.5–2.5)

## 2014-10-20 MED ORDER — IOHEXOL 300 MG/ML  SOLN
100.0000 mL | Freq: Once | INTRAMUSCULAR | Status: AC | PRN
  Administered 2014-10-20: 100 mL via INTRAVENOUS

## 2014-10-20 MED ORDER — ONDANSETRON HCL 4 MG/2ML IJ SOLN: 4.0000 mg | Freq: Four times a day (QID) | INTRAMUSCULAR | Status: DC | PRN

## 2014-10-20 MED ORDER — ONDANSETRON HCL 4 MG PO TABS: 4.0000 mg | ORAL_TABLET | Freq: Four times a day (QID) | ORAL | Status: DC | PRN

## 2014-10-20 MED ORDER — ACETAMINOPHEN 650 MG RE SUPP: 650.0000 mg | Freq: Four times a day (QID) | RECTAL | Status: DC | PRN

## 2014-10-20 MED ORDER — SODIUM CHLORIDE 0.9 % IJ SOLN: 3.0000 mL | Freq: Two times a day (BID) | INTRAMUSCULAR | Status: DC

## 2014-10-20 MED ORDER — SODIUM CHLORIDE 0.9 % IJ SOLN
3.0000 mL | Freq: Two times a day (BID) | INTRAMUSCULAR | Status: DC
  Administered 2014-10-20: 3 mL via INTRAVENOUS

## 2014-10-20 MED ORDER — HYDROMORPHONE HCL 1 MG/ML IJ SOLN: 0.5000 mg | INTRAMUSCULAR | Status: DC | PRN

## 2014-10-20 MED ORDER — SODIUM CHLORIDE 0.9 % IV SOLN: 250.0000 mL | INTRAVENOUS | Status: DC | PRN

## 2014-10-20 MED ORDER — LEVALBUTEROL HCL 0.63 MG/3ML IN NEBU: 0.6300 mg | INHALATION_SOLUTION | Freq: Four times a day (QID) | RESPIRATORY_TRACT | Status: DC | PRN

## 2014-10-20 MED ORDER — ACETAMINOPHEN 325 MG PO TABS: 650.0000 mg | ORAL_TABLET | Freq: Four times a day (QID) | ORAL | Status: DC | PRN

## 2014-10-20 MED ORDER — FUROSEMIDE 10 MG/ML IJ SOLN
40.0000 mg | Freq: Once | INTRAMUSCULAR | Status: AC
  Administered 2014-10-20: 40 mg via INTRAVENOUS
  Filled 2014-10-20: qty 4

## 2014-10-20 MED ORDER — ENOXAPARIN SODIUM 40 MG/0.4ML ~~LOC~~ SOLN
40.0000 mg | SUBCUTANEOUS | Status: DC
  Administered 2014-10-20: 40 mg via SUBCUTANEOUS
  Filled 2014-10-20: qty 0.4

## 2014-10-20 MED ORDER — SODIUM CHLORIDE 0.9 % IJ SOLN: 3.0000 mL | INTRAMUSCULAR | Status: DC | PRN

## 2014-10-20 NOTE — ED Provider Notes (Signed)
Medical screening examination/treatment/procedure(s) were conducted as a shared visit with non-physician practitioner(s) and myself.  I personally evaluated the patient during the encounter.   EKG Interpretation   Date/Time:  Saturday October 20 2014 14:18:41 EST Ventricular Rate:  68 PR Interval:  140 QRS Duration: 100 QT Interval:  415 QTC Calculation: 441 R Axis:   -31 Text Interpretation:  Sinus rhythm Low voltage, precordial leads Left  ventricular hypertrophy No significant change was found Confirmed by  CAMPOS  MD, Lennette Bihari ([id number]) on 10/20/2014 2:21:94 PM      80 year old female who presents with bilateral upper and bilateral lower extreme swelling. She was sent from urgent care after concern for lymphatic obstruction secondary to cancer. No chest pain or shortness of breath.  On exam, well appearing, nontoxic, not distressed, normal respiratory effort, normal perfusion, lungs clear with no rales, 2+ pitting edema bilateral lower extremity, 1+ nonpitting edema bilateral upper extremity.  [facility] [place] discussed with oncology who recommended admission for further workup.    Clinical Impression: 1. Edema     Artis Delay, MD 10/20/14 [phone]

## 2014-10-20 NOTE — Progress Notes (Addendum)
Subjective:    Patient ID: Connie Mccann, female    DOB: 10-Dec-1930, 80 y.o.   MRN: [id number] This chart was scribed for Arlyss Queen, MD by Zola Button, Medical Scribe. This patient was seen in room 8 and the patient's care was started at 9:35 AM.   HPI HPI Comments: Connie Mccann is a 80 y.o. female who presents to the [facility] complaining of rashes to her bilateral arms, hands and legs with swelling that started about 2 weeks ago. The swelling first started in her legs, and she states the swelling has been gradually getting worse. Patient has not yet had a CXR. She denies pain anywhere else in her body.  She has been seeing Dr. Alen Blew, oncologist, and will next see him 11/14/14. He has not found anything abnormal.  PCP: Dr. Brigitte Pulse  Review of Systems  Cardiovascular: Positive for leg swelling. Negative for chest pain.  Gastrointestinal: Negative for abdominal pain.  Musculoskeletal: Negative for neck pain.  Skin: Positive for rash.       Objective:   Physical Exam CONSTITUTIONAL: Well developed/well nourished. Alert and cooperative. HEAD: Normocephalic/atraumatic EYES: EOM/PERRL ENMT: Mucous membranes moist NECK: supple no meningeal signs. No JVD. SPINE: entire spine nontender CV: S1/S2 noted, no murmurs/rubs/gallops noted. Chest normal. LUNGS: Lungs are clear to auscultation bilaterally, no apparent distress ABDOMEN: soft, nontender, no rebound or guarding. Abdomen without masses. GU: no cva tenderness NEURO: Pt is awake/alert, moves all extremitiesx4 EXTREMITIES: Swelling of both forearms and upper arms. Swelling of both lower extremities. Pitting edema both legs. SKIN: warm, color normal. Multiple small nodes in both axilla 1x1 cm. PSYCH: no abnormalities of mood noted  Results for orders placed or performed in visit on 10/20/14  POCT CBC  Result Value Ref Range   WBC 6.4 4.6 - 10.2 K/uL   Lymph, poc 2.1 0.6 - 3.4   POC LYMPH PERCENT 33.0 10 - 50  %L   MID (cbc) 0.9 0 - 0.9   POC MID % 14.4 (A) 0 - 12 %M   POC Granulocyte 3.4 2 - 6.9   Granulocyte percent 52.6 37 - 80 %G   RBC 5.01 4.04 - 5.48 M/uL   Hemoglobin 12.1 (A) 12.2 - 16.2 g/dL   HCT, POC 38.7 37.7 - 47.9 %   MCV 77.2 (A) 80 - 97 fL   MCH, POC 24.2 (A) 27 - 31.2 pg   MCHC 31.3 (A) 31.8 - 35.4 g/dL   RDW, POC 16.6 %   Platelet Count, POC 263 142 - 424 K/uL   MPV 8.1 0 - 99.8 fL   [facility] reading (PRIMARY) by  Dr. Everlene Farrier there is prominence of the right hilum. There is also a nodular area superior to the hilum.          Assessment & Plan:  This patient had CT scan done 05/31/2014. The findings on the CT scan were extensive lymphadenopathy very concerning for lymphoma. There is also a 6.3 cm mass in the right lobe of the liver. This was also felt to possibly be secondary to metastatic disease. She also underwent a PET scan and has had a biopsy of one of the lymph nodes She presents today with swelling of both forearms and both legs. I'm very concerned she is developing lymphatic obstruction. Her initial biopsies did not reveal any definite evidence of cancer however her clinical course, history, and examination are very concerning for a malignancy. I spoke with Dr. Lindi Adie. He advise  referral to [facility] to be admitted and he can serve as the Optometrist.I personally performed the services described in this documentation, which was scribed in my presence. The recorded information has been reviewed and is accurate. Her history and exam are very concerning that she may be getting lymphatic or venous obstruction of the superior vena cava and inferior vena cava. I spoke with triage and they will evaluate the patient in consideration for admission and evaluation. Her chest x-ray is also suspicious for right hilar enlargement.

## 2014-10-20 NOTE — ED Notes (Signed)
Bed: DX83 Expected date:  Expected time:  Means of arrival:  Comments: Trans from UC Swelling legs and arms

## 2014-10-20 NOTE — ED Notes (Signed)
Pt from UC c/o upper and lower bilateral extremity edema. Pt observed to have swelling with +1 edema in upper/lower bilateral extremities. Pt denies SOB, CP or pain. Pt is A&O x4 and in NAD

## 2014-10-20 NOTE — Progress Notes (Signed)
VASCULAR LAB PRELIMINARY  PRELIMINARY  PRELIMINARY  PRELIMINARY  Bilateral upper and lower extremity venous Dopplers completed.    Preliminary report:  There is no DVT or SVT noted in the bilateral upper or lower extremities.  Enlarged lymph nodes noted in the axilla and in the bilateral groins.  Ersilia Brawley, RVT 10/20/2014, 3:51 PM

## 2014-10-20 NOTE — H&P (Addendum)
[facility] History and Physical  Connie Mccann EQA:[id number] DOB: August 15, 1931 DOA: 10/20/2014  Referring physician:  PCP: Delman Cheadle, MD   Chief Complaint: Edema   HPI:  80 year old female with a history of hypertension, diabetes, enlarged lymph nodes in the past status post left inguinal biopsy which showed lymphoid hyperplasia without any malignancy and on 07/05/2014, presents with bilateral extremity swelling. Increased swelling in arms and legs worse over the last 3 days.Patient is followed by Dr. Alen Blew for oncology regarding lymphoma. Patient had a CT scan performed in 05/31/2014 where lymphadenopathy was identified rather extensive. Biopsies were unremarkable. Patient was seen and assessed at family medicine earlier today where she was recommended to come to the ED for admission as per on-call oncologist, Dr. Lindi Adie. CBC negative elevated white blood cell count. Hemoglobin 12.2, hematocrit 36.2. CMP unremarkable-glucose 101 with negative elevated anion gap-5.0 mEq per liter. BNP negative elevation. Urinalysis noted trace leukocyteswith white blood cell count of 0-2. Glucose 89. This provider spoke with on-call physician, Dr. Daymon Larsen case in great detail who recommended patient to be admitted to internal medicine and oncology to follow.She reports no fevers or chills or sweats. She reports her appetite have been within normal range without any weight loss or appetite changes. She does not report any headaches or blurry vision or syncope      Review of Systems: negative for the following  Constitutional: Denies fever, chills, diaphoresis, appetite change and fatigue.  HEENT: Denies photophobia, eye pain, redness, hearing loss, ear pain, congestion, sore throat, rhinorrhea, sneezing, mouth sores, trouble swallowing, neck pain, neck stiffness and tinnitus.  Respiratory: Denies SOB, DOE, cough, chest tightness, and wheezing.  Cardiovascular: Positive for leg swelling. Negative  for chest pain.  Gastrointestinal: Denies nausea, vomiting, abdominal pain, diarrhea, constipation, blood in stool and abdominal distention.  Genitourinary: Denies dysuria, urgency, frequency, hematuria, flank pain and difficulty urinating.  Musculoskeletal: Denies myalgias, back pain, joint swelling, arthralgias and gait problem.  Skin: Denies pallor, rash and wound.  Neurological: Denies dizziness, seizures, syncope, weakness, light-headedness, numbness and headaches.  Hematological: Denies adenopathy. Easy bruising, personal or family bleeding history  Psychiatric/Behavioral: Denies suicidal ideation, mood changes, confusion, nervousness, sleep disturbance and agitation       Past Medical History  Diagnosis Date  . Hypertension   . Asthma      History reviewed. No pertinent past surgical history.    Social History:  reports that she has never smoked. She does not have any smokeless tobacco history on file. She reports that she does not drink alcohol or use illicit drugs.    No Known Allergies  No family history on file.   Prior to Admission medications   Medication Sig Start Date End Date Taking? Authorizing Provider  losartan (COZAAR) 50 MG tablet Take 1 tablet (50 mg total) by mouth daily. Take as needed for blood pressure  >150. 11/16/13  Yes Shawnee Knapp, MD  Multiple Vitamin (MULTIVITAMIN) tablet Take 1 tablet by mouth daily.   Yes Historical Provider, MD  Multiple Vitamins-Minerals (OCUVITE EYE + MULTI) TABS Take 1 tablet by mouth daily.   Yes Historical Provider, MD  albuterol (PROAIR HFA) 108 (90 BASE) MCG/ACT inhaler Inhale 2 puffs into the lungs every 4 (four) hours as needed for wheezing or shortness of breath. Patient not taking: Reported on 10/20/2014 11/16/13   Shawnee Knapp, MD  beclomethasone (QVAR) 80 MCG/ACT inhaler Inhale 1 puff into the lungs 2 (two) times daily. Patient not taking: Reported on 10/20/2014 11/16/13 11/16/14  Shawnee Knapp, MD     Physical  Exam: Filed Vitals:   10/20/14 1418 10/20/14 1420 10/20/14 1422 10/20/14 1434  BP:    125/57  Pulse:    72  Temp:      Resp: 16 20 17 15   SpO2:    98%    Constitutional: She is oriented to person, place, and time. She appears well-developed and well-nourished. No distress.  HENT:  Head: Normocephalic and atraumatic.  Eyes: Conjunctivae and EOM are normal. Pupils are equal, round, and reactive to light. Right eye exhibits no discharge. Left eye exhibits no discharge.  Neck: Normal range of motion. Neck supple. No tracheal deviation present.  Cardiovascular: Normal rate, regular rhythm and normal heart sounds. Exam reveals no friction rub.  No murmur heard. 2+ pitting edema noted to the lower extremities bilaterally  Pulmonary/Chest: Effort normal and breath sounds normal. No respiratory distress. She has no wheezes. She has no rales.  Musculoskeletal: Normal range of motion.  Swelling identified to the upper extremities and lower extremities bilaterally. Negative erythema, warmth upon palpation, pain upon palpation. Negative abnormalities or rashes identified. Swelling identified to lower extremities bilaterally with 2+ pitting edema noted from the dorsal aspects of the feet is below the knees bilaterally. Negative ulcers noted. Negative signs of infection or cellulitis or joint swelling.  Lymphadenopathy:   She has no cervical adenopathy.  Neurological: She is alert and oriented to person, place, and time. No cranial nerve deficit. She exhibits normal muscle tone. Coordination normal.  Skin: Skin is warm and dry. No rash noted. She is not diaphoretic. No erythema.  Psychiatric: She has a normal mood and affect. Her behavior is normal. Thought content normal.  Nursing note and vitals reviewed.      Labs on Admission:    Basic Metabolic Panel:  Recent Labs Lab 10/20/14 1204  NA 135  K 4.1  CL 106  CO2 24  GLUCOSE 101*  BUN 11  CREATININE 0.77  CALCIUM 9.3  MG 1.6    Liver Function Tests:  Recent Labs Lab 10/20/14 1204  AST 22  ALT 12  ALKPHOS 55  BILITOT 0.8  PROT 8.0  ALBUMIN 3.3*   No results for input(s): LIPASE, AMYLASE in the last 168 hours. No results for input(s): AMMONIA in the last 168 hours. CBC:  Recent Labs Lab 10/20/14 0953 10/20/14 1204  WBC 6.4 6.1  NEUTROABS  --  1.9  HGB 12.1* 12.2  HCT 38.7 36.2  MCV 77.2* 75.4*  PLT  --  268   Cardiac Enzymes:  Recent Labs Lab 10/20/14 1204  TROPONINI <0.03    BNP (last 3 results) No results for input(s): PROBNP in the last 8760 hours.    CBG:  Recent Labs Lab 10/20/14 1147  GLUCAP 89    Radiological Exams on Admission: Dg Chest 2 View  10/20/2014   CLINICAL DATA:  Bilateral arm and leg swelling  EXAM: CHEST  2 VIEW  COMPARISON:  PET scan 06/14/2014  FINDINGS: Cardiomediastinal silhouette is stable. Mild right hilar prominence. Although may be vascular adenopathy cannot be excluded. No segmental infiltrate or pulmonary edema. Mild degenerative changes thoracic spine.  IMPRESSION: Mild right hilar prominence. Although may be vascular adenopathy cannot be excluded. No segmental infiltrate or pulmonary edema.   Electronically Signed   By: Lahoma Crocker M.D.   On: 10/20/2014 10:22    EKG: Independently reviewed.  Assessment/Plan Active Problems:   Lymphedema   Bilateral upper and lower extremity edema Given  history of extensive lymphadenopathy in the past will Doppler bilateral upper lower extremities Discussed with Dr. Lindi Adie  Patient will  be evaluated with CT chest abdomen pelvis We'll check uric acid, LDH Check a pro BNP 2-D echo Start the patient on Lasix IV today Strict I's and O   Hypertension Continue Cozaar    Code Status:   full Family Communication: bedside Disposition Plan: admit   Time spent: 70 mins   [facility] Pager [phone]  If 7PM-7AM, please contact night-coverage [email or web address] Password  [email or web address] 10/20/2014, 2:51 PM

## 2014-10-20 NOTE — ED Provider Notes (Signed)
CSN: [id number]     Arrival date & time 10/20/14  1105 History   First MD Initiated Contact with Patient 10/20/14 1126     Chief Complaint  Patient presents with  . Edema     (Consider location/radiation/quality/duration/timing/severity/associated sxs/prior Treatment) The history is provided by the patient. No language interpreter was used.  Connie Mccann is an 80 year old female with past medical history of hypertension, diabetes, lymphoma presenting to the ED with extremity swelling that started approximately 3 nights ago. Patient reported that she noticed swelling to her arms and legs bilaterally has progressively gotten worse over the past 3 days. Stated that she has a throbbing sensation in her legs bilaterally and states that they feel heavy when she lifts them to walk. Denied rash, itching, fever, chills, travel, fall, injury, chest pain, shortness of breath, difficulty breathing, abdominal pain, urinary symptoms, fever, chills. PCP Dr. Brigitte Pulse  Past Medical History  Diagnosis Date  . Hypertension   . Asthma    History reviewed. No pertinent past surgical history. No family history on file. History  Substance Use Topics  . Smoking status: Never Smoker   . Smokeless tobacco: Not on file  . Alcohol Use: No   OB History    No data available     Review of Systems  Constitutional: Negative for fever and chills.  Respiratory: Negative for cough, chest tightness and shortness of breath.   Cardiovascular: Positive for leg swelling. Negative for chest pain.  Neurological: Negative for dizziness, weakness, numbness and headaches.      Allergies  Review of patient's allergies indicates no known allergies.  Home Medications   Prior to Admission medications   Medication Sig Start Date End Date Taking? Authorizing Provider  losartan (COZAAR) 50 MG tablet Take 1 tablet (50 mg total) by mouth daily. Take as needed for blood pressure  >150. 11/16/13  Yes Shawnee Knapp, MD  Multiple  Vitamin (MULTIVITAMIN) tablet Take 1 tablet by mouth daily.   Yes Historical Provider, MD  Multiple Vitamins-Minerals (OCUVITE EYE + MULTI) TABS Take 1 tablet by mouth daily.   Yes Historical Provider, MD  albuterol (PROAIR HFA) 108 (90 BASE) MCG/ACT inhaler Inhale 2 puffs into the lungs every 4 (four) hours as needed for wheezing or shortness of breath. Patient not taking: Reported on 10/20/2014 11/16/13   Shawnee Knapp, MD  beclomethasone (QVAR) 80 MCG/ACT inhaler Inhale 1 puff into the lungs 2 (two) times daily. Patient not taking: Reported on 10/20/2014 11/16/13 11/16/14  Shawnee Knapp, MD   BP 145/63 mmHg  Pulse 79  Temp(Src) 97.6 F (36.4 C) (Oral)  Resp 18  Ht 5\' 4"  (1.626 m)  Wt 155 lb 6.8 oz (70.5 kg)  BMI 26.67 kg/m2  SpO2 100% Physical Exam  Constitutional: She is oriented to person, place, and time. She appears well-developed and well-nourished. No distress.  HENT:  Head: Normocephalic and atraumatic.  Eyes: Conjunctivae and EOM are normal. Pupils are equal, round, and reactive to light. Right eye exhibits no discharge. Left eye exhibits no discharge.  Neck: Normal range of motion. Neck supple. No tracheal deviation present.  Cardiovascular: Normal rate, regular rhythm and normal heart sounds.  Exam reveals no friction rub.   No murmur heard. 2+ pitting edema noted to the lower extremities bilaterally   Pulmonary/Chest: Effort normal and breath sounds normal. No respiratory distress. She has no wheezes. She has no rales.  Musculoskeletal: Normal range of motion.  Swelling identified to the upper extremities and  lower extremities bilaterally. Negative erythema, warmth upon palpation, pain upon palpation. Negative abnormalities or rashes identified. Swelling identified to lower extremities bilaterally with 2+ pitting edema noted from the dorsal aspects of the feet is below the knees bilaterally. Negative ulcers noted. Negative signs of infection or cellulitis or joint swelling.   Lymphadenopathy:    She has no cervical adenopathy.  Neurological: She is alert and oriented to person, place, and time. No cranial nerve deficit. She exhibits normal muscle tone. Coordination normal.  Skin: Skin is warm and dry. No rash noted. She is not diaphoretic. No erythema.  Psychiatric: She has a normal mood and affect. Her behavior is normal. Thought content normal.  Nursing note and vitals reviewed.   ED Course  Procedures (including critical care time)  Results for orders placed or performed during the hospital encounter of 10/20/14  CBC with Differential  Result Value Ref Range   WBC 6.1 4.0 - 10.5 K/uL   RBC 4.80 3.87 - 5.11 MIL/uL   Hemoglobin 12.2 12.0 - 15.0 g/dL   HCT 36.2 36.0 - 46.0 %   MCV 75.4 (L) 78.0 - 100.0 fL   MCH 25.4 (L) 26.0 - 34.0 pg   MCHC 33.7 30.0 - 36.0 g/dL   RDW 16.4 (H) 11.5 - 15.5 %   Platelets 268 150 - 400 K/uL   Neutrophils Relative % 32 (L) 43 - 77 %   Neutro Abs 1.9 1.7 - 7.7 K/uL   Lymphocytes Relative 34 12 - 46 %   Lymphs Abs 2.1 0.7 - 4.0 K/uL   Monocytes Relative 9 3 - 12 %   Monocytes Absolute 0.5 0.1 - 1.0 K/uL   Eosinophils Relative 25 (H) 0 - 5 %   Eosinophils Absolute 1.5 (H) 0.0 - 0.7 K/uL   Basophils Relative 1 0 - 1 %   Basophils Absolute 0.1 0.0 - 0.1 K/uL  Comprehensive metabolic panel  Result Value Ref Range   Sodium 135 135 - 145 mmol/L   Potassium 4.1 3.5 - 5.1 mmol/L   Chloride 106 96 - 112 mEq/L   CO2 24 19 - 32 mmol/L   Glucose, Bld 101 (H) 70 - 99 mg/dL   BUN 11 6 - 23 mg/dL   Creatinine, Ser 0.77 0.50 - 1.10 mg/dL   Calcium 9.3 8.4 - 10.5 mg/dL   Total Protein 8.0 6.0 - 8.3 g/dL   Albumin 3.3 (L) 3.5 - 5.2 g/dL   AST 22 0 - 37 U/L   ALT 12 0 - 35 U/L   Alkaline Phosphatase 55 39 - 117 U/L   Total Bilirubin 0.8 0.3 - 1.2 mg/dL   GFR calc non Af Amer 77 (L) >90 mL/min   GFR calc Af Amer 89 (L) >90 mL/min   Anion gap 5 5 - 15  Brain natriuretic peptide  Result Value Ref Range   B Natriuretic Peptide  79.5 0.0 - 100.0 pg/mL  Urinalysis, Routine w reflex microscopic  Result Value Ref Range   Color, Urine YELLOW YELLOW   APPearance CLEAR CLEAR   Specific Gravity, Urine 1.011 1.005 - 1.030   pH 6.0 5.0 - 8.0   Glucose, UA NEGATIVE NEGATIVE mg/dL   Hgb urine dipstick NEGATIVE NEGATIVE   Bilirubin Urine NEGATIVE NEGATIVE   Ketones, ur NEGATIVE NEGATIVE mg/dL   Protein, ur NEGATIVE NEGATIVE mg/dL   Urobilinogen, UA 0.2 0.0 - 1.0 mg/dL   Nitrite NEGATIVE NEGATIVE   Leukocytes, UA TRACE (A) NEGATIVE  Urine microscopic-add on  Result  Value Ref Range   WBC, UA 0-2 <3 WBC/hpf  Magnesium  Result Value Ref Range   Magnesium 1.6 1.5 - 2.5 mg/dL  Troponin I  Result Value Ref Range   Troponin I <0.03 <0.031 ng/mL  Lactate dehydrogenase  Result Value Ref Range   LDH 275 (H) 94 - 250 U/L  Uric acid  Result Value Ref Range   Uric Acid, Serum 6.0 2.4 - 7.0 mg/dL  CBG monitoring, ED  Result Value Ref Range   Glucose-Capillary 89 70 - 99 mg/dL    Labs Review Labs Reviewed  CBC WITH DIFFERENTIAL - Abnormal; Notable for the following:    MCV 75.4 (*)    MCH 25.4 (*)    RDW 16.4 (*)    Neutrophils Relative % 32 (*)    Eosinophils Relative 25 (*)    Eosinophils Absolute 1.5 (*)    All other components within normal limits  COMPREHENSIVE METABOLIC PANEL - Abnormal; Notable for the following:    Glucose, Bld 101 (*)    Albumin 3.3 (*)    GFR calc non Af Amer 77 (*)    GFR calc Af Amer 89 (*)    All other components within normal limits  URINALYSIS, ROUTINE W REFLEX MICROSCOPIC - Abnormal; Notable for the following:    Leukocytes, UA TRACE (*)    All other components within normal limits  LACTATE DEHYDROGENASE - Abnormal; Notable for the following:    LDH 275 (*)    All other components within normal limits  BRAIN NATRIURETIC PEPTIDE  URINE MICROSCOPIC-ADD ON  MAGNESIUM  TROPONIN I  URIC ACID  TSH  TROPONIN I  TROPONIN I  HEMOGLOBIN A1C  BRAIN NATRIURETIC PEPTIDE  CBG  MONITORING, ED    Imaging Review Dg Chest 2 View  10/20/2014   CLINICAL DATA:  Edema and rash for a few days, hypertension, asthma  EXAM: CHEST  2 VIEW  COMPARISON:  10/20/2014 exam from [facility] at 0858 hr  FINDINGS: Upper normal heart size.  Atherosclerotic calcification aorta.  Prominence of RIGHT hilum again noted, similar to an earlier study from 07/01/2012.  Bronchitic changes without infiltrate, pleural effusion or pneumothorax.  Bones demineralized.  IMPRESSION: Persistent bronchitic changes and minimal chronic hilar prominence without acute infiltrate.   Electronically Signed   By: Lavonia Dana M.D.   On: 10/20/2014 15:28   Dg Chest 2 View  10/20/2014   CLINICAL DATA:  Bilateral arm and leg swelling  EXAM: CHEST  2 VIEW  COMPARISON:  PET scan 06/14/2014  FINDINGS: Cardiomediastinal silhouette is stable. Mild right hilar prominence. Although may be vascular adenopathy cannot be excluded. No segmental infiltrate or pulmonary edema. Mild degenerative changes thoracic spine.  IMPRESSION: Mild right hilar prominence. Although may be vascular adenopathy cannot be excluded. No segmental infiltrate or pulmonary edema.   Electronically Signed   By: Lahoma Crocker M.D.   On: 10/20/2014 10:22     EKG Interpretation   Date/Time:  Saturday October 20 2014 14:18:41 EST Ventricular Rate:  68 PR Interval:  140 QRS Duration: 100 QT Interval:  415 QTC Calculation: 441 R Axis:   -31 Text Interpretation:  Sinus rhythm Low voltage, precordial leads Left  ventricular hypertrophy No significant change was found Confirmed by  CAMPOS  MD, Lennette Bihari ([id number]) on 10/20/2014 2:21:13 PM      1:08 PM This provider spoke with Dr. Lindi Adie, on-call for Oncology. Discussed case in great detail. Physician reported that he was aware of the  patient secondary to being consulted by urgent care center. Reported that patient will need to be admitted to the hospital under the care of internal medicine.  Reported that oncology will follow with the patient.   1:34 PM This provider spoke with Dr. Allyson Sabal - discussed case in great detail. Patient to be admitted as per request from Oncology.   MDM   Final diagnoses:  Edema    Medications  enoxaparin (LOVENOX) injection 40 mg (not administered)  sodium chloride 0.9 % injection 3 mL (3 mLs Intravenous Not Given 10/20/14 1345)  sodium chloride 0.9 % injection 3 mL (3 mLs Intravenous Not Given 10/20/14 1345)  sodium chloride 0.9 % injection 3 mL (not administered)  0.9 %  sodium chloride infusion (not administered)  acetaminophen (TYLENOL) tablet 650 mg (not administered)    Or  acetaminophen (TYLENOL) suppository 650 mg (not administered)  HYDROmorphone (DILAUDID) injection 0.5 mg (not administered)  ondansetron (ZOFRAN) tablet 4 mg (not administered)    Or  ondansetron (ZOFRAN) injection 4 mg (not administered)  levalbuterol (XOPENEX) nebulizer solution 0.63 mg (not administered)  furosemide (LASIX) injection 40 mg (40 mg Intravenous Given 10/20/14 1615)    Filed Vitals:   10/20/14 1420 10/20/14 1422 10/20/14 1434 10/20/14 1453  BP:    145/63  Pulse:   72 79  Temp:    97.6 F (36.4 C)  TempSrc:    Oral  Resp: 20 17 15 18   Height:    5\' 4"  (1.626 m)  Weight:    155 lb 6.8 oz (70.5 kg)  SpO2:   98% 100%   This provider reviewed the patient's chart. Patient is followed by Dr. Alen Blew for oncology regarding lymphoma. Patient had a CT scan performed in 05/31/2014 where lymphadenopathy was identified rather extensive. Biopsies were unremarkable. Patient was seen and assessed at family medicine earlier today where she was recommended to come to the ED for admission as per on-call oncologist, Dr. Lindi Adie. EKG noted normal sinus rhythm with a heart rate of 68 bpm. CBC negative elevated white blood cell count. Hemoglobin 12.2, hematocrit 36.2. CMP unremarkable-glucose 101 with negative elevated anion gap-5.0 mEq per liter. BNP negative  elevation. Urinalysis noted trace leukocyteswith white blood cell count of 0-2. Glucose 89. Chest xray noted persistent bronchitic changes and minimal chronic hilar prominence without acute infiltrate.  This provider spoke with on-call physician, Dr. Daymon Larsen case in great detail who recommended patient to be admitted to internal medicine and oncology to follow. Dopplers of upper and lower extremities bilaterally ordered. Patient to be admitted for possibilty of lymphatic obstruction. Patient admitted under the care of [facility]. Patient stable for transfer.    Jamse Mead, PA-C 10/20/14 1638  Artis Delay, MD 10/20/14 [phone]

## 2014-10-20 NOTE — Progress Notes (Signed)
  Echocardiogram 2D Echocardiogram has been performed.  Connie Mccann 10/20/2014, 4:12 PM

## 2014-10-21 DIAGNOSIS — R591 Generalized enlarged lymph nodes: Principal | ICD-10-CM

## 2014-10-21 DIAGNOSIS — R601 Generalized edema: Secondary | ICD-10-CM

## 2014-10-21 DIAGNOSIS — I5033 Acute on chronic diastolic (congestive) heart failure: Secondary | ICD-10-CM

## 2014-10-21 LAB — COMPREHENSIVE METABOLIC PANEL
ALBUMIN: 2.8 g/dL — AB (ref 3.5–5.2)
ALT: 10 U/L (ref 0–35)
AST: 17 U/L (ref 0–37)
Alkaline Phosphatase: 49 U/L (ref 39–117)
Anion gap: 4 — ABNORMAL LOW (ref 5–15)
BUN: 13 mg/dL (ref 6–23)
CHLORIDE: 106 meq/L (ref 96–112)
CO2: 27 mmol/L (ref 19–32)
Calcium: 8.9 mg/dL (ref 8.4–10.5)
Creatinine, Ser: 0.85 mg/dL (ref 0.50–1.10)
GFR calc Af Amer: 72 mL/min — ABNORMAL LOW (ref >90)
GFR calc non Af Amer: 63 mL/min — ABNORMAL LOW (ref >90)
Glucose, Bld: 97 mg/dL (ref 70–99)
Potassium: 3.9 mmol/L (ref 3.5–5.1)
SODIUM: 137 mmol/L (ref 135–145)
Total Bilirubin: 0.4 mg/dL (ref 0.3–1.2)
Total Protein: 6.8 g/dL (ref 6.0–8.3)

## 2014-10-21 LAB — CBC
HEMATOCRIT: 33.6 % — AB (ref 36.0–46.0)
Hemoglobin: 11.1 g/dL — ABNORMAL LOW (ref 12.0–15.0)
MCH: 24.7 pg — AB (ref 26.0–34.0)
MCHC: 33 g/dL (ref 30.0–36.0)
MCV: 74.8 fL — AB (ref 78.0–100.0)
PLATELETS: 253 10*3/uL (ref 150–400)
RBC: 4.49 MIL/uL (ref 3.87–5.11)
RDW: 16.3 % — AB (ref 11.5–15.5)
WBC: 6.4 10*3/uL (ref 4.0–10.5)

## 2014-10-21 LAB — GLUCOSE, CAPILLARY: Glucose-Capillary: 84 mg/dL (ref 70–99)

## 2014-10-21 LAB — TROPONIN I: Troponin I: <0.03 ng/mL (ref <0.031)

## 2014-10-21 LAB — HEMOGLOBIN A1C
HEMOGLOBIN A1C: 5.4 % (ref <5.7)
Mean Plasma Glucose: 108 mg/dL (ref <117)

## 2014-10-21 MED ORDER — FUROSEMIDE 20 MG PO TABS: 20.0000 mg | ORAL_TABLET | Freq: Every day | ORAL | Status: DC

## 2014-10-21 MED ORDER — FUROSEMIDE 20 MG PO TABS: 40.0000 mg | ORAL_TABLET | Freq: Every day | ORAL | Status: DC

## 2014-10-21 NOTE — Consult Note (Signed)
[place] NOTE  Patient Care Team: Shawnee Knapp, MD as PCP - General (Family Medicine)  CHIEF COMPLAINTS/PURPOSE OF CONSULTATION:  Anasarca, generalized lymphadenopathy  HISTORY OF PRESENTING ILLNESS:  Connie Mccann 80 y.o. female is here because of diffuse swelling of her hands and legs and abdomen. She reports that these symptoms have been going on for the past 2 weeks. Previously she had been seen by Dr. Osker Mason for generalized lymphadenopathy and she underwent a PET/CT scan that showed abnormalities not only the lymph nodes but also in the liver. She underwent a right inguinal lymph node biopsy which did not reveal any malignancy. It showed reactive lymphoid hyperplasia. She presented back to the urgent care center with complaints of diffuse swelling of her arms and legs as well as a rash. She had a CT of her chest abdomen pelvis which revealed waxing and waning lymphadenopathy. It appears that several of the abdominal lymph nodes and the liver are smaller but the bilateral axillary lymph nodes are larger. She was prescribed intravenous Lasix. This certainly had helped to decrease the swelling of her hands and feet.   MEDICAL HISTORY:  Past Medical History  Diagnosis Date  . Hypertension   . Asthma     SURGICAL HISTORY: History reviewed. No pertinent past surgical history.  SOCIAL HISTORY: History   Social History  . Marital Status: Married    Spouse Name: N/A    Number of Children: N/A  . Years of Education: N/A   Occupational History  . Not on file.   Social History Main Topics  . Smoking status: Never Smoker   . Smokeless tobacco: Not on file  . Alcohol Use: No  . Drug Use: No  . Sexual Activity: Not on file   Other Topics Concern  . Not on file   Social History Narrative    FAMILY HISTORY: No family history on file.  ALLERGIES:  has No Known Allergies.  MEDICATIONS:  Current Facility-Administered Medications  Medication Dose Route  Frequency Provider Last Rate Last Dose  . 0.9 %  sodium chloride infusion  250 mL Intravenous PRN Reyne Dumas, MD      . acetaminophen (TYLENOL) tablet 650 mg  650 mg Oral Q6H PRN Reyne Dumas, MD       Or  . acetaminophen (TYLENOL) suppository 650 mg  650 mg Rectal Q6H PRN Reyne Dumas, MD      . enoxaparin (LOVENOX) injection 40 mg  40 mg Subcutaneous Q24H Reyne Dumas, MD   40 mg at 10/20/14 2228  . HYDROmorphone (DILAUDID) injection 0.5 mg  0.5 mg Intravenous Q4H PRN Reyne Dumas, MD      . levalbuterol (XOPENEX) nebulizer solution 0.63 mg  0.63 mg Nebulization Q6H PRN Reyne Dumas, MD      . ondansetron (ZOFRAN) tablet 4 mg  4 mg Oral Q6H PRN Reyne Dumas, MD       Or  . ondansetron (ZOFRAN) injection 4 mg  4 mg Intravenous Q6H PRN Reyne Dumas, MD      . sodium chloride 0.9 % injection 3 mL  3 mL Intravenous Q12H Reyne Dumas, MD   3 mL at 10/20/14 1345  . sodium chloride 0.9 % injection 3 mL  3 mL Intravenous Q12H Reyne Dumas, MD   3 mL at 10/20/14 2229  . sodium chloride 0.9 % injection 3 mL  3 mL Intravenous PRN Reyne Dumas, MD        REVIEW OF SYSTEMS:   Constitutional:  Denies fevers, chills or abnormal night sweats Eyes: Denies blurriness of vision, double vision or watery eyes Ears, nose, mouth, throat, and face: Denies mucositis or sore throat Respiratory: Denies cough, dyspnea or wheezes Cardiovascular: Denies palpitation, chest discomfort Gastrointestinal:  Denies nausea, heartburn or change in bowel habits Skin: No skin rash that she had previously had completely resolved  Lymphatics: Patient can feel lymph nodes under the left arm Neurological:Denies numbness, tingling or new weaknesses Behavioral/Psych: Mood is stable, no new changes  All other systems were reviewed with the patient and are negative.  PHYSICAL EXAMINATION: ECOG PERFORMANCE STATUS: 1 - Symptomatic but completely ambulatory  Filed Vitals:   10/21/14 0459  BP: 129/54  Pulse: 75  Temp: 98.1 F  (36.7 C)  Resp: 16   Filed Weights   10/20/14 1453  Weight: 155 lb 6.8 oz (70.5 kg)    GENERAL:alert, no distress and comfortable SKIN: skin color, texture, turgor are normal, no rashes or significant lesions EYES: normal, conjunctiva are pink and non-injected, sclera clear OROPHARYNX:no exudate, no erythema and lips, buccal mucosa, and tongue normal  NECK: supple, thyroid normal size, non-tender, without nodularity LYMPH:  Palpable left axillary lymph node LUNGS: clear to auscultation HEART: regular rate & rhythm and no murmurs ABDOMEN:abdomen soft, non-tender and normal bowel sounds Musculoskeletal:no cyanosis of digits and no clubbing  EXT: Arms and legs appeared to be swollen nonpitting type of edema with tense skin thickening noted on her legs. PSYCH: alert & oriented x 3 with fluent speech NEURO: no focal motor/sensory deficits  LABORATORY DATA:  I have reviewed the data as listed Lab Results  Component Value Date   WBC 6.4 10/21/2014   HGB 11.1* 10/21/2014   HCT 33.6* 10/21/2014   MCV 74.8* 10/21/2014   PLT 253 10/21/2014   Lab Results  Component Value Date   NA 137 10/21/2014   K 3.9 10/21/2014   CL 106 10/21/2014   CO2 27 10/21/2014    RADIOGRAPHIC STUDIES: I have personally reviewed the radiological reports and agreed with the findings in the report. CT chest abdomen pelvis reveals lymphadenopathy in the abdomen and liver nodule appeared to be smaller but the bilateral axillary lymph nodes appear to be larger  ASSESSMENT AND PLAN:  1. Waxing and waning generalized lymphadenopathy: Unclear etiology. Since he now has enlarged axillary lymph nodes, she could benefit from an excisional lymph node biopsy of the axilla. The alternative would be to biopsy the liver. Without any definitive diagnosis, we are unable to discuss any treatment plan.  2. Anasarca: With normal kidney, liver, cardiac function risk certainly raise the possibility of underlying lymphoid  malignancy given the generalized lymphadenopathy. We would need an excisional biopsy of a lymph node to prove or disprove this.. Previous biopsy of the inguinal lymph node was negative for malignancy or lymphoma.  Dr. Osker Mason will take over her oncology care from tomorrow    Rulon Eisenmenger, MD @T @ 10:12 AM

## 2014-10-21 NOTE — Progress Notes (Signed)
Nutrition Brief Note  RD consulted for diet education.  Per RN, pt with low albumin level. Patient following a vegetarian-like diet at home. Spoke with patient and patient's daughter on the phone. Pt reports UBW of 150 lb, no recent weight loss. Pt normally eats 2 meals a day, consisting mainly of grains and vegetables. Encouraged pt to consume at least a snack with protein during skipped mealtimes. Provided examples of good protein snacks.  Per patient, she will eat a meat like chicken or fish once a week. Provided examples of plant-based proteins she can incorporate in her diet. Pt has no aversion to dairy and RD explained to patient that these foods contain good amounts of protein. Encouraged pt to try nutritional supplements for days when she is not eating well.  Wt Readings from Last 15 Encounters:  10/20/14 155 lb 6.8 oz (70.5 kg)  10/20/14 159 lb 3.2 oz (72.213 kg)  07/12/14 146 lb 12.8 oz (66.588 kg)  06/21/14 147 lb (66.679 kg)  06/07/14 150 lb 12.8 oz (68.402 kg)  05/31/14 153 lb (69.4 kg)  01/18/14 151 lb (68.493 kg)  01/16/14 152 lb (68.947 kg)  11/16/13 151 lb (68.493 kg)  08/04/13 152 lb (68.947 kg)  06/23/13 152 lb (68.947 kg)  12/23/12 152 lb 9.6 oz (69.219 kg)  12/02/12 156 lb (70.761 kg)  09/02/12 160 lb (72.576 kg)  08/21/12 160 lb (72.576 kg)    Body mass index is 26.67 kg/(m^2). Patient meets criteria for overweight based on current BMI.   Current diet order is Heart Healthy, patient is consuming approximately 100% of meals at this time. Labs and medications reviewed.   If further nutrition issues arise, please re-consult RD.   Clayton Bibles, MS, RD, LDN Pager: [phone] After Hours Pager: [phone]

## 2014-10-21 NOTE — Discharge Summary (Addendum)
Physician Discharge Summary  Connie Mccann MRN: [id number] DOB/AGE: 1930-12-12 80 y.o.  PCP: Delman Cheadle, MD   Admit date: 10/20/2014 Discharge date: 10/21/2014  Discharge Diagnoses:      Waxing and waning generalized lymphadenopathy Hypoalbuminemia Enlarged right axillary lymph node Anasarca Acute on chronic diastolic heart failure  Follow-up recommendations Follow-up with PCP in 5-7 days Follow-up BMP, CBC in 3-4 days         Medication List    TAKE these medications        albuterol 108 (90 BASE) MCG/ACT inhaler  Commonly known as:  PROAIR HFA  Inhale 2 puffs into the lungs every 4 (four) hours as needed for wheezing or shortness of breath.     beclomethasone 80 MCG/ACT inhaler  Commonly known as:  QVAR  Inhale 1 puff into the lungs 2 (two) times daily.     furosemide 20 MG tablet  Commonly known as:  LASIX  Take 2 tablets (40 mg total) by mouth daily.     losartan 50 MG tablet  Commonly known as:  COZAAR  Take 1 tablet (50 mg total) by mouth daily. Take as needed for blood pressure  >150.     multivitamin tablet  Take 1 tablet by mouth daily.     OCUVITE EYE + MULTI Tabs  Take 1 tablet by mouth daily.        Discharge Condition: Stable Disposition: Final discharge disposition not confirmed   Consults: Oncology  Significant Diagnostic Studies: Dg Chest 2 View  10/20/2014   CLINICAL DATA:  Edema and rash for a few days, hypertension, asthma  EXAM: CHEST  2 VIEW  COMPARISON:  10/20/2014 exam from [facility] at 0858 hr  FINDINGS: Upper normal heart size.  Atherosclerotic calcification aorta.  Prominence of RIGHT hilum again noted, similar to an earlier study from 07/01/2012.  Bronchitic changes without infiltrate, pleural effusion or pneumothorax.  Bones demineralized.  IMPRESSION: Persistent bronchitic changes and minimal chronic hilar prominence without acute infiltrate.   Electronically Signed   By: Lavonia Dana M.D.   On:  10/20/2014 15:28   Dg Chest 2 View  10/20/2014   CLINICAL DATA:  Bilateral arm and leg swelling  EXAM: CHEST  2 VIEW  COMPARISON:  PET scan 06/14/2014  FINDINGS: Cardiomediastinal silhouette is stable. Mild right hilar prominence. Although may be vascular adenopathy cannot be excluded. No segmental infiltrate or pulmonary edema. Mild degenerative changes thoracic spine.  IMPRESSION: Mild right hilar prominence. Although may be vascular adenopathy cannot be excluded. No segmental infiltrate or pulmonary edema.   Electronically Signed   By: Lahoma Crocker M.D.   On: 10/20/2014 10:22   Ct Chest W Contrast  10/20/2014   CLINICAL DATA:  Progressive bilateral arm swelling, shortness of breath, abdominal pain. History of lymphoma.  EXAM: CT CHEST, ABDOMEN, AND PELVIS WITH CONTRAST  TECHNIQUE: Multidetector CT imaging of the chest, abdomen and pelvis was performed following the standard protocol during bolus administration of intravenous contrast.  CONTRAST:  184m OMNIPAQUE IOHEXOL 300 MG/ML  SOLN  COMPARISON:  CT abdomen and pelvis 05/31/2014.  PET CT 06/14/2014  FINDINGS: CT CHEST FINDINGS  There is bilateral axillary adenopathy. Right subpectoral lymph node has enlarged since prior PET CT, measuring 14 mm on image 11 of today's study compared with 10 mm previously. Right axillary lymph node has enlarged despite maintaining central fatty hilum. Short axis diameter measures approximately 21 mm compared with 13 mm previously. Inferior right axillary lymph node  on image 21 has a short axis diameter of 15 mm compared with 10 mm previously. Left axillary adenopathy is stable. Index left axillary lymph node on image 22 has a short axis diameter of 7 mm, stable.  Right cardiophrenic angle lymph node has a short axis diameter of 6 mm, stable. No other mediastinal adenopathy. No hilar adenopathy. Heart is upper limits normal in size. Aorta is normal caliber. Lungs are clear. No focal airspace opacities or suspicious nodules.  No effusions.  Chest wall soft tissues are unremarkable. No acute bony abnormality.  CT ABDOMEN AND PELVIS FINDINGS  Peripherally enhancing mass within the posterior right hepatic lobe has decreased in size measuring 5.1 cm in maximum dimension compared to 6.3 cm on prior CT abdomen. Small cyst centrally near the IVC, stable. No new hepatic lesions. Spleen, pancreas, adrenals and kidneys are unremarkable. Small cyst in the midpole of the left kidney. No hydronephrosis.  Lymph node in the right abdomen adjacent to small bowel loops measures 2.7 cm, stable since prior study. Retroperitoneal adenopathy has improved. Index left periaortic lymph node has a short axis diameter of 9 mm on image 68 compared with 20 mm previously. Other retroperitoneal lymph nodes have also decreased. Index left pelvic sidewall lymph node has a short axis diameter of 10 mm compared with 21 mm previously. Left inguinal adenopathy also improved. Index lymph node on image 110 measures 9 mm compared with 19 mm previously.  Diffuse colonic diverticulosis. No active diverticulitis. Stomach and small bowel are unremarkable. Aorta is normal caliber.  Prior hysterectomy. No adnexal masses. Urinary bladder is unremarkable.  No acute bony abnormality or focal bone lesion. Degenerative disc disease throughout the thoracolumbar spine.  IMPRESSION: Mixed response to treatment with improving adenopathy in the abdomen and pelvis, but enlarging right axillary lymph nodes since prior PET CT.  Decreasing size of the posterior right hepatic lesion.  No new adenopathy.  Diffuse colonic diverticulosis.   Electronically Signed   By: Rolm Baptise M.D.   On: 10/20/2014 18:16   Ct Abdomen Pelvis W Contrast  10/20/2014   CLINICAL DATA:  Progressive bilateral arm swelling, shortness of breath, abdominal pain. History of lymphoma.  EXAM: CT CHEST, ABDOMEN, AND PELVIS WITH CONTRAST  TECHNIQUE: Multidetector CT imaging of the chest, abdomen and pelvis was performed  following the standard protocol during bolus administration of intravenous contrast.  CONTRAST:  162m OMNIPAQUE IOHEXOL 300 MG/ML  SOLN  COMPARISON:  CT abdomen and pelvis 05/31/2014.  PET CT 06/14/2014  FINDINGS: CT CHEST FINDINGS  There is bilateral axillary adenopathy. Right subpectoral lymph node has enlarged since prior PET CT, measuring 14 mm on image 11 of today's study compared with 10 mm previously. Right axillary lymph node has enlarged despite maintaining central fatty hilum. Short axis diameter measures approximately 21 mm compared with 13 mm previously. Inferior right axillary lymph node on image 21 has a short axis diameter of 15 mm compared with 10 mm previously. Left axillary adenopathy is stable. Index left axillary lymph node on image 22 has a short axis diameter of 7 mm, stable.  Right cardiophrenic angle lymph node has a short axis diameter of 6 mm, stable. No other mediastinal adenopathy. No hilar adenopathy. Heart is upper limits normal in size. Aorta is normal caliber. Lungs are clear. No focal airspace opacities or suspicious nodules. No effusions.  Chest wall soft tissues are unremarkable. No acute bony abnormality.  CT ABDOMEN AND PELVIS FINDINGS  Peripherally enhancing mass within the posterior right hepatic  lobe has decreased in size measuring 5.1 cm in maximum dimension compared to 6.3 cm on prior CT abdomen. Small cyst centrally near the IVC, stable. No new hepatic lesions. Spleen, pancreas, adrenals and kidneys are unremarkable. Small cyst in the midpole of the left kidney. No hydronephrosis.  Lymph node in the right abdomen adjacent to small bowel loops measures 2.7 cm, stable since prior study. Retroperitoneal adenopathy has improved. Index left periaortic lymph node has a short axis diameter of 9 mm on image 68 compared with 20 mm previously. Other retroperitoneal lymph nodes have also decreased. Index left pelvic sidewall lymph node has a short axis diameter of 10 mm compared with  21 mm previously. Left inguinal adenopathy also improved. Index lymph node on image 110 measures 9 mm compared with 19 mm previously.  Diffuse colonic diverticulosis. No active diverticulitis. Stomach and small bowel are unremarkable. Aorta is normal caliber.  Prior hysterectomy. No adnexal masses. Urinary bladder is unremarkable.  No acute bony abnormality or focal bone lesion. Degenerative disc disease throughout the thoracolumbar spine.  IMPRESSION: Mixed response to treatment with improving adenopathy in the abdomen and pelvis, but enlarging right axillary lymph nodes since prior PET CT.  Decreasing size of the posterior right hepatic lesion.  No new adenopathy.  Diffuse colonic diverticulosis.   Electronically Signed   By: Rolm Baptise M.D.   On: 10/20/2014 18:16       Microbiology: No results found for this or any previous visit (from the past 240 hour(s)).   Labs: Results for orders placed or performed during the hospital encounter of 10/20/14 (from the past 48 hour(s))  CBG monitoring, ED     Status: None   Collection Time: 10/20/14 11:47 AM  Result Value Ref Range   Glucose-Capillary 89 70 - 99 mg/dL  Urinalysis, Routine w reflex microscopic     Status: Abnormal   Collection Time: 10/20/14 11:48 AM  Result Value Ref Range   Color, Urine YELLOW YELLOW   APPearance CLEAR CLEAR   Specific Gravity, Urine 1.011 1.005 - 1.030   pH 6.0 5.0 - 8.0   Glucose, UA NEGATIVE NEGATIVE mg/dL   Hgb urine dipstick NEGATIVE NEGATIVE   Bilirubin Urine NEGATIVE NEGATIVE   Ketones, ur NEGATIVE NEGATIVE mg/dL   Protein, ur NEGATIVE NEGATIVE mg/dL   Urobilinogen, UA 0.2 0.0 - 1.0 mg/dL   Nitrite NEGATIVE NEGATIVE   Leukocytes, UA TRACE (A) NEGATIVE  Urine microscopic-add on     Status: None   Collection Time: 10/20/14 11:48 AM  Result Value Ref Range   WBC, UA 0-2 <3 WBC/hpf  Lactate dehydrogenase     Status: Abnormal   Collection Time: 10/20/14 12:03 PM  Result Value Ref Range   LDH 275 (H) 94  - 250 U/L  Uric acid     Status: None   Collection Time: 10/20/14 12:03 PM  Result Value Ref Range   Uric Acid, Serum 6.0 2.4 - 7.0 mg/dL  CBC with Differential     Status: Abnormal   Collection Time: 10/20/14 12:04 PM  Result Value Ref Range   WBC 6.1 4.0 - 10.5 K/uL   RBC 4.80 3.87 - 5.11 MIL/uL   Hemoglobin 12.2 12.0 - 15.0 g/dL   HCT 36.2 36.0 - 46.0 %   MCV 75.4 (L) 78.0 - 100.0 fL   MCH 25.4 (L) 26.0 - 34.0 pg   MCHC 33.7 30.0 - 36.0 g/dL   RDW 16.4 (H) 11.5 - 15.5 %   Platelets 268 150 -  400 K/uL   Neutrophils Relative % 32 (L) 43 - 77 %   Neutro Abs 1.9 1.7 - 7.7 K/uL   Lymphocytes Relative 34 12 - 46 %   Lymphs Abs 2.1 0.7 - 4.0 K/uL   Monocytes Relative 9 3 - 12 %   Monocytes Absolute 0.5 0.1 - 1.0 K/uL   Eosinophils Relative 25 (H) 0 - 5 %   Eosinophils Absolute 1.5 (H) 0.0 - 0.7 K/uL   Basophils Relative 1 0 - 1 %   Basophils Absolute 0.1 0.0 - 0.1 K/uL  Comprehensive metabolic panel     Status: Abnormal   Collection Time: 10/20/14 12:04 PM  Result Value Ref Range   Sodium 135 135 - 145 mmol/L    Comment: Please note change in reference range.   Potassium 4.1 3.5 - 5.1 mmol/L    Comment: Please note change in reference range.   Chloride 106 96 - 112 mEq/L   CO2 24 19 - 32 mmol/L   Glucose, Bld 101 (H) 70 - 99 mg/dL   BUN 11 6 - 23 mg/dL   Creatinine, Ser 0.77 0.50 - 1.10 mg/dL   Calcium 9.3 8.4 - 10.5 mg/dL   Total Protein 8.0 6.0 - 8.3 g/dL   Albumin 3.3 (L) 3.5 - 5.2 g/dL   AST 22 0 - 37 U/L   ALT 12 0 - 35 U/L   Alkaline Phosphatase 55 39 - 117 U/L   Total Bilirubin 0.8 0.3 - 1.2 mg/dL   GFR calc non Af Amer 77 (L) >90 mL/min   GFR calc Af Amer 89 (L) >90 mL/min    Comment: (NOTE) The eGFR has been calculated using the CKD EPI equation. This calculation has not been validated in all clinical situations. eGFR's persistently <90 mL/min signify possible Chronic Kidney Disease.    Anion gap 5 5 - 15  Brain natriuretic peptide     Status: None    Collection Time: 10/20/14 12:04 PM  Result Value Ref Range   B Natriuretic Peptide 79.5 0.0 - 100.0 pg/mL    Comment: Please note change in reference range.  Magnesium     Status: None   Collection Time: 10/20/14 12:04 PM  Result Value Ref Range   Magnesium 1.6 1.5 - 2.5 mg/dL  Troponin I     Status: None   Collection Time: 10/20/14 12:04 PM  Result Value Ref Range   Troponin I <0.03 <0.031 ng/mL    Comment:        NO INDICATION OF MYOCARDIAL INJURY. Please note change in reference range.   Hemoglobin A1c     Status: None   Collection Time: 10/20/14 12:04 PM  Result Value Ref Range   Hgb A1c MFr Bld 5.4 <5.7 %    Comment: (NOTE)                                                                       According to the ADA Clinical Practice Recommendations for 2011, when HbA1c is used as a screening test:  >=6.5%   Diagnostic of Diabetes Mellitus           (if abnormal result is confirmed) 5.7-6.4%   Increased risk of developing Diabetes Mellitus References:Diagnosis and Classification  of Diabetes Mellitus,Diabetes EYCX,4481,85(UDJSH 1):S62-S69 and Standards of Medical Care in         Diabetes - 2011,Diabetes FWYO,3785,88 (Suppl 1):S11-S61.    Mean Plasma Glucose 108 <117 mg/dL    Comment: Performed at [facility]  TSH     Status: None   Collection Time: 10/20/14  1:57 PM  Result Value Ref Range   TSH 3.105 0.350 - 4.500 uIU/mL    Comment: PERFORMED AT [facility] Performed at [facility]   Troponin I     Status: None   Collection Time: 10/20/14  7:47 PM  Result Value Ref Range   Troponin I <0.03 <0.031 ng/mL    Comment:        NO INDICATION OF MYOCARDIAL INJURY. Please note change in reference range.   Brain natriuretic peptide     Status: None   Collection Time: 10/20/14  7:47 PM  Result Value Ref Range   B Natriuretic Peptide 89.8 0.0 - 100.0 pg/mL    Comment: Please note change in reference range.  Troponin I     Status: None    Collection Time: 10/21/14  1:37 AM  Result Value Ref Range   Troponin I <0.03 <0.031 ng/mL    Comment:        NO INDICATION OF MYOCARDIAL INJURY. Please note change in reference range.   Comprehensive metabolic panel     Status: Abnormal   Collection Time: 10/21/14  1:43 AM  Result Value Ref Range   Sodium 137 135 - 145 mmol/L    Comment: Please note change in reference range.   Potassium 3.9 3.5 - 5.1 mmol/L    Comment: Please note change in reference range.   Chloride 106 96 - 112 mEq/L   CO2 27 19 - 32 mmol/L   Glucose, Bld 97 70 - 99 mg/dL   BUN 13 6 - 23 mg/dL   Creatinine, Ser 0.85 0.50 - 1.10 mg/dL   Calcium 8.9 8.4 - 10.5 mg/dL   Total Protein 6.8 6.0 - 8.3 g/dL   Albumin 2.8 (L) 3.5 - 5.2 g/dL   AST 17 0 - 37 U/L   ALT 10 0 - 35 U/L   Alkaline Phosphatase 49 39 - 117 U/L   Total Bilirubin 0.4 0.3 - 1.2 mg/dL   GFR calc non Af Amer 63 (L) >90 mL/min   GFR calc Af Amer 72 (L) >90 mL/min    Comment: (NOTE) The eGFR has been calculated using the CKD EPI equation. This calculation has not been validated in all clinical situations. eGFR's persistently <90 mL/min signify possible Chronic Kidney Disease.    Anion gap 4 (L) 5 - 15  CBC     Status: Abnormal   Collection Time: 10/21/14  1:43 AM  Result Value Ref Range   WBC 6.4 4.0 - 10.5 K/uL   RBC 4.49 3.87 - 5.11 MIL/uL   Hemoglobin 11.1 (L) 12.0 - 15.0 g/dL   HCT 33.6 (L) 36.0 - 46.0 %   MCV 74.8 (L) 78.0 - 100.0 fL   MCH 24.7 (L) 26.0 - 34.0 pg   MCHC 33.0 30.0 - 36.0 g/dL   RDW 16.3 (H) 11.5 - 15.5 %   Platelets 253 150 - 400 K/uL  Glucose, capillary     Status: None   Collection Time: 10/21/14  8:23 AM  Result Value Ref Range   Glucose-Capillary 84 70 - 99 mg/dL   Comment 1 Documented in Chart    Comment  2 Notify RN      HPI :Connie Mccann 80 y.o. female is here because of diffuse swelling of her hands and legs and abdomen. She reports that these symptoms have been going on for the past 2 weeks.  Previously she had been seen by Dr. Osker Mason for generalized lymphadenopathy and she underwent a PET/CT scan that showed abnormalities not only the lymph nodes but also in the liver. She underwent a right inguinal lymph node biopsy which did not reveal any malignancy. It showed reactive lymphoid hyperplasia. She presented back to the urgent care center with complaints of diffuse swelling of her arms and legs as well as a rash. She had a CT of her chest abdomen pelvis which revealed waxing and waning lymphadenopathy. It appears that several of the abdominal lymph nodes and the liver are smaller but the bilateral axillary lymph nodes are larger. She was prescribed intravenous Lasix. This certainly had helped to decrease the swelling of her hands and feet.   HOSPITAL COURSE:  Generalized lymphadenopathy Repeat CT chest abdomen pelvis reveals lymphadenopathy in the abdomen and liver nodule appeared to be smaller but the bilateral axillary lymph nodes appear to be larger Oncology consulted Do recommend excisional lymph node biopsy of the axillary lymph node Patient is to follow-up with Dr. Alen Blew, LDH mildly elevated  Anasarca Hypoalbuminemia Concern for lymphoid malignancy Patient also has grade 1 acute on chronic diastolic heart failure Start the patient on Lasix 40 mg daily The patient will need repeat BMP in one week Renal Doppler bilateral upper lower extremities were negative, proBNP 89.8 Cardiac enzymes negative TSH within normal limits   Discharge Exam:    Blood pressure 129/54, pulse 75, temperature 98.1 F (36.7 C), temperature source Oral, resp. rate 16, height _0  (1.626 m), weight 70.5 kg (155 lb 6.8 oz), SpO2 96 %.  Neck: Normal range of motion. Neck supple. No tracheal deviation present.  Cardiovascular: Normal rate, regular rhythm and normal heart sounds. Exam reveals no friction rub.  No murmur heard. 2+ pitting edema noted to the lower extremities bilaterally   Pulmonary/Chest: Effort normal and breath sounds normal. No respiratory distress. She has no wheezes. She has no rales.  Musculoskeletal: Normal range of motion.  Swelling identified to the upper extremities and lower extremities bilaterally. Negative erythema, warmth upon palpation, pain upon palpation. Negative abnormalities or rashes identified. Swelling identified to lower extremities bilaterally with 2+ pitting edema noted from the dorsal aspects of the feet is below the knees bilaterally. Negative ulcers noted. Negative signs of infection or cellulitis or joint swelling.  Lymphadenopathy:   She has no cervical adenopathy.        Discharge Instructions    Diet - low sodium heart healthy    Complete by:  As directed      Increase activity slowly    Complete by:  As directed            Follow-up Information    Follow up with [facility], MD. Schedule an appointment as soon as possible for a visit in 3 days.   Specialty:  Family Medicine   Contact information:   [place] [address] [phone]       Follow up with [facility], MD. Call in 1 week.   Specialty:  Oncology   Contact information:   [place]. [place] [address] [phone]       Signed: Reyne Dumas 10/21/2014, 10:45 AM

## 2014-10-22 LAB — PATHOLOGIST SMEAR REVIEW

## 2014-11-14 ENCOUNTER — Ambulatory Visit (HOSPITAL_BASED_OUTPATIENT_CLINIC_OR_DEPARTMENT_OTHER): Payer: Medicare Other | Admitting: Oncology

## 2014-11-14 ENCOUNTER — Telehealth: Payer: Self-pay | Admitting: Oncology

## 2014-11-14 ENCOUNTER — Other Ambulatory Visit (HOSPITAL_BASED_OUTPATIENT_CLINIC_OR_DEPARTMENT_OTHER): Payer: Medicare Other

## 2014-11-14 VITALS — BP 153/55 | HR 74 | Temp 97.7°F | Resp 18 | Ht 64.0 in | Wt 153.8 lb

## 2014-11-14 DIAGNOSIS — K769 Liver disease, unspecified: Secondary | ICD-10-CM

## 2014-11-14 DIAGNOSIS — I890 Lymphedema, not elsewhere classified: Secondary | ICD-10-CM

## 2014-11-14 DIAGNOSIS — R599 Enlarged lymph nodes, unspecified: Secondary | ICD-10-CM

## 2014-11-14 DIAGNOSIS — R591 Generalized enlarged lymph nodes: Secondary | ICD-10-CM

## 2014-11-14 DIAGNOSIS — R601 Generalized edema: Secondary | ICD-10-CM

## 2014-11-14 LAB — CBC WITH DIFFERENTIAL/PLATELET
BASO%: 1.4 % (ref 0.0–2.0)
BASOS ABS: 0.1 10*3/uL (ref 0.0–0.1)
EOS ABS: 1.9 10*3/uL — AB (ref 0.0–0.5)
EOS%: 27.6 % — ABNORMAL HIGH (ref 0.0–7.0)
HCT: 37.4 % (ref 34.8–46.6)
HGB: 11.8 g/dL (ref 11.6–15.9)
LYMPH#: 1.9 10*3/uL (ref 0.9–3.3)
LYMPH%: 27.8 % (ref 14.0–49.7)
MCH: 24 pg — AB (ref 25.1–34.0)
MCHC: 31.4 g/dL — ABNORMAL LOW (ref 31.5–36.0)
MCV: 76.5 fL — ABNORMAL LOW (ref 79.5–101.0)
MONO#: 0.9 10*3/uL (ref 0.1–0.9)
MONO%: 13 % (ref 0.0–14.0)
NEUT%: 30.2 % — AB (ref 38.4–76.8)
NEUTROS ABS: 2.1 10*3/uL (ref 1.5–6.5)
Platelets: 292 10*3/uL (ref 145–400)
RBC: 4.89 10*6/uL (ref 3.70–5.45)
RDW: 16.1 % — AB (ref 11.2–14.5)
WBC: 6.9 10*3/uL (ref 3.9–10.3)

## 2014-11-14 LAB — COMPREHENSIVE METABOLIC PANEL (CC13)
ALK PHOS: 57 U/L (ref 40–150)
ALT: 11 U/L (ref 0–55)
AST: 17 U/L (ref 5–34)
Albumin: 3.1 g/dL — ABNORMAL LOW (ref 3.5–5.0)
Anion Gap: 8 mEq/L (ref 3–11)
BILIRUBIN TOTAL: 0.36 mg/dL (ref 0.20–1.20)
BUN: 24.2 mg/dL (ref 7.0–26.0)
CO2: 27 mEq/L (ref 22–29)
Calcium: 9.8 mg/dL (ref 8.4–10.4)
Chloride: 101 mEq/L (ref 98–109)
Creatinine: 1.3 mg/dL — ABNORMAL HIGH (ref 0.6–1.1)
EGFR: 44 mL/min/{1.73_m2} — ABNORMAL LOW (ref >90)
Glucose: 103 mg/dl (ref 70–140)
Potassium: 4.3 mEq/L (ref 3.5–5.1)
Sodium: 136 mEq/L (ref 136–145)
TOTAL PROTEIN: 8.4 g/dL — AB (ref 6.4–8.3)

## 2014-11-14 NOTE — Progress Notes (Signed)
Hematology and Oncology Follow Up Visit  Connie Mccann [id number] 1931/07/12 81 y.o. 11/14/2014 2:00 PM Connie Mccann, MDShaw, Connie Arrow, MD   Principle Diagnosis: 81 year old woman with diffuse lymphadenopathy suspicious for lymphoma. This was discovered in August of 2015.   Prior Therapy: She is status post left inguinal biopsy which showed lymphoid hyperplasia without any malignancy and on 07/05/2014.  Current therapy: Observation and surveillance.  Interim History: Connie Mccann presents today for a followup visit. Since her last visit, she was hospitalized in December 2015 for generalized anasarca. She had upper and lower extremity swelling and her workup included imaging studies showed continuous lymphadenopathy. It was noted that she had axillary adenopathy that has progressed since the last visit. Since her discharge, she is doing a lot better. She was started on Lasix which have improved her symptoms at this time. She has not reported any other palpable adenopathy at this time. She reports no fevers or chills or sweats. She reports her appetite have been within normal range without any weight loss or appetite changes. She does not report any headaches or blurry vision or syncope. What appears to have some memory issues. She does not report any chest pain orthopnea or PND. She does report some occasional wheezing. She does not report any nausea, vomiting, early satiety she does report left inguinal discomfort. She does not report any frequency urgency or hesitancy. Is not reporting any skeletal complaints. Rest of her review of systems unremarkable.     Medications: I have reviewed the patient's current medications.  Current Outpatient Prescriptions  Medication Sig Dispense Refill  . albuterol (PROAIR HFA) 108 (90 BASE) MCG/ACT inhaler Inhale 2 puffs into the lungs every 4 (four) hours as needed for wheezing or shortness of breath. 1 Inhaler 11  . beclomethasone (QVAR) 80 MCG/ACT inhaler Inhale 1  puff into the lungs 2 (two) times daily. 1 Inhaler 5  . furosemide (LASIX) 20 MG tablet Take 2 tablets (40 mg total) by mouth daily. 30 tablet 2  . losartan (COZAAR) 50 MG tablet Take 1 tablet (50 mg total) by mouth daily. Take as needed for blood pressure  >150. 90 tablet 0  . Multiple Vitamin (MULTIVITAMIN) tablet Take 1 tablet by mouth daily.    . Multiple Vitamins-Minerals (OCUVITE EYE + MULTI) TABS Take 1 tablet by mouth daily.     No current facility-administered medications for this visit.     Allergies: No Known Allergies  Past Medical History, Surgical history, Social history, and Family History were reviewed and updated.   Physical Exam: Blood pressure 153/55, pulse 74, temperature 97.7 F (36.5 C), temperature source Oral, resp. rate 18, height 5\' 4"  (1.626 m), weight 153 lb 12.8 oz (69.763 kg). ECOG: 1 General appearance: alert and cooperative Head: Normocephalic, without obvious abnormality Neck: no adenopathy Lymph nodes: Small right axillary lymph node palpable. Otherwise no palpable adenopathy. Heart:regular rate and rhythm, S1, S2 normal, no murmur, click, rub or gallop Lung:chest clear, no wheezing, rales, normal symmetric air entry Abdomin: soft, non-tender, without masses or organomegaly EXT: Bilateral lower extremity edema left more than the right. She has also 1+ upper extremity edema bilaterally.   Lab Results: Lab Results  Component Value Date   WBC 6.9 11/14/2014   HGB 11.8 11/14/2014   HCT 37.4 11/14/2014   MCV 76.5* 11/14/2014   PLT 292 11/14/2014     Chemistry      Component Value Date/Time   NA 136 11/14/2014 1312   NA 137 10/21/2014 0143  K 4.3 11/14/2014 1312   K 3.9 10/21/2014 0143   CL 106 10/21/2014 0143   CO2 27 11/14/2014 1312   CO2 27 10/21/2014 0143   BUN 24.2 11/14/2014 1312   BUN 13 10/21/2014 0143   CREATININE 1.3* 11/14/2014 1312   CREATININE 0.85 10/21/2014 0143   CREATININE 0.78 10/20/2014 0953      Component Value  Date/Time   CALCIUM 9.8 11/14/2014 1312   CALCIUM 8.9 10/21/2014 0143   ALKPHOS 57 11/14/2014 1312   ALKPHOS 49 10/21/2014 0143   AST 17 11/14/2014 1312   AST 17 10/21/2014 0143   ALT 11 11/14/2014 1312   ALT 10 10/21/2014 0143   BILITOT 0.36 11/14/2014 1312   BILITOT 0.4 10/21/2014 0143     EXAM: CT CHEST, ABDOMEN, AND PELVIS WITH CONTRAST  TECHNIQUE: Multidetector CT imaging of the chest, abdomen and pelvis was performed following the standard protocol during bolus administration of intravenous contrast.  CONTRAST: 158mL OMNIPAQUE IOHEXOL 300 MG/ML SOLN  COMPARISON: CT abdomen and pelvis 05/31/2014. PET CT 06/14/2014  FINDINGS: CT CHEST FINDINGS  There is bilateral axillary adenopathy. Right subpectoral lymph node has enlarged since prior PET CT, measuring 14 mm on image 11 of today's study compared with 10 mm previously. Right axillary lymph node has enlarged despite maintaining central fatty hilum. Short axis diameter measures approximately 21 mm compared with 13 mm previously. Inferior right axillary lymph node on image 21 has a short axis diameter of 15 mm compared with 10 mm previously. Left axillary adenopathy is stable. Index left axillary lymph node on image 22 has a short axis diameter of 7 mm, stable.  Right cardiophrenic angle lymph node has a short axis diameter of 6 mm, stable. No other mediastinal adenopathy. No hilar adenopathy. Heart is upper limits normal in size. Aorta is normal caliber. Lungs are clear. No focal airspace opacities or suspicious nodules. No effusions.  Chest wall soft tissues are unremarkable. No acute bony abnormality.  CT ABDOMEN AND PELVIS FINDINGS  Peripherally enhancing mass within the posterior right hepatic lobe has decreased in size measuring 5.1 cm in maximum dimension compared to 6.3 cm on prior CT abdomen. Small cyst centrally near the IVC, stable. No new hepatic lesions. Spleen, pancreas, adrenals  and kidneys are unremarkable. Small cyst in the midpole of the left kidney. No hydronephrosis.  Lymph node in the right abdomen adjacent to small bowel loops measures 2.7 cm, stable since prior study. Retroperitoneal adenopathy has improved. Index left periaortic lymph node has a short axis diameter of 9 mm on image 68 compared with 20 mm previously. Other retroperitoneal lymph nodes have also decreased. Index left pelvic sidewall lymph node has a short axis diameter of 10 mm compared with 21 mm previously. Left inguinal adenopathy also improved. Index lymph node on image 110 measures 9 mm compared with 19 mm previously.  Diffuse colonic diverticulosis. No active diverticulitis. Stomach and small bowel are unremarkable. Aorta is normal caliber.  Prior hysterectomy. No adnexal masses. Urinary bladder is unremarkable.  No acute bony abnormality or focal bone lesion. Degenerative disc disease throughout the thoracolumbar spine.  IMPRESSION: Mixed response to treatment with improving adenopathy in the abdomen and pelvis, but enlarging right axillary lymph nodes since prior PET CT.    Impression and Plan:  73 year old woman with the following issues:  1. Diffuse lymphadenopathy discovered incidentally with a routine CT scan in August of 2015. She is status post inguinal lymph node biopsy in August 2831 that showed follicular hyperplasia.  She developed generalized anasarca and repeat imaging studies on 10/20/2014 while she was hospitalized was reviewed. She did show progressive axillary lymphadenopathy but improvement in her retroperitoneal lymphadenopathy.  The etiology of these lymphadenopathy is unclear to me but is certainly suspicious for lymphoproliferative process. She does have increased protein in her serum and the generalized anasarca suggestive of lymphatic involvement of systemic disease.  Treatment options were discussed today with the patient and her family.  Continued observation is a possibility but I think she will likely develop more complications and potential worsening lymphadenopathy. The alternative to that is to repeat biopsy and certainly that would be a reasonable option given the enlarging axillary lymphadenopathy.  I will refer her to Dr. Marlou Starks for evaluation of an excisional biopsy of the right or the left axillary lymph node. She is agreeable at this time to proceed with this.  2. Right hepatic lobe lesion measuring 6 cm with a bulk of it does not show any hypermetabolic activity but certainly have some concerning features for malignancy. This have regressed since the last CT scan. Unclear etiology.  3. Generalized anasarca: Likely related to lymphoproliferative process potentially causing her overall edema. This could also be related to a low-protein state causing this but certainly we have to rule out lymphoma.  4. Follow-up: Will be after a repeat biopsy has been completed.  All questions are answered today to her satisfaction.  QJFHLK,TGYBW, MD 1/20/20162:00 PM

## 2014-11-14 NOTE — Telephone Encounter (Signed)
Pt confirmed labs/ov per 01/20 POF, gave pt AVS.... KJ, S/w staff at [facility], req info fax over for referral req for Dr. Autumn Messing III.Marland KitchenMarland KitchenMarland Kitchen

## 2014-12-12 ENCOUNTER — Other Ambulatory Visit: Payer: Self-pay | Admitting: Oncology

## 2014-12-12 DIAGNOSIS — I890 Lymphedema, not elsewhere classified: Secondary | ICD-10-CM

## 2014-12-12 DIAGNOSIS — R599 Enlarged lymph nodes, unspecified: Secondary | ICD-10-CM

## 2014-12-13 ENCOUNTER — Ambulatory Visit: Payer: Self-pay | Admitting: Oncology

## 2014-12-13 ENCOUNTER — Other Ambulatory Visit: Payer: Self-pay

## 2014-12-31 ENCOUNTER — Encounter: Payer: Self-pay | Admitting: Physician Assistant

## 2014-12-31 ENCOUNTER — Ambulatory Visit (INDEPENDENT_AMBULATORY_CARE_PROVIDER_SITE_OTHER): Payer: Medicare Other | Admitting: Physician Assistant

## 2014-12-31 VITALS — BP 142/67 | HR 85 | Ht 64.0 in | Wt 148.0 lb

## 2014-12-31 DIAGNOSIS — E46 Unspecified protein-calorie malnutrition: Secondary | ICD-10-CM

## 2014-12-31 DIAGNOSIS — I10 Essential (primary) hypertension: Secondary | ICD-10-CM

## 2014-12-31 DIAGNOSIS — I890 Lymphedema, not elsewhere classified: Secondary | ICD-10-CM

## 2014-12-31 MED ORDER — AMBULATORY NON FORMULARY MEDICATION: Status: AC

## 2014-12-31 MED ORDER — FUROSEMIDE 20 MG PO TABS: 40.0000 mg | ORAL_TABLET | Freq: Every day | ORAL | Status: AC

## 2014-12-31 MED ORDER — LOSARTAN POTASSIUM 50 MG PO TABS
ORAL_TABLET | ORAL | Status: DC
Start: 2014-12-31 — End: 2015-05-12

## 2014-12-31 NOTE — Patient Instructions (Signed)
Good moisturizer- cetaphil/eurcerin Compression stockings for left leg.  Remember elevation.  Walk a few times a day around house.  Ensure or Boost for increased protein.

## 2014-12-31 NOTE — Progress Notes (Signed)
   Subjective:    Patient ID: Connie Mccann, female    DOB: 07-17-1931, 81 y.o.   MRN: [id number]  HPI  Pt is a 81 yo female who presents to the clinic to establish care. Her 2 daughters accompany her.   .. Active Ambulatory Problems    Diagnosis Date Noted  . HTN (hypertension) 05/21/2012  . GERD (gastroesophageal reflux disease) ?? 05/21/2012  . Other and unspecified hyperlipidemia 07/07/2013  . Enlarged lymph nodes 06/21/2014  . Lymphedema 10/20/2014  . Essential hypertension, benign 12/31/2014   Resolved Ambulatory Problems    Diagnosis Date Noted  . No Resolved Ambulatory Problems   Past Medical History  Diagnosis Date  . Hypertension   . Asthma    . Family History  Problem Relation Age of Onset  . Alzheimer's disease Sister   . Stroke Maternal Aunt    .Marland Kitchen History   Social History  . Marital Status: Married    Spouse Name: N/A  . Number of Children: N/A  . Years of Education: N/A   Occupational History  . Not on file.   Social History Main Topics  . Smoking status: Never Smoker   . Smokeless tobacco: Not on file  . Alcohol Use: No  . Drug Use: No  . Sexual Activity: Not Currently   Other Topics Concern  . Not on file   Social History Narrative   Pt had a enlarged lymphnode in her left inguinal area that was removed in September and benign. Full work up for lymphoma has been done.  Since September she has experienced a lot of left sided lower extermity edema, some left upper exteremity edema and trace right sided edema. She did have low albumin level suggested of low protein and malnourishment. She has increased protien intake with bacon/eggs/salmon/chicken. Pt fills like energy level is good and no other complaints.    Review of Systems  All other systems reviewed and are negative.      Objective:   Physical Exam  Constitutional: She is oriented to person, place, and time. She appears well-developed and well-nourished.  HENT:  Head:  Normocephalic and atraumatic.  Cardiovascular: Normal rate, regular rhythm and normal heart sounds.   Pulmonary/Chest: Effort normal and breath sounds normal.  Neurological: She is alert and oriented to person, place, and time.  Skin: Skin is dry.  Psychiatric: She has a normal mood and affect. Her behavior is normal.          Assessment & Plan:  Lymphedema/enlarged lymph node/malnourishment- unclear etiology. She has had a full work up by hospital, oncologist. Will recheck albumin in the next couple of weeks. Add meal replacment boost, ensure to help increase protein. Will recheck in 4-6 weeks. rx of compression stocking given to help with circulation or excess fluid. Lasix 20mg  daily.   Htn- controlled, no complaints.   GERD- controlled.

## 2015-01-03 ENCOUNTER — Other Ambulatory Visit: Payer: Self-pay

## 2015-01-03 MED ORDER — BECLOMETHASONE DIPROPIONATE 80 MCG/ACT IN AERS: 2.0000 | INHALATION_SPRAY | Freq: Two times a day (BID) | RESPIRATORY_TRACT | Status: AC

## 2015-01-03 NOTE — Telephone Encounter (Signed)
Patient's daughter, York Grice, called and states her mom needs a refill on Qvar. This medication is not on her medication list. She would like it sent to [facility].

## 2015-01-05 DIAGNOSIS — E46 Unspecified protein-calorie malnutrition: Secondary | ICD-10-CM | POA: Insufficient documentation

## 2015-01-16 ENCOUNTER — Encounter: Payer: Self-pay | Admitting: Family Medicine

## 2015-01-16 ENCOUNTER — Ambulatory Visit (INDEPENDENT_AMBULATORY_CARE_PROVIDER_SITE_OTHER): Payer: Medicare Other | Admitting: Family Medicine

## 2015-01-16 VITALS — BP 138/69 | HR 81 | Wt 140.0 lb

## 2015-01-16 DIAGNOSIS — J4531 Mild persistent asthma with (acute) exacerbation: Secondary | ICD-10-CM | POA: Diagnosis not present

## 2015-01-16 MED ORDER — LEVOFLOXACIN 500 MG PO TABS
500.0000 mg | ORAL_TABLET | Freq: Every day | ORAL | Status: DC
Start: 2015-01-16 — End: 2015-01-25

## 2015-01-16 MED ORDER — PREDNISONE 20 MG PO TABS: ORAL_TABLET | ORAL | Status: AC

## 2015-01-16 NOTE — Progress Notes (Signed)
CC: Connie Mccann is a 81 y.o. female is here for Acute Visit   Subjective: HPI:   complains of 2 weeks of productive cough with  Wheezing has been present on a daily basis moderate in severity. Interventions have included over-the-counter cough and cold medication along with Advair however neither of which have seem to help much.  She reports fatigue but denies fevers or chills. She does not think she's had any shortness of breath. Cough seems to be waking her up every few hours at night. She's not certain whether the cough is getting better or worse. Denies chest pain, back pain, confusion facial pain. Review of systems positive for nasal congestion and subjective postnasal drip   Review Of Systems Outlined In HPI  Past Medical History  Diagnosis Date  . Hypertension   . Asthma     Past Surgical History  Procedure Laterality Date  . Inguinal lymph node biopsy     Family History  Problem Relation Age of Onset  . Alzheimer's disease Sister   . Stroke Maternal Aunt     History   Social History  . Marital Status: Married    Spouse Name: N/A  . Number of Children: N/A  . Years of Education: N/A   Occupational History  . Not on file.   Social History Main Topics  . Smoking status: Never Smoker   . Smokeless tobacco: Not on file  . Alcohol Use: No  . Drug Use: No  . Sexual Activity: Not Currently   Other Topics Concern  . Not on file   Social History Narrative     Objective: BP 138/69 mmHg  Pulse 81  Wt 140 lb (63.504 kg)  SpO2 97%  General: Alert and Oriented, No Acute Distress HEENT: Pupils equal, round, reactive to light. Conjunctivae clear.  External ears unremarkable, canals clear with intact TMs with appropriate landmarks.  Middle ear appears open without effusion. Pink inferior turbinates.  Moist mucous membranes, pharynx without inflammation nor lesions.  Neck supple without palpable lymphadenopathy nor abnormal masses. Lungs:  Comfortable work of  breathing without rales nor wheezing. There is moderate rhonchi in both lower lung lobes on expiration. Cardiac: Regular rate and rhythm. Normal S1/S2.  No murmurs, rubs, nor gallops.   Extremities: No peripheral edema.  Strong peripheral pulses.  Mental Status: No depression, anxiety, nor agitation. Skin: Warm and dry.  Assessment & Plan: Connie Mccann was seen today for acute visit.  Diagnoses and all orders for this visit:  Reactive airway disease, mild persistent, with acute exacerbation Orders: -     predniSONE (DELTASONE) 20 MG tablet; Three tabs at once daily for five days. -     levofloxacin (LEVAQUIN) 500 MG tablet; Take 1 tablet (500 mg total) by mouth daily.    exacerbation of reactive airway disease:  Start prednisone and levofloxacin which should cover  Any microbes that would be causing community acquired pneumonia.  Return if symptoms worsen or fail to improve.

## 2015-01-25 ENCOUNTER — Encounter: Payer: Self-pay | Admitting: Physician Assistant

## 2015-01-25 ENCOUNTER — Ambulatory Visit (INDEPENDENT_AMBULATORY_CARE_PROVIDER_SITE_OTHER): Payer: Medicare Other | Admitting: Physician Assistant

## 2015-01-25 VITALS — BP 136/58 | HR 101 | Ht 64.0 in | Wt 141.0 lb

## 2015-01-25 DIAGNOSIS — J4531 Mild persistent asthma with (acute) exacerbation: Secondary | ICD-10-CM

## 2015-01-25 MED ORDER — METHYLPREDNISOLONE ACETATE 40 MG/ML IJ SUSP
40.0000 mg | Freq: Once | INTRAMUSCULAR | Status: AC
  Administered 2015-01-25: 40 mg via INTRAMUSCULAR

## 2015-01-25 MED ORDER — METHYLPREDNISOLONE SODIUM SUCC 125 MG IJ SOLR
125.0000 mg | Freq: Once | INTRAMUSCULAR | Status: AC
  Administered 2015-01-25: 125 mg via INTRAMUSCULAR

## 2015-01-25 MED ORDER — GUAIFENESIN-CODEINE 100-10 MG/5ML PO SYRP: 5.0000 mL | ORAL_SOLUTION | Freq: Every evening | ORAL | Status: DC | PRN

## 2015-01-25 MED ORDER — LEVOFLOXACIN 500 MG PO TABS: 500.0000 mg | ORAL_TABLET | Freq: Every day | ORAL | Status: DC

## 2015-01-25 NOTE — Patient Instructions (Addendum)
2 shots given today in office.   Zyrtec at bedtime 10mg  over the counter.  Flonase 2 sprays each nostril daily.  Albuterol inhaler 2 puffs at least twice a day for a week.  3 more days of antibiotic levaquin.  Continue Ovar 2 puffs twice a day.  cheratussin at bedtime for cough.   Follow up in one week or if symptoms worsening.

## 2015-01-25 NOTE — Progress Notes (Signed)
   Subjective:    Patient ID: Connie Mccann, female    DOB: 02/19/1931, 81 y.o.   MRN: [id number]  HPI  Pt was seen by Dr. Ileene Rubens 01/16/15 for URI with some reactive airway disease. She was treated with levquin and prednisone. She does feel better but still has cough with some production and feels like her chest is tight. Finished levaquin. Taking qvar bid. As rescue inhaler but is not taking it. No fever or chills. Cough is worse at night. Not on anything for allergies.     Review of Systems  All other systems reviewed and are negative.      Objective:   Physical Exam  Constitutional: She is oriented to person, place, and time. She appears well-developed and well-nourished.  HENT:  Head: Normocephalic and atraumatic.  Cardiovascular: Normal rate, regular rhythm and normal heart sounds.   Pulmonary/Chest: Effort normal and breath sounds normal.  Scattered rhonci bilateral lungs with expiratory wheezing.   Neurological: She is alert and oriented to person, place, and time.  Psychiatric: She has a normal mood and affect. Her behavior is normal.          Assessment & Plan:  Reactive airway disease/chest tightness/cough- pulse ox great. Vitals stable. i certainly feel like this is more of an allergy/asthma flare. Solumedrol 125mg  and depo medrol 40mg  IM given today. 3 more days of levquin. qvar refilled. Continue rescue inhaler every 2 hours for next 48 hours then as needed only. Needs to start OTC zyrtec at bedtime. Robitussin given for cough and mucus production. Call if not improving by Monday.

## 2015-02-01 ENCOUNTER — Encounter: Payer: Self-pay | Admitting: Physician Assistant

## 2015-02-01 ENCOUNTER — Ambulatory Visit (INDEPENDENT_AMBULATORY_CARE_PROVIDER_SITE_OTHER): Payer: Medicare Other | Admitting: Physician Assistant

## 2015-02-01 VITALS — BP 132/81 | HR 77 | Wt 133.0 lb

## 2015-02-01 DIAGNOSIS — J4521 Mild intermittent asthma with (acute) exacerbation: Secondary | ICD-10-CM

## 2015-02-01 DIAGNOSIS — J45909 Unspecified asthma, uncomplicated: Secondary | ICD-10-CM | POA: Insufficient documentation

## 2015-02-01 MED ORDER — ALBUTEROL SULFATE HFA 108 (90 BASE) MCG/ACT IN AERS: 2.0000 | INHALATION_SPRAY | RESPIRATORY_TRACT | Status: AC | PRN

## 2015-02-01 NOTE — Progress Notes (Signed)
   Subjective:    Patient ID: Connie Mccann, female    DOB: Jan 10, 1931, 81 y.o.   MRN: [id number]  HPI Pt is a 81 yo female who presents to the clinic with 1 week follow up for reactive airway disease with exacerbation from URI. She is much improved today. She is not coughing and no SOB. No fever, chills, SOB. She takes Qvar daily and Proair only as needed. She has not needed proair in many days. She is using flonase with good results.    Review of Systems  All other systems reviewed and are negative.      Objective:   Physical Exam  Constitutional: She is oriented to person, place, and time. She appears well-developed and well-nourished.  HENT:  Head: Normocephalic and atraumatic.  Right Ear: External ear normal.  Left Ear: External ear normal.  Nose: Nose normal.  Mouth/Throat: Oropharynx is clear and moist.  Eyes: Conjunctivae are normal. Right eye exhibits no discharge. Left eye exhibits no discharge.  Neck: Normal range of motion. Neck supple.  Cardiovascular: Normal rate, regular rhythm and normal heart sounds.   Pulmonary/Chest: Effort normal and breath sounds normal. She has no wheezes.  Lymphadenopathy:    She has no cervical adenopathy.  Neurological: She is alert and oriented to person, place, and time.  Skin: Skin is dry.  Psychiatric: She has a normal mood and affect. Her behavior is normal.          Assessment & Plan:  Reactive airway disease with acute exacerbation- pulse ox 98 percent. Pt doing great. Continue on flonase as needed. Stay on qvar daily. Refilled proair with as needed only instructions. Follow up as needed.

## 2015-04-25 ENCOUNTER — Ambulatory Visit (INDEPENDENT_AMBULATORY_CARE_PROVIDER_SITE_OTHER): Payer: Medicare Other | Admitting: Family Medicine

## 2015-04-25 ENCOUNTER — Encounter: Payer: Self-pay | Admitting: Family Medicine

## 2015-04-25 VITALS — BP 134/66 | HR 98 | Wt 140.0 lb

## 2015-04-25 DIAGNOSIS — R5383 Other fatigue: Secondary | ICD-10-CM

## 2015-04-25 DIAGNOSIS — R718 Other abnormality of red blood cells: Secondary | ICD-10-CM

## 2015-04-25 DIAGNOSIS — E569 Vitamin deficiency, unspecified: Secondary | ICD-10-CM

## 2015-04-25 LAB — IRON AND TIBC
%SAT: 30 % (ref 20–55)
Iron: 46 ug/dL (ref 42–145)
TIBC: 152 ug/dL — ABNORMAL LOW (ref 250–470)
UIBC: 106 ug/dL — ABNORMAL LOW (ref 125–400)

## 2015-04-25 LAB — CBC
HCT: 32.3 % — ABNORMAL LOW (ref 36.0–46.0)
Hemoglobin: 10.9 g/dL — ABNORMAL LOW (ref 12.0–15.0)
MCH: 25.6 pg — ABNORMAL LOW (ref 26.0–34.0)
MCHC: 33.7 g/dL (ref 30.0–36.0)
MCV: 75.8 fL — AB (ref 78.0–100.0)
MPV: 10 fL (ref 8.6–12.4)
Platelets: 296 10*3/uL (ref 150–400)
RBC: 4.26 MIL/uL (ref 3.87–5.11)
RDW: 15.3 % (ref 11.5–15.5)
WBC: 6.2 10*3/uL (ref 4.0–10.5)

## 2015-04-25 LAB — TSH: TSH: 7.075 u[IU]/mL — ABNORMAL HIGH (ref 0.350–4.500)

## 2015-04-25 LAB — VITAMIN B12: Vitamin B-12: 678 pg/mL (ref 211–911)

## 2015-04-25 NOTE — Progress Notes (Signed)
CC: Connie Mccann is a 81 y.o. female is here for fatigue and constipation   Subjective: HPI:  Accompanied by youngest daughter  Patient and family members have noticed over the last 6 months that she's become less physically active and slower to do manual tasks. About a year ago and prior to that she was extremely active individual around the house always involved in some sort of hobby or chore.  She had a minor operation where lymph node was removed a few months ago and since then her husband has asked her to become more sedentary. Now that she is fully recovered from this minor surgery she wants to be more active but does not have the energy for it. She states she is in her regular state of health otherwise. She denies any confusion, pain, nor motor or sensory disturbances other than that described above. She denies any weakness or shortness of breath. Denies irregular heartbeat. No fevers, chills or changes in appetite. No falls.   Review Of Systems Outlined In HPI  Past Medical History  Diagnosis Date  . Hypertension   . Asthma     Past Surgical History  Procedure Laterality Date  . Inguinal lymph node biopsy     Family History  Problem Relation Age of Onset  . Alzheimer's disease Sister   . Stroke Maternal Aunt     History   Social History  . Marital Status: Married    Spouse Name: N/A  . Number of Children: N/A  . Years of Education: N/A   Occupational History  . Not on file.   Social History Main Topics  . Smoking status: Never Smoker   . Smokeless tobacco: Not on file  . Alcohol Use: No  . Drug Use: No  . Sexual Activity: Not Currently   Other Topics Concern  . Not on file   Social History Narrative     Objective: BP 134/66 mmHg  Pulse 98  Wt 140 lb (63.504 kg)  General: Alert and Oriented, No Acute Distress HEENT: Pupils equal, round, reactive to light. Conjunctivae clear.  Moist membranes pharynx unremarkable Lungs: Clear to auscultation  bilaterally, no wheezing/ronchi/rales.  Comfortable work of breathing. Good air movement. Cardiac: Regular rate and rhythm. Normal S1/S2.  no rubs, nor gallops.  Grade 1 over 6 systolic murmur in the left second intercostal space Abdomen: Soft nontender Extremities: No peripheral edema.  Strong peripheral pulses.  Mental Status: No depression, anxiety, nor agitation. Skin: Warm and dry. No cogwheeling or rigidity of the upper extremities.  Assessment & Plan: [name] was seen today for fatigue and constipation.  Diagnoses and all orders for this visit:  Microcytosis Orders: -     CBC -     Iron and TIBC  Other fatigue Orders: -     CBC -     Iron and TIBC -     Vitamin D (25 hydroxy) -     Vitamin B12 -     TSH  Vitamin deficiency Orders: -     Vitamin D (25 hydroxy) -     Vitamin B12 -     TSH   Discussed with patient and family most likely this is due to a vitamin deficiency, iron deficiency, anemia or thyroid abnormality. Will rule out these conditions first and if these are normal will pursue physical therapy.  Return if symptoms worsen or fail to improve.

## 2015-04-26 ENCOUNTER — Telehealth: Payer: Self-pay | Admitting: Family Medicine

## 2015-04-26 DIAGNOSIS — E039 Hypothyroidism, unspecified: Secondary | ICD-10-CM

## 2015-04-26 LAB — VITAMIN D 25 HYDROXY (VIT D DEFICIENCY, FRACTURES): VIT D 25 HYDROXY: 31 ng/mL (ref 30–100)

## 2015-04-26 NOTE — Telephone Encounter (Signed)
Pt and her husband notified. Also called and left a message on daughter's vm listed in the chart.

## 2015-04-26 NOTE — Telephone Encounter (Signed)
Seth Bake, Will you please let patient or her family know that her blood work showed two things that could be causing her fatigue.  First her hemoglobin level was in the anemic range an it looks like it's probably due to an iron deficiency. This can be improved with starting an OTC iron supplement called ferrous sulfate at a dose of 325mg  twice a day with meals.  Also her thyroid function appears depressed therefore I'd recommend she have this rechecked in one week to see if it was a random outlier or a true representation of her thyroid function.  Lab slip in your inbox.

## 2015-05-01 ENCOUNTER — Encounter (HOSPITAL_COMMUNITY): Payer: Self-pay

## 2015-05-01 ENCOUNTER — Emergency Department (HOSPITAL_COMMUNITY): Payer: Medicare Other

## 2015-05-01 ENCOUNTER — Inpatient Hospital Stay (HOSPITAL_COMMUNITY)
Admission: EM | Admit: 2015-05-01 | Discharge: 2015-05-12 | DRG: 823 | Disposition: A | Discharge status: Skilled Nursing Facility | Payer: Medicare Other | Attending: Internal Medicine | Admitting: Internal Medicine

## 2015-05-01 DIAGNOSIS — K769 Liver disease, unspecified: Secondary | ICD-10-CM | POA: Diagnosis present

## 2015-05-01 DIAGNOSIS — D72819 Decreased white blood cell count, unspecified: Secondary | ICD-10-CM | POA: Diagnosis present

## 2015-05-01 DIAGNOSIS — R599 Enlarged lymph nodes, unspecified: Secondary | ICD-10-CM | POA: Diagnosis not present

## 2015-05-01 DIAGNOSIS — G9340 Encephalopathy, unspecified: Secondary | ICD-10-CM | POA: Diagnosis not present

## 2015-05-01 DIAGNOSIS — D649 Anemia, unspecified: Secondary | ICD-10-CM | POA: Diagnosis not present

## 2015-05-01 DIAGNOSIS — E860 Dehydration: Secondary | ICD-10-CM | POA: Diagnosis present

## 2015-05-01 DIAGNOSIS — R531 Weakness: Secondary | ICD-10-CM | POA: Diagnosis present

## 2015-05-01 DIAGNOSIS — R609 Edema, unspecified: Secondary | ICD-10-CM | POA: Diagnosis not present

## 2015-05-01 DIAGNOSIS — G9341 Metabolic encephalopathy: Secondary | ICD-10-CM | POA: Diagnosis present

## 2015-05-01 DIAGNOSIS — E440 Moderate protein-calorie malnutrition: Secondary | ICD-10-CM | POA: Diagnosis present

## 2015-05-01 DIAGNOSIS — R21 Rash and other nonspecific skin eruption: Secondary | ICD-10-CM | POA: Diagnosis present

## 2015-05-01 DIAGNOSIS — N179 Acute kidney failure, unspecified: Secondary | ICD-10-CM

## 2015-05-01 DIAGNOSIS — R5383 Other fatigue: Secondary | ICD-10-CM | POA: Diagnosis not present

## 2015-05-01 DIAGNOSIS — N289 Disorder of kidney and ureter, unspecified: Secondary | ICD-10-CM | POA: Diagnosis not present

## 2015-05-01 DIAGNOSIS — I5033 Acute on chronic diastolic (congestive) heart failure: Secondary | ICD-10-CM | POA: Diagnosis present

## 2015-05-01 DIAGNOSIS — I129 Hypertensive chronic kidney disease with stage 1 through stage 4 chronic kidney disease, or unspecified chronic kidney disease: Secondary | ICD-10-CM | POA: Diagnosis present

## 2015-05-01 DIAGNOSIS — Z79899 Other long term (current) drug therapy: Secondary | ICD-10-CM | POA: Diagnosis not present

## 2015-05-01 DIAGNOSIS — C8590 Non-Hodgkin lymphoma, unspecified, unspecified site: Secondary | ICD-10-CM | POA: Diagnosis not present

## 2015-05-01 DIAGNOSIS — R627 Adult failure to thrive: Secondary | ICD-10-CM | POA: Diagnosis present

## 2015-05-01 DIAGNOSIS — Z6824 Body mass index (BMI) 24.0-24.9, adult: Secondary | ICD-10-CM | POA: Diagnosis not present

## 2015-05-01 DIAGNOSIS — T501X5A Adverse effect of loop [high-ceiling] diuretics, initial encounter: Secondary | ICD-10-CM | POA: Diagnosis present

## 2015-05-01 DIAGNOSIS — N182 Chronic kidney disease, stage 2 (mild): Secondary | ICD-10-CM | POA: Diagnosis present

## 2015-05-01 DIAGNOSIS — R532 Functional quadriplegia: Secondary | ICD-10-CM | POA: Diagnosis present

## 2015-05-01 DIAGNOSIS — R590 Localized enlarged lymph nodes: Secondary | ICD-10-CM | POA: Diagnosis not present

## 2015-05-01 DIAGNOSIS — Z820 Family history of epilepsy and other diseases of the nervous system: Secondary | ICD-10-CM | POA: Diagnosis not present

## 2015-05-01 DIAGNOSIS — Z823 Family history of stroke: Secondary | ICD-10-CM | POA: Diagnosis not present

## 2015-05-01 DIAGNOSIS — R591 Generalized enlarged lymph nodes: Secondary | ICD-10-CM | POA: Diagnosis present

## 2015-05-01 DIAGNOSIS — E876 Hypokalemia: Secondary | ICD-10-CM | POA: Diagnosis present

## 2015-05-01 DIAGNOSIS — R911 Solitary pulmonary nodule: Secondary | ICD-10-CM | POA: Diagnosis present

## 2015-05-01 DIAGNOSIS — R0989 Other specified symptoms and signs involving the circulatory and respiratory systems: Secondary | ICD-10-CM | POA: Diagnosis present

## 2015-05-01 DIAGNOSIS — D638 Anemia in other chronic diseases classified elsewhere: Secondary | ICD-10-CM | POA: Diagnosis present

## 2015-05-01 DIAGNOSIS — R932 Abnormal findings on diagnostic imaging of liver and biliary tract: Secondary | ICD-10-CM | POA: Diagnosis not present

## 2015-05-01 DIAGNOSIS — C865 Angioimmunoblastic T-cell lymphoma: Secondary | ICD-10-CM | POA: Diagnosis present

## 2015-05-01 DIAGNOSIS — J45909 Unspecified asthma, uncomplicated: Secondary | ICD-10-CM | POA: Diagnosis present

## 2015-05-01 DIAGNOSIS — K7689 Other specified diseases of liver: Secondary | ICD-10-CM | POA: Diagnosis not present

## 2015-05-01 DIAGNOSIS — F0390 Unspecified dementia without behavioral disturbance: Secondary | ICD-10-CM | POA: Diagnosis present

## 2015-05-01 DIAGNOSIS — J189 Pneumonia, unspecified organism: Secondary | ICD-10-CM | POA: Diagnosis present

## 2015-05-01 DIAGNOSIS — N19 Unspecified kidney failure: Secondary | ICD-10-CM

## 2015-05-01 DIAGNOSIS — I509 Heart failure, unspecified: Secondary | ICD-10-CM | POA: Diagnosis not present

## 2015-05-01 HISTORY — DX: Non-Hodgkin lymphoma, unspecified, unspecified site: C85.90

## 2015-05-01 LAB — PHOSPHORUS: PHOSPHORUS: 4 mg/dL (ref 2.5–4.6)

## 2015-05-01 LAB — URINALYSIS, ROUTINE W REFLEX MICROSCOPIC
Bilirubin Urine: NEGATIVE
Glucose, UA: NEGATIVE mg/dL
Hgb urine dipstick: NEGATIVE
Ketones, ur: NEGATIVE mg/dL
Leukocytes, UA: NEGATIVE
Nitrite: NEGATIVE
Protein, ur: NEGATIVE mg/dL
Specific Gravity, Urine: 1.011 (ref 1.005–1.030)
UROBILINOGEN UA: 0.2 mg/dL (ref 0.0–1.0)
pH: 6.5 (ref 5.0–8.0)

## 2015-05-01 LAB — CBC WITH DIFFERENTIAL/PLATELET
Basophils Absolute: 0 10*3/uL (ref 0.0–0.1)
Basophils Relative: 1 % (ref 0–1)
Eosinophils Absolute: 0.1 10*3/uL (ref 0.0–0.7)
Eosinophils Relative: 3 % (ref 0–5)
HCT: 33.7 % — ABNORMAL LOW (ref 36.0–46.0)
Hemoglobin: 11.1 g/dL — ABNORMAL LOW (ref 12.0–15.0)
LYMPHS ABS: 1 10*3/uL (ref 0.7–4.0)
Lymphocytes Relative: 19 % (ref 12–46)
MCH: 25.2 pg — ABNORMAL LOW (ref 26.0–34.0)
MCHC: 32.9 g/dL (ref 30.0–36.0)
MCV: 76.4 fL — ABNORMAL LOW (ref 78.0–100.0)
Monocytes Absolute: 1.1 10*3/uL — ABNORMAL HIGH (ref 0.1–1.0)
Monocytes Relative: 23 % — ABNORMAL HIGH (ref 3–12)
NEUTROS PCT: 54 % (ref 43–77)
Neutro Abs: 2.6 10*3/uL (ref 1.7–7.7)
PLATELETS: 250 10*3/uL (ref 150–400)
RBC: 4.41 MIL/uL (ref 3.87–5.11)
RDW: 15.1 % (ref 11.5–15.5)
WBC: 4.9 10*3/uL (ref 4.0–10.5)

## 2015-05-01 LAB — COMPREHENSIVE METABOLIC PANEL
ALK PHOS: 51 U/L (ref 38–126)
ALT: 13 U/L — AB (ref 14–54)
ALT: 11 U/L — ABNORMAL LOW (ref 14–54)
ANION GAP: 4 — AB (ref 5–15)
AST: 21 U/L (ref 15–41)
AST: 16 U/L (ref 15–41)
Albumin: 2.6 g/dL — ABNORMAL LOW (ref 3.5–5.0)
Albumin: 2.3 g/dL — ABNORMAL LOW (ref 3.5–5.0)
Alkaline Phosphatase: 48 U/L (ref 38–126)
Anion gap: 3 — ABNORMAL LOW (ref 5–15)
BUN: 39 mg/dL — ABNORMAL HIGH (ref 6–20)
BUN: 39 mg/dL — ABNORMAL HIGH (ref 6–20)
CO2: 29 mmol/L (ref 22–32)
CO2: 28 mmol/L (ref 22–32)
CREATININE: 2.34 mg/dL — AB (ref 0.44–1.00)
CREATININE: 2.1 mg/dL — AB (ref 0.44–1.00)
Calcium: >15 mg/dL (ref 8.9–10.3)
Calcium: 14.4 mg/dL (ref 8.9–10.3)
Chloride: 101 mmol/L (ref 101–111)
Chloride: 105 mmol/L (ref 101–111)
GFR calc Af Amer: 21 mL/min — ABNORMAL LOW (ref >60)
GFR calc Af Amer: 24 mL/min — ABNORMAL LOW (ref >60)
GFR calc non Af Amer: 18 mL/min — ABNORMAL LOW (ref >60)
GFR calc non Af Amer: 21 mL/min — ABNORMAL LOW (ref >60)
GLUCOSE: 122 mg/dL — AB (ref 65–99)
Glucose, Bld: 125 mg/dL — ABNORMAL HIGH (ref 65–99)
POTASSIUM: 4.6 mmol/L (ref 3.5–5.1)
POTASSIUM: 4.1 mmol/L (ref 3.5–5.1)
Sodium: 134 mmol/L — ABNORMAL LOW (ref 135–145)
Sodium: 136 mmol/L (ref 135–145)
TOTAL PROTEIN: 7.4 g/dL (ref 6.5–8.1)
Total Bilirubin: 0.5 mg/dL (ref 0.3–1.2)
Total Bilirubin: 0.6 mg/dL (ref 0.3–1.2)
Total Protein: 7 g/dL (ref 6.5–8.1)

## 2015-05-01 LAB — CBC
HCT: 31.2 % — ABNORMAL LOW (ref 36.0–46.0)
Hemoglobin: 10.3 g/dL — ABNORMAL LOW (ref 12.0–15.0)
MCH: 25.4 pg — ABNORMAL LOW (ref 26.0–34.0)
MCHC: 33 g/dL (ref 30.0–36.0)
MCV: 77 fL — ABNORMAL LOW (ref 78.0–100.0)
PLATELETS: 238 10*3/uL (ref 150–400)
RBC: 4.05 MIL/uL (ref 3.87–5.11)
RDW: 15.2 % (ref 11.5–15.5)
WBC: 4.3 10*3/uL (ref 4.0–10.5)

## 2015-05-01 LAB — MAGNESIUM: Magnesium: 1.8 mg/dL (ref 1.7–2.4)

## 2015-05-01 LAB — POC OCCULT BLOOD, ED: Fecal Occult Bld: NEGATIVE

## 2015-05-01 MED ORDER — DEXTROSE 5 % IV SOLN
500.0000 mg | INTRAVENOUS | Status: DC
  Administered 2015-05-01 – 2015-05-04 (×4): 500 mg via INTRAVENOUS
  Filled 2015-05-01 (×4): qty 500

## 2015-05-01 MED ORDER — CEFTRIAXONE SODIUM IN DEXTROSE 20 MG/ML IV SOLN
1.0000 g | INTRAVENOUS | Status: DC
  Administered 2015-05-01: 1 g via INTRAVENOUS
  Filled 2015-05-01: qty 50

## 2015-05-01 MED ORDER — SODIUM CHLORIDE 0.9 % IV BOLUS (SEPSIS)
1000.0000 mL | Freq: Once | INTRAVENOUS | Status: AC
  Administered 2015-05-01: 1000 mL via INTRAVENOUS

## 2015-05-01 MED ORDER — BECLOMETHASONE DIPROPIONATE 80 MCG/ACT IN AERS: 2.0000 | INHALATION_SPRAY | Freq: Two times a day (BID) | RESPIRATORY_TRACT | Status: DC

## 2015-05-01 MED ORDER — SODIUM CHLORIDE 0.9 % IV SOLN
1.5000 g | Freq: Two times a day (BID) | INTRAVENOUS | Status: DC
  Administered 2015-05-01 – 2015-05-07 (×12): 1.5 g via INTRAVENOUS
  Filled 2015-05-01 (×13): qty 1.5

## 2015-05-01 MED ORDER — ALBUTEROL SULFATE (2.5 MG/3ML) 0.083% IN NEBU: 2.5000 mg | INHALATION_SOLUTION | RESPIRATORY_TRACT | Status: DC | PRN

## 2015-05-01 MED ORDER — ENOXAPARIN SODIUM 30 MG/0.3ML ~~LOC~~ SOLN
30.0000 mg | SUBCUTANEOUS | Status: DC
  Administered 2015-05-01 – 2015-05-11 (×10): 30 mg via SUBCUTANEOUS
  Filled 2015-05-01 (×10): qty 0.3

## 2015-05-01 MED ORDER — LORATADINE 10 MG PO TABS
10.0000 mg | ORAL_TABLET | Freq: Every day | ORAL | Status: DC
  Administered 2015-05-01 – 2015-05-03 (×3): 10 mg via ORAL
  Filled 2015-05-01 (×3): qty 1

## 2015-05-01 MED ORDER — SODIUM CHLORIDE 0.9 % IV SOLN
INTRAVENOUS | Status: DC
  Administered 2015-05-01: 75 mL/h via INTRAVENOUS
  Administered 2015-05-01 – 2015-05-03 (×4): via INTRAVENOUS

## 2015-05-01 MED ORDER — ALBUTEROL SULFATE (2.5 MG/3ML) 0.083% IN NEBU: 3.0000 mL | INHALATION_SOLUTION | RESPIRATORY_TRACT | Status: DC | PRN

## 2015-05-01 MED ORDER — FERROUS SULFATE 325 (65 FE) MG PO TABS
325.0000 mg | ORAL_TABLET | Freq: Two times a day (BID) | ORAL | Status: DC
  Administered 2015-05-01 – 2015-05-12 (×18): 325 mg via ORAL
  Filled 2015-05-01 (×19): qty 1

## 2015-05-01 MED ORDER — FLUTICASONE PROPIONATE 50 MCG/ACT NA SUSP
2.0000 | Freq: Every day | NASAL | Status: DC
  Administered 2015-05-01 – 2015-05-12 (×12): 2 via NASAL
  Filled 2015-05-01: qty 16

## 2015-05-01 MED ORDER — ENOXAPARIN SODIUM 40 MG/0.4ML ~~LOC~~ SOLN: 40.0000 mg | SUBCUTANEOUS | Status: DC

## 2015-05-01 MED ORDER — BUDESONIDE 0.25 MG/2ML IN SUSP
0.2500 mg | Freq: Two times a day (BID) | RESPIRATORY_TRACT | Status: DC
  Administered 2015-05-01 – 2015-05-12 (×22): 0.25 mg via RESPIRATORY_TRACT
  Filled 2015-05-01 (×23): qty 2

## 2015-05-01 NOTE — ED Notes (Signed)
Family says that she is increasingly becoming more lethargic, weak, decreased appetite and fluid intake for two weeks, pt also has red splotches on her skin and she feels warm.   Family says that she's usually up and about with no problem and this am she lost her balance in the bathroom

## 2015-05-01 NOTE — ED Notes (Signed)
Nurse was told pt can go to floor at 8:50am

## 2015-05-01 NOTE — H&P (Signed)
[facility] History and Physical  Connie Mccann PFX:[id number] DOB: Mar 14, 1931 DOA: 05/01/2015  Referring physician: ED PA, Lenn Sink  PCP: Iran Planas, PA-C   Chief Complaint: weakness   HPI:  Patient is 81 year old female with chronic diastolic CHF, grade 1 based on 2-D echo in 2015, presented to [facility] emergency department with main concern of several weeks duration off progressively worsening fatigue and weakness, poor oral intake, has been mostly bed bound per husband who is present at the bedside at the time of the admission. Patient explains that at baseline she is typically ambulatory and likes to move around but hasn't been able to do so in few weeks. She reports seeing her primary care provider week prior to this admission and was found to be anemic, she was instructed to take iron supplements which she did but her symptoms have not improved. Patient reports subjective fevers and chills at home, mixed episodes and productive and nonproductive cough, she feels like she has lost some weight but is unsure how much. She denies recent sick contacts or exposures, no recent traveling, no specific abdominal concerns, no specific urinary concerns. Patient also denies any specific focal neurological symptoms such as numbness, tingling, no visual changes or headaches, no changes in sensation.  In emergency department, vital signs stable, patient is hemodynamically stable, blood work notable for hemoglobin 11, creatinine 2.34, calcium greater than 15. Chest x-ray concerning for developing pneumonia. TRH asked to admit to telemetry bed for further evaluation.  Assessment and Plan: Active Problems: Generalized weakness - Appears to be multifactorial in etiology and secondary to progressive failure to thrive and dehydration, underlying pneumonia, severe hypercalcemia - Admit to telemetry bed - We'll start with providing supportive care with IV fluids, treat pneumonia with  anti-biotics - Encouraged oral intake as patient able to tolerate - Will need PT OT evaluation once feels better Community acquired pneumonia, unknown pathogen, retrocardiac area - Sputum analysis requested, culture, urine Legionella and strep pneumo - Placed on Zithromax and Rocephin IV for now and readjust clinical regimen as clinically indicated - IS while awake Severe hypercalcemia - Unknown etiology, repeat calcium 14.4 - Provide IV fluids and continue to monitor - Check UPEP/SPEP, PTH, ionized calcium - Repeat BMP in the morning Acute metabolic encephalopathy - Likely secondary to community-acquired pneumonia, hypocalcemia, acute on chronic renal failure - Will need PT/OT evaluation once she feels more medically stable Acute on chronic renal failure, stage II - Based on GFR patient meets criteria for stage II chronic kidney failure - Baseline creatinine in the past 6 months has been 0.9-1.3 - Significantly higher on this admission and likely prerenal in etiology from poor oral intake and dehydration - Provide IV fluids, repeat BMP in the morning, avoid nephrotoxic agents - Hold losartan and Lasix the patient takes at home  Anemia of chronic disease, questionable IDA - Recent anemia panel notable for iron level 46, TIBC 152 - No signs of active bleeding - Repeat CBC in the morning Moderate malnutrition - In the setting of ongoing illness outlined above - Nutritionist consult DVT prophylaxis - Lovenox SQ  Radiological Exams on Admission: Dg Chest 2 View 05/01/2015   Retrocardiac atelectasis or pneumonia with probable small effusion.   Code Status: Full Family Communication: Pt, daughter, husband at bedside Disposition Plan: Admit for further evaluation    [facility] [phone]  Review of Systems:  Constitutional: Negative for diaphoresis.  HENT: Negative for hearing loss, ear pain, nosebleeds, congestion, sore throat, neck  pain, tinnitus and ear discharge.   Eyes:  Negative for blurred vision, double vision, photophobia, pain, discharge and redness.  Respiratory: Negative for cough, hemoptysis, sputum production,  wheezing and stridor.   Cardiovascular: Negative for chest pain, palpitations, orthopnea, claudication and leg swelling.  Gastrointestinal: Negative for vomiting and abdominal pain.  Genitourinary: Negative for dysuria, urgency, frequency, hematuria and flank pain.  Musculoskeletal: Negative for myalgias, back pain, joint pain.  Skin: Negative for itching and rash.  Neurological: Negative for dizziness, speech change, focal weakness, loss of consciousness and headaches.  Endo/Heme/Allergies: Negative for environmental allergies and polydipsia. Does not bruise/bleed easily.  Psychiatric/Behavioral: Negative for suicidal ideas. The patient is not nervous/anxious.      Past Medical History  Diagnosis Date  . Hypertension   . Asthma     Past Surgical History  Procedure Laterality Date  . Inguinal lymph node biopsy      Social History:  reports that she has never smoked. She does not have any smokeless tobacco history on file. She reports that she does not drink alcohol or use illicit drugs.  No Known Allergies  Family History  Problem Relation Age of Onset  . Alzheimer's disease Sister   . Stroke Maternal Aunt     Prior to Admission medications   Medication Sig Start Date End Date Taking? Authorizing Provider  albuterol (PROAIR HFA) 108 (90 BASE) MCG/ACT inhaler Inhale 2 puffs into the lungs every 4 (four) hours as needed for wheezing or shortness of breath. 02/01/15  Yes Jade L Breeback, PA-C  beclomethasone (QVAR) 80 MCG/ACT inhaler Inhale 2 puffs into the lungs 2 (two) times daily. 01/03/15  Yes Jade L Breeback, PA-C  cetirizine (ZYRTEC) 10 MG tablet Take 10 mg by mouth at bedtime.   Yes Historical Provider, MD  ferrous sulfate 325 (65 FE) MG EC tablet Take 1 tablet (325 mg total) by mouth 2 (two) times daily with a meal. 04/26/15  Yes  Sean Hommel, DO  fluticasone (FLONASE) 50 MCG/ACT nasal spray Place 2 sprays into both nostrils daily.   Yes Historical Provider, MD  losartan (COZAAR) 50 MG tablet Take 1 tablet (50 mg total) by mouth daily. Take as needed for blood pressure  >150. 12/31/14  Yes Jade L Breeback, PA-C  Multiple Vitamin (MULTIVITAMIN) tablet Take 1 tablet by mouth daily.   Yes Historical Provider, MD  Multiple Vitamins-Minerals (OCUVITE EYE + MULTI) TABS Take 1 tablet by mouth daily.   Yes Historical Provider, MD  AMBULATORY NON FORMULARY MEDICATION Compression stocking for left leg to knee. (would prefer kind that zip on the medial leg) 30-30mmHG 12/31/14   Jade L Breeback, PA-C  furosemide (LASIX) 20 MG tablet Take 2 tablets (40 mg total) by mouth daily. Patient not taking: Reported on 05/01/2015 12/31/14   Donella Stade, PA-C    Physical Exam: Filed Vitals:   05/01/15 0543 05/01/15 0630 05/01/15 0814  BP: 153/50 135/46 137/48  Pulse: 90 86 88  Temp: 98.5 F (36.9 C)  98.2 F (36.8 C)  TempSrc: Oral  Oral  Resp: 18 14 15   SpO2: 94% 94% 95%    Physical Exam  Constitutional: Appears tired and weak, not in acute distress  HENT: Normocephalic. External right and left ear normal. dry mucous membranes Eyes: Conjunctivae and EOM are normal. PERRLA, no scleral icterus.  Neck: Normal ROM. Neck supple. No JVD. No tracheal deviation. No thyromegaly.  CVS: RRR, S1/S2 +, no murmurs, no gallops, no carotid bruit.  Pulmonary: Effort and breath  sounds normal, no stridor, rhonchi, wheezes, rales.  Abdominal: Soft. BS +,  no distension, tenderness, rebound or guarding.  Musculoskeletal: Normal range of motion. No edema and no tenderness.  Lymphadenopathy: No lymphadenopathy noted, cervical, inguinal. Neuro: Alert. Normal reflexes, muscle tone coordination. No cranial nerve deficit. Skin: Skin is warm and dry. No rash noted. Not diaphoretic. No erythema. No pallor.  Psychiatric: Normal mood and affect.   Labs on  Admission:  Basic Metabolic Panel:  Recent Labs Lab 05/01/15 0606  NA 134*  K 4.6  CL 101  CO2 29  GLUCOSE 122*  BUN 39*  CREATININE 2.34*  CALCIUM >15.0*   Liver Function Tests:  Recent Labs Lab 05/01/15 0606  AST 21  ALT 13*  ALKPHOS 51  BILITOT 0.5  PROT 7.4  ALBUMIN 2.6*   CBC:  Recent Labs Lab 04/25/15 1117 05/01/15 0606  WBC 6.2 4.9  NEUTROABS  --  2.6  HGB 10.9* 11.1*  HCT 32.3* 33.7*  MCV 75.8* 76.4*  PLT 296 250   EKG:  pending   If 7PM-7AM, please contact night-coverage [email or web address] Password [facility] 05/01/2015, 8:37 AM

## 2015-05-01 NOTE — ED Notes (Signed)
Patient transported to X-ray 

## 2015-05-01 NOTE — ED Provider Notes (Signed)
CSN: [id number]     Arrival date & time 05/01/15  0530 History   First MD Initiated Contact with Patient 05/01/15 [phone]     Chief Complaint  Patient presents with  . Altered Mental Status    HPI   81 year old female presents today with fatigue. Patient's husband and daughter are present at time of evaluation. They report that over the last several weeks patient is a decreased appetite, decreased by mouth intake, with progressive weakness. They report that it has come on slowly. They note that over the last several days patient has not wanted to get out of bed, and has been napping frequently. At baseline patient is ambulatory. They deny any preceding illness, trauma, or change in mental status. They report patient is mentating appropriately and is at her baseline. They report that they saw their primary care provider last week, she was found to be anemic, instructed to increase by mouth iron intake. They note follow-up appointment with primary care provider tomorrow for reevaluation and TSH levels. Patient has no specific complaints other than fatigue, denies fever, chills, cough nausea, vomiting, chest pain, abdominal pain, cough, urinary symptoms, lower extremity swelling or edema. Family notes a rash to upper torso and extremities, they report that last year she had several lymph nodes with resulting rashl. No concerning findings.  Past Medical History  Diagnosis Date  . Hypertension   . Asthma    Past Surgical History  Procedure Laterality Date  . Inguinal lymph node biopsy     Family History  Problem Relation Age of Onset  . Alzheimer's disease Sister   . Stroke Maternal Aunt    History  Substance Use Topics  . Smoking status: Never Smoker   . Smokeless tobacco: Not on file  . Alcohol Use: No   OB History    No data available     Review of Systems  All other systems reviewed and are negative.   Allergies  Review of patient's allergies indicates no known allergies.  Home  Medications   Prior to Admission medications   Medication Sig Start Date End Date Taking? Authorizing Provider  albuterol (PROAIR HFA) 108 (90 BASE) MCG/ACT inhaler Inhale 2 puffs into the lungs every 4 (four) hours as needed for wheezing or shortness of breath. 02/01/15  Yes Jade L Breeback, PA-C  beclomethasone (QVAR) 80 MCG/ACT inhaler Inhale 2 puffs into the lungs 2 (two) times daily. 01/03/15  Yes Jade L Breeback, PA-C  cetirizine (ZYRTEC) 10 MG tablet Take 10 mg by mouth at bedtime.   Yes Historical Provider, MD  ferrous sulfate 325 (65 FE) MG EC tablet Take 1 tablet (325 mg total) by mouth 2 (two) times daily with a meal. 04/26/15  Yes Sean Hommel, DO  fluticasone (FLONASE) 50 MCG/ACT nasal spray Place 2 sprays into both nostrils daily.   Yes Historical Provider, MD  losartan (COZAAR) 50 MG tablet Take 1 tablet (50 mg total) by mouth daily. Take as needed for blood pressure  >150. 12/31/14  Yes Jade L Breeback, PA-C  Multiple Vitamin (MULTIVITAMIN) tablet Take 1 tablet by mouth daily.   Yes Historical Provider, MD  Multiple Vitamins-Minerals (OCUVITE EYE + MULTI) TABS Take 1 tablet by mouth daily.   Yes Historical Provider, MD  AMBULATORY NON FORMULARY MEDICATION Compression stocking for left leg to knee. (would prefer kind that zip on the medial leg) 30-50mmHG 12/31/14   Jade L Breeback, PA-C  furosemide (LASIX) 20 MG tablet Take 2 tablets (40 mg total) by  mouth daily. Patient not taking: Reported on 05/01/2015 12/31/14   Jade L Breeback, PA-C   BP 137/48 mmHg  Pulse 88  Temp(Src) 98.2 F (36.8 C) (Oral)  Resp 15  SpO2 95%   Physical Exam  Constitutional: She is oriented to person, place, and time. She appears well-developed and well-nourished.  Pt in exam bed with eyes closed, obvious fatigue responds to verbal instructions   HENT:  Head: Normocephalic and atraumatic.  Eyes: Pupils are equal, round, and reactive to light.  Neck: Normal range of motion. Neck supple. No JVD present. No  tracheal deviation present. No thyromegaly present.  Cardiovascular: Normal rate, regular rhythm and intact distal pulses.  Exam reveals no gallop and no friction rub.   Murmur heard. Systolic   Pulmonary/Chest: Effort normal and breath sounds normal. No stridor. No respiratory distress. She has no wheezes. She has no rales. She exhibits no tenderness.  Abdominal: Soft. She exhibits no distension and no mass. There is no tenderness. There is no rebound and no guarding.  Musculoskeletal: Normal range of motion.  Global weakness noted, 4/5 strength throughout   Lymphadenopathy:    She has no cervical adenopathy.  Neurological: She is alert and oriented to person, place, and time. Coordination normal.  Skin: Skin is warm and dry.  Psychiatric: Her behavior is normal. Judgment and thought content normal.  Nursing note and vitals reviewed.   ED Course  Procedures (including critical care time) Labs Review Labs Reviewed  CBC WITH DIFFERENTIAL/PLATELET - Abnormal; Notable for the following:    Hemoglobin 11.1 (*)    HCT 33.7 (*)    MCV 76.4 (*)    MCH 25.2 (*)    Monocytes Relative 23 (*)    Monocytes Absolute 1.1 (*)    All other components within normal limits  COMPREHENSIVE METABOLIC PANEL - Abnormal; Notable for the following:    Sodium 134 (*)    Glucose, Bld 122 (*)    BUN 39 (*)    Creatinine, Ser 2.34 (*)    Calcium >15.0 (*)    Albumin 2.6 (*)    ALT 13 (*)    GFR calc non Af Amer 18 (*)    GFR calc Af Amer 21 (*)    Anion gap 4 (*)    All other components within normal limits  URINALYSIS, ROUTINE W REFLEX MICROSCOPIC (NOT AT [facility]) - Abnormal; Notable for the following:    APPearance CLOUDY (*)    All other components within normal limits  POC OCCULT BLOOD, ED    Imaging Review Dg Chest 2 View  05/01/2015   CLINICAL DATA:  Fatigue.  Loss of appetite.  EXAM: CHEST  2 VIEW  COMPARISON:  10/20/2014  FINDINGS: There is a retrocardiac opacity which was not seen previously.  The opacity obscures the left diaphragm. There is likely a small associated pleural effusion. Associated lucency is likely residual aerated lung.  Borderline cardiomegaly.  Negative aortic and hilar contours.  IMPRESSION: Retrocardiac atelectasis or pneumonia with probable small effusion. Followup PA and lateral chest X-ray is recommended in 3-4 weeks following trial of antibiotic therapy to ensure resolution.   Electronically Signed   By: Monte Fantasia M.D.   On: 05/01/2015 07:00     EKG Interpretation   Date/Time:  Wednesday May 01 2015 18:29:93 EDT Ventricular Rate:  89 PR Interval:  163 QRS Duration: 101 QT Interval:  336 QTC Calculation: 409 R Axis:   -28 Text Interpretation:  Sinus rhythm Borderline left axis  deviation Low  voltage, precordial leads Consider anterior infarct No significant change  since last tracing Confirmed by WARD,  DO, KRISTEN [phone]) on 05/01/2015  6:31:55 AM      MDM   Final diagnoses:  Hypercalcemia  AKI (acute kidney injury)  Community acquired pneumonia    Labs: Point of care: Blood, urinalysis, CBC, CMP- significant for hemoglobin 11.1, creatinine 2.34, calcium greater than 15, albumin 2.6  Imaging: DG chest 2 view shows pressure cardiac atelectasis or pneumonia with probable small effusion  Consults: Hospitalist  Therapeutics: Normal saline  Assessment:  Plan: Pt presents with slowly progressing fatigue. Patient found to be hypocalcemic, with acute kidney injury, likely community acquired pneumonia. Patient is afebrile, vital signs noncontributory today. Patient given 2 L of normal saline here in the ED, she was in no acute distress during her stay. Uncertain etiology of hypercalcemia, acute kidney injury likely prerenal due to decreased by mouth intake. Hospice was consult at who agreed to admit.      Okey Regal, PA-C 05/02/15 [phone]

## 2015-05-01 NOTE — Progress Notes (Signed)
CRITICAL VALUE ALERT  Critical value received: Calcium 14.4 Date of notification: 05/01/2015 Time of notification:  2993 Critical value read back: yes Nurse who received alert: Arbie Cookey MD notified (1st page): Dr. Garwin Brothers 1116  Time of first page:  1116  MD notified (2nd page):  Time of second page:  Responding MD: Dr. Doyle Askew Time MD responded:  1200

## 2015-05-01 NOTE — ED Provider Notes (Signed)
Medical screening examination/treatment/procedure(s) were conducted as a shared visit with non-physician practitioner(s) and myself.  I personally evaluated the patient during the encounter.   EKG Interpretation   Date/Time:  Wednesday May 01 2015 62:37:62 EDT Ventricular Rate:  89 PR Interval:  163 QRS Duration: 101 QT Interval:  336 QTC Calculation: 409 R Axis:   -28 Text Interpretation:  Sinus rhythm Borderline left axis deviation Low  voltage, precordial leads Consider anterior infarct No significant change  since last tracing Confirmed by WARD,  DO, KRISTEN [phone]) on 05/01/2015  6:31:55 AM      Pt is a 81 y.o. female with history of hypertension, recent diagnosis of anemia who presents to the emergency department with generalized weakness for the past several weeks. No focal neurologic deficits. No fever, cough, vomiting or diarrhea. Afebrile and hemodynamically stable in the ED. Nontoxic appearing. Plan is for basic labs, urine to evaluate for any organic cause for her generalized weakness although I suspect is secondary to patient being elderly, deconditioned. EKG shows no significant change, ischemia, arrhythmia. Patient has follow-up with her primary care provider tomorrow. If workup is unremarkable in the ED, will discharge her with close outpatient follow-up. Family at bedside comfortable with this plan.  Lucas, DO 05/01/15 [phone]

## 2015-05-02 ENCOUNTER — Inpatient Hospital Stay (HOSPITAL_COMMUNITY): Payer: Medicare Other

## 2015-05-02 LAB — BASIC METABOLIC PANEL
Anion gap: 3 — ABNORMAL LOW (ref 5–15)
BUN: 37 mg/dL — ABNORMAL HIGH (ref 6–20)
CALCIUM: 13.8 mg/dL — AB (ref 8.9–10.3)
CO2: 28 mmol/L (ref 22–32)
Chloride: 103 mmol/L (ref 101–111)
Creatinine, Ser: 2.22 mg/dL — ABNORMAL HIGH (ref 0.44–1.00)
GFR calc Af Amer: 23 mL/min — ABNORMAL LOW (ref >60)
GFR, EST NON AFRICAN AMERICAN: 20 mL/min — AB (ref >60)
GLUCOSE: 97 mg/dL (ref 65–99)
POTASSIUM: 3.7 mmol/L (ref 3.5–5.1)
SODIUM: 134 mmol/L — AB (ref 135–145)

## 2015-05-02 LAB — PROTEIN ELECTROPHORESIS, SERUM
A/G Ratio: 0.5 — ABNORMAL LOW (ref 0.7–1.7)
ALPHA-2-GLOBULIN: 0.6 g/dL (ref 0.4–1.0)
Albumin ELP: 2.3 g/dL — ABNORMAL LOW (ref 2.9–4.4)
Alpha-1-Globulin: 0.2 g/dL (ref 0.0–0.4)
BETA GLOBULIN: 0.7 g/dL (ref 0.7–1.3)
GAMMA GLOBULIN: 3 g/dL — AB (ref 0.4–1.8)
Globulin, Total: 4.5 g/dL — ABNORMAL HIGH (ref 2.2–3.9)
Total Protein ELP: 6.8 g/dL (ref 6.0–8.5)

## 2015-05-02 LAB — CBC
HCT: 33.3 % — ABNORMAL LOW (ref 36.0–46.0)
HEMOGLOBIN: 11 g/dL — AB (ref 12.0–15.0)
MCH: 25.3 pg — ABNORMAL LOW (ref 26.0–34.0)
MCHC: 33 g/dL (ref 30.0–36.0)
MCV: 76.6 fL — AB (ref 78.0–100.0)
Platelets: 239 10*3/uL (ref 150–400)
RBC: 4.35 MIL/uL (ref 3.87–5.11)
RDW: 15.3 % (ref 11.5–15.5)
WBC: 4.6 10*3/uL (ref 4.0–10.5)

## 2015-05-02 LAB — STREP PNEUMONIAE URINARY ANTIGEN: STREP PNEUMO URINARY ANTIGEN: NEGATIVE

## 2015-05-02 LAB — HIV ANTIBODY (ROUTINE TESTING W REFLEX): HIV Screen 4th Generation wRfx: NONREACTIVE

## 2015-05-02 NOTE — Evaluation (Signed)
Physical Therapy Evaluation Patient Details Name: Connie Mccann MRN: [id number] DOB: 21-Oct-1931 Today's Date: 05/02/2015   History of Present Illness  81 year old female with hx of chronic diastolic CHF and HTN admitted for generalized weakness and CAP  Clinical Impression  Pt admitted with above diagnosis. Pt currently with functional limitations due to the deficits listed below (see PT Problem List).   Pt will benefit from skilled PT to increase their independence and safety with mobility to allow discharge to the venue listed below.  Pt requiring at least min assist at this time to steady.  May need SNF upon d/c depending on how much assist family can provide at home.     Follow Up Recommendations SNF;Supervision/Assistance - 24 hour    Equipment Recommendations  Rolling walker with 5" wheels    Recommendations for Other Services       Precautions / Restrictions Precautions Precautions: Fall      Mobility  Bed Mobility Overal bed mobility: Needs Assistance Bed Mobility: Supine to Sit;Sit to Supine     Supine to sit: Mod assist Sit to supine: Mod assist   General bed mobility comments: verbal cues for technique, assist for trunk upright and LEs onto bed  Transfers Overall transfer level: Needs assistance Equipment used: 2 person hand held assist;Rolling walker (2 wheeled) Transfers: Sit to/from Stand Sit to Stand: Min assist;+2 safety/equipment         General transfer comment: verbal cues for safe technique, 2 HHA upon rise, assist to rise and steady, better with transfer to sitting with RW  Ambulation/Gait Ambulation/Gait assistance: +2 safety/equipment Ambulation Distance (Feet): 70 Feet Assistive device: Rolling walker (2 wheeled) Gait Pattern/deviations: Step-through pattern;Narrow base of support;Decreased stride length Gait velocity: decr   General Gait Details: verbal cues for step length, RW positioning, posture, very tired but performance somewhat  improved with distance, very uncoordinated gait, unsteady, requiring assist  Stairs            Wheelchair Mobility    Modified Rankin (Stroke Patients Only)       Balance Overall balance assessment: Needs assistance         Standing balance support: Bilateral upper extremity supported Standing balance-Leahy Scale: Poor                               Pertinent Vitals/Pain Pain Assessment: No/denies pain    Home Living Family/patient expects to be discharged to:: Private residence Living Arrangements: Spouse/significant other Available Help at Discharge: Family Type of Home: House Home Access: Stairs to enter     Home Layout: Laundry or work area in [employer]: None      Prior Function Level of Independence: Independent         Comments: pt reports mostly in bed last few weeks however typically ambulatory     Hand Dominance        Extremity/Trunk Assessment   Upper Extremity Assessment: Generalized weakness           Lower Extremity Assessment: Generalized weakness         Communication   Communication: No difficulties  Cognition Arousal/Alertness: Lethargic Behavior During Therapy: WFL for tasks assessed/performed Overall Cognitive Status: Within Functional Limits for tasks assessed                      General Comments      Exercises        Assessment/Plan  PT Assessment Patient needs continued PT services  PT Diagnosis Difficulty walking;Generalized weakness   PT Problem List Decreased strength;Decreased activity tolerance;Decreased balance;Decreased mobility;Decreased coordination;Decreased knowledge of use of DME;Decreased safety awareness  PT Treatment Interventions DME instruction;Gait training;Balance training;Functional mobility training;Patient/family education;Therapeutic activities;Therapeutic exercise   PT Goals (Current goals can be found in the Care Plan section) Acute Rehab PT  Goals PT Goal Formulation: With patient/family Time For Goal Achievement: 05/09/15 Potential to Achieve Goals: Good    Frequency Min 3X/week   Barriers to discharge        Co-evaluation               End of Session Equipment Utilized During Treatment: Gait belt Activity Tolerance: Patient tolerated treatment well Patient left: in bed;with call bell/phone within reach;with bed alarm set;with family/visitor present           Time: 7544-9201 PT Time Calculation (min) (ACUTE ONLY): 16 min   Charges:   PT Evaluation $Initial PT Evaluation Tier I: 1 Procedure     PT G Codes:        Aryannah Mohon,KATHrine E 05/02/2015, 1:13 PM Carmelia Bake, PT, DPT 05/02/2015 Pager: [phone]

## 2015-05-02 NOTE — Progress Notes (Addendum)
Patient ID: Connie Mccann, female   DOB: 1931-07-23, 81 y.o.   MRN: [id number]  [facility] PROGRESS NOTE  Connie Mccann DOB: 13-Feb-1931 DOA: 05/01/2015 PCP: Iran Planas, PA-C   Brief narrative:    Patient is 81 year old female with chronic diastolic CHF, grade 1 based on 2-D echo in 2015, presented to [facility] emergency department with main concern of several weeks duration off progressively worsening fatigue and weakness, poor oral intake, has been mostly bed bound per husband who is present at the bedside at the time of the admission. Patient explains that at baseline she is typically ambulatory and likes to move around but hasn't been able to do so in few weeks. She reports seeing her primary care provider week prior to this admission and was found to be anemic, she was instructed to take iron supplements which she did but her symptoms have not improved. Patient reports subjective fevers and chills at home, mixed episodes and productive and nonproductive cough, she feels like she has lost some weight but is unsure how much. She denies recent sick contacts or exposures, no recent traveling, no specific abdominal concerns, no specific urinary concerns. Patient also denies any specific focal neurological symptoms such as numbness, tingling, no visual changes or headaches, no changes in sensation.  In emergency department, vital signs stable, patient is hemodynamically stable, blood work notable for hemoglobin 11, creatinine 2.34, calcium greater than 15. Chest x-ray concerning for developing pneumonia. [facility] asked to admit to telemetry bed for further evaluation.  Assessment/Plan:    Active Problems: Generalized weakness - Appears to be multifactorial in etiology and secondary to progressive failure to thrive and dehydration, underlying pneumonia, severe hypercalcemia of unclear etiology  - Admit to telemetry bed - continue treating PNA, continue ABX for now day #2 -  Encouraged oral intake as patient able to tolerate  Community acquired pneumonia, unknown pathogen, retrocardiac area - Sputum analysis requested, culture, urine Legionella and strep pneumo, still pending  - Continue Zithromax and Unasyn day #2 - IS while awake  Severe hypercalcemia - Unknown etiology, Ca is slowly trending down  - Follow up on UPEP/SPEP, PTH, ionized calcium - Repeat BMP in the morning  History of extensive lymphadenopathy  - noted on CT abd and pelvis in August 2015 - concern of underlying lymphoma but pt already underwent LN bx and was negative for malignancy - PET scan in 06/2015 was also concerning for liver lesion and MRI was recommended but never done - will d/w oncologist on call   Acute metabolic encephalopathy - Likely secondary to community-acquired pneumonia, hypocalcemia, acute on chronic renal failure - Will need PT/OT evaluation once she feels more medically stable  Acute on chronic renal failure, stage II - Based on GFR patient meets criteria for stage II chronic kidney failure - Baseline creatinine in the past 6 months has been 0.9-1.3 - Significantly higher on this admission and likely prerenal in etiology from poor oral intake and dehydration - holding losartan but Cr still trending up - will continue to monitor  Anemia of chronic disease, microcytic, ? IDA vs chronic process from lymphadenopathy  - Recent anemia panel notable for iron level 46, TIBC 152 - No signs of active bleeding - Repeat CBC in the morning  Moderate malnutrition - In the setting of ongoing illness outlined above - Nutritionist consult  DVT prophylaxis - Lovenox SQ  DVT prophylaxis  Code Status: Full.  Family Communication:  plan of care discussed with the patient  and family Disposition Plan: Home when stable.   IV access:  Peripheral IV  Procedures and diagnostic studies:    Dg Chest 2 View 05/01/2015   Retrocardiac atelectasis or pneumonia with probable small  effusion. Followup PA and lateral chest X-ray is recommended in 3-4 weeks following trial of antibiotic therapy to ensure resolution.     Medical Consultants:  Oncology over the phone   Other Consultants:  None  IAnti-Infectives:   Zithromax 7/6 --> Unasyn 7/6 -->  Connie Ramsay, MD  [facility] Pager [phone]  If 7PM-7AM, please contact night-coverage [email or web address] Password [email or web address] 05/02/2015, 5:51 PM   LOS: 1 day   HPI/Subjective: No events overnight.   Objective: Filed Vitals:   05/01/15 2202 05/02/15 0506 05/02/15 0730 05/02/15 1409  BP: 146/45 136/45  131/54  Pulse: 87 90  85  Temp: 98.4 F (36.9 C) 98.7 F (37.1 C)  98.4 F (36.9 C)  TempSrc: Oral Oral  Axillary  Resp: 17 18  16   Height:      Weight:      SpO2: 96% 98% 98% 100%    Intake/Output Summary (Last 24 hours) at 05/02/15 1751 Last data filed at 05/02/15 1554  Gross per 24 hour  Intake 2347.5 ml  Output    750 ml  Net 1597.5 ml    Exam:   General:  Pt is alert, follows commands appropriately, not in acute distress, tired and weak   Cardiovascular: Regular rate and rhythm, S1/S2, no murmurs, no rubs, no gallops  Respiratory: Clear to auscultation bilaterally, no wheezing, diminished breath sounds at bases   Abdomen: Soft, non tender, non distended, bowel sounds present, no guarding  Extremities: pulses DP and PT palpable bilaterally  Neuro: Grossly nonfocal  Data Reviewed: Basic Metabolic Panel:  Recent Labs Lab 05/01/15 0606 05/01/15 0950 05/02/15 0501  NA 134* 136 134*  K 4.6 4.1 3.7  CL 101 105 103  CO2 29 28 28   GLUCOSE 122* 125* 97  BUN 39* 39* 37*  CREATININE 2.34* 2.10* 2.22*  CALCIUM >15.0* 14.4* 13.8*  MG  --  1.8  --   PHOS  --  4.0  --    Liver Function Tests:  Recent Labs Lab 05/01/15 0606 05/01/15 0950  AST 21 16  ALT 13* 11*  ALKPHOS 51 48  BILITOT 0.5 0.6  PROT 7.4 7.0  ALBUMIN 2.6* 2.3*   CBC:  Recent Labs Lab 05/01/15 0606 05/01/15 0950  05/02/15 0501  WBC 4.9 4.3 4.6  NEUTROABS 2.6  --   --   HGB 11.1* 10.3* 11.0*  HCT 33.7* 31.2* 33.3*  MCV 76.4* 77.0* 76.6*  PLT 250 238 239     Recent Results (from the past 240 hour(s))  Culture, blood (routine x 2) Call MD if unable to obtain prior to antibiotics being given     Status: None (Preliminary result)   Collection Time: 05/01/15  9:50 AM  Result Value Ref Range Status   Specimen Description BLOOD RIGHT WRIST  Final   Special Requests BOTTLES DRAWN AEROBIC AND ANAEROBIC 5CC  Final   Culture   Final    NO GROWTH 1 DAY Performed at [facility]    Report Status PENDING  Incomplete  Culture, blood (routine x 2) Call MD if unable to obtain prior to antibiotics being given     Status: None (Preliminary result)   Collection Time: 05/01/15 10:00 AM  Result Value Ref Range Status   Specimen Description BLOOD RIGHT ARM  Final  Special Requests BOTTLES DRAWN AEROBIC ONLY 6CC  Final   Culture   Final    NO GROWTH 1 DAY Performed at [facility]    Report Status PENDING  Incomplete     Scheduled Meds: . ampicillin-sulbactam (UNASYN) IV  1.5 g Intravenous Q12H  . azithromycin  500 mg Intravenous Q24H  . budesonide (PULMICORT) nebulizer solution  0.25 mg Nebulization BID  . enoxaparin (LOVENOX) injection  30 mg Subcutaneous Q24H  . ferrous sulfate  325 mg Oral BID WC  . fluticasone  2 spray Each Nare Daily  . loratadine  10 mg Oral Daily   Continuous Infusions: . sodium chloride 75 mL/hr at 05/02/15 0531

## 2015-05-02 NOTE — Clinical Social Work Note (Signed)
Clinical Social Work Assessment  Patient Details  Name: Connie Mccann MRN: [id number] Date of Birth: 1931-09-09  Date of referral:  05/02/15               Reason for consult:  Facility Placement                Permission sought to share information with:  Chartered certified accountant granted to share information::  Yes, Verbal Permission Granted  Name::        Agency::     Relationship::     Contact Information:     Housing/Transportation Living arrangements for the past 2 months:  Single Family Home Source of Information:  Patient Patient Interpreter Needed:  None Criminal Activity/Legal Involvement Pertinent to Current Situation/Hospitalization:  No - Comment as needed Significant Relationships:  Adult Children Lives with:  Spouse Do you feel safe going back to the place where you live?  No Need for family participation in patient care:  Yes (Comment)  Care giving concerns:  CSW reviewed PT evaluation recommending SNF at discharge.    Social Worker assessment / plan:  CSW met with patient's children at bedside, patient was asleep in the bed. Patient's children agreeable with plan for rehab before returning home.   Employment status:  Retired Forensic scientist:  Medicare PT Recommendations:  [place] / Referral to community resources:  [place]  Patient/Family's Response to care:  Patient had been living at home with her husband prior to hospitalization but due to current weakened state - they understand the need for her to go to SNF.   Patient/Family's Understanding of and Emotional Response to Diagnosis, Current Treatment, and Prognosis:  Patient's family is very anxious to get answers as to what is going on with her. Dr. Doyle Askew paged to come talk to family.   Emotional Assessment Appearance:  Appears stated age Attitude/Demeanor/Rapport:    Affect (typically observed):  Calm Orientation:  Oriented to Self,  Oriented to Place, Oriented to  Time, Oriented to Situation Alcohol / Substance use:    Psych involvement (Current and /or in the community):     Discharge Needs  Concerns to be addressed:    Readmission within the last 30 days:    Current discharge risk:    Barriers to Discharge:      Standley Brooking, LCSW 05/02/2015, 2:33 PM

## 2015-05-02 NOTE — Clinical Social Work Placement (Signed)
   CLINICAL SOCIAL WORK PLACEMENT  NOTE  Date:  05/02/2015  Patient Details  Name: Connie Mccann MRN: [id number] Date of Birth: 30-Oct-1930  Clinical Social Work is seeking post-discharge placement for this patient at the [place] level of care (*CSW will initial, date and re-position this form in  chart as items are completed):  Yes   Patient/family provided with [place] Work Department's list of facilities offering this level of care within the geographic area requested by the patient (or if unable, by the patient's family).  Yes   Patient/family informed of their freedom to choose among providers that offer the needed level of care, that participate in Medicare, Medicaid or managed care program needed by the patient, have an available bed and are willing to accept the patient.  Yes   Patient/family informed of [facility]'s ownership interest in [facility] and [facility], as well as of the fact that they are under no obligation to receive care at these facilities.  PASRR submitted to EDS on 05/02/15     PASRR number received on 05/02/15     Existing PASRR number confirmed on       FL2 transmitted to all facilities in geographic area requested by pt/family on 05/02/15     FL2 transmitted to all facilities within larger geographic area on       Patient informed that his/her managed care company has contracts with or will negotiate with certain facilities, including the following:            Patient/family informed of bed offers received.  Patient chooses bed at       Physician recommends and patient chooses bed at      Patient to be transferred to   on  .  Patient to be transferred to facility by       Patient family notified on   of transfer.  Name of family member notified:        PHYSICIAN       Additional Comment:    _______________________________________________ Standley Brooking, LCSW 05/02/2015, 2:37 PM

## 2015-05-02 NOTE — Progress Notes (Signed)
CRITICAL VALUE ALERT  Critical value received:  0600  Date of notification:  05/02/2015  Time of notification:  0600  Critical value read back:Yes.    Nurse who received alert:  Erling Conte  MD notified (1st page):  Dr. Hilbert Bible  Time of first page:  0610  MD notified (2nd page): Dr. Hilbert Bible  Time of second page: [phone]  Responding MD:   Time MD responded:

## 2015-05-03 ENCOUNTER — Inpatient Hospital Stay (HOSPITAL_COMMUNITY): Payer: Medicare Other

## 2015-05-03 DIAGNOSIS — E440 Moderate protein-calorie malnutrition: Secondary | ICD-10-CM | POA: Diagnosis present

## 2015-05-03 LAB — CBC
HCT: 33.1 % — ABNORMAL LOW (ref 36.0–46.0)
Hemoglobin: 10.9 g/dL — ABNORMAL LOW (ref 12.0–15.0)
MCH: 25.3 pg — ABNORMAL LOW (ref 26.0–34.0)
MCHC: 32.9 g/dL (ref 30.0–36.0)
MCV: 77 fL — AB (ref 78.0–100.0)
PLATELETS: 234 10*3/uL (ref 150–400)
RBC: 4.3 MIL/uL (ref 3.87–5.11)
RDW: 15.6 % — ABNORMAL HIGH (ref 11.5–15.5)
WBC: 4.2 10*3/uL (ref 4.0–10.5)

## 2015-05-03 LAB — PROTEIN ELECTRO, RANDOM URINE
ALBUMIN ELP UR: 9.7 %
ALPHA-2-GLOBULIN, U: 6.6 %
Alpha-1-Globulin, U: 2.4 %
Beta Globulin, U: 28.2 %
Gamma Globulin, U: 53 %
M Component, Ur: 22.1 % — ABNORMAL HIGH
TOTAL PROTEIN, URINE-UPE24: 16.7 mg/dL

## 2015-05-03 LAB — BASIC METABOLIC PANEL
Anion gap: 6 (ref 5–15)
BUN: 35 mg/dL — ABNORMAL HIGH (ref 6–20)
CALCIUM: 13.3 mg/dL — AB (ref 8.9–10.3)
CO2: 24 mmol/L (ref 22–32)
Chloride: 107 mmol/L (ref 101–111)
Creatinine, Ser: 2.13 mg/dL — ABNORMAL HIGH (ref 0.44–1.00)
GFR, EST AFRICAN AMERICAN: 24 mL/min — AB (ref >60)
GFR, EST NON AFRICAN AMERICAN: 21 mL/min — AB (ref >60)
Glucose, Bld: 89 mg/dL (ref 65–99)
POTASSIUM: 3.8 mmol/L (ref 3.5–5.1)
SODIUM: 137 mmol/L (ref 135–145)

## 2015-05-03 LAB — C-REACTIVE PROTEIN: CRP: 1.2 mg/dL — ABNORMAL HIGH (ref <1.0)

## 2015-05-03 LAB — LEGIONELLA ANTIGEN, URINE

## 2015-05-03 LAB — SEDIMENTATION RATE: Sed Rate: 64 mm/hr — ABNORMAL HIGH (ref 0–22)

## 2015-05-03 MED ORDER — FUROSEMIDE 10 MG/ML IJ SOLN
20.0000 mg | Freq: Once | INTRAMUSCULAR | Status: AC
  Administered 2015-05-03: 20 mg via INTRAVENOUS
  Filled 2015-05-03: qty 2

## 2015-05-03 MED ORDER — ENSURE ENLIVE PO LIQD
237.0000 mL | Freq: Two times a day (BID) | ORAL | Status: DC
  Administered 2015-05-04 – 2015-05-12 (×13): 237 mL via ORAL

## 2015-05-03 MED ORDER — HYDRALAZINE HCL 20 MG/ML IJ SOLN
10.0000 mg | Freq: Four times a day (QID) | INTRAMUSCULAR | Status: DC | PRN
  Administered 2015-05-03: 10 mg via INTRAVENOUS
  Filled 2015-05-03: qty 1

## 2015-05-03 MED ORDER — SODIUM CHLORIDE 0.9 % IV SOLN
60.0000 mg | Freq: Once | INTRAVENOUS | Status: AC
  Administered 2015-05-03: 60 mg via INTRAVENOUS
  Filled 2015-05-03: qty 20

## 2015-05-03 NOTE — Progress Notes (Signed)
Initial Nutrition Assessment   DOCUMENTATION CODES:  Non-severe (moderate) malnutrition in context of acute illness/injury  INTERVENTION: - Will order Ensure Enlive po BID, each supplement provides 350 kcal and 20 grams of protein - RD will continue to monitor for needs  NUTRITION DIAGNOSIS:  Inadequate oral intake related to acute illness as evidenced by per patient/family report.  GOAL:  Patient will meet greater than or equal to 90% of their needs  MONITOR:  PO intake, Supplement acceptance, Weight trends, Labs  REASON FOR ASSESSMENT:  Consult Assessment of nutrition requirement/status  ASSESSMENT: 81 year old female with chronic diastolic CHF, grade 1 based on 2-D echo in 2015, presented to [facility] emergency department with main concern of several weeks duration off progressively worsening fatigue and weakness, poor oral intake, has been mostly bed bound per husband who is present at the bedside at the time of the admission. Patient explains that at baseline she is typically ambulatory and likes to move around but hasn't been able to do so in few weeks.   Pt seen for consult. BMI indicates normal weight status. No muscle or fat wasting noted at this time. Pt sleeping at time of visit and all information provided by daughter and grandson.  Pt was made NPO around 1015 today. Prior to this, she ate a few small bites of breakfast (diet order: Regular). Per chart review, on 7/6 pt ate 50% of breakfast and lunch and 25% of dinner and yesterday she consumed 10% of lunch.  Prior to admission, pt had a poor appetite for a few days due to not feeling well but prior to this she had a very good appetite. She was not using nutrition supplements such as Ensure at home but family is interested in trying them as pt is consuming so little. They state that they have tried to order her liquids and items that require minimal chewing but pt does not seem interested in swallowing. Per MD notes,  pt with acute metabolic encephalopathy.  Family reports that PTA pt's UBW was 148 lbs. Per chart review, pt last weighed this in 12/2014 indicating 9 lb weight loss (6% weight loss) in 4 months which is not significant for time frame.   Likely not meeting needs on average. Medications reviewed. Labs reviewed; BUN/creatinine elevated, Ca: 13.3 mg/dL, GFR: 24.  Height:  Ht Readings from Last 1 Encounters:  05/01/15 5\' 3"  (1.6 m)    Weight:  Wt Readings from Last 1 Encounters:  05/01/15 139 lb (63.05 kg)    Ideal Body Weight:  52.3 kg (kg)  Wt Readings from Last 10 Encounters:  05/01/15 139 lb (63.05 kg)  04/25/15 140 lb (63.504 kg)  02/01/15 133 lb (60.328 kg)  01/25/15 141 lb (63.957 kg)  01/16/15 140 lb (63.504 kg)  12/31/14 148 lb (67.132 kg)  11/14/14 153 lb 12.8 oz (69.763 kg)  10/20/14 155 lb 6.8 oz (70.5 kg)  10/20/14 159 lb 3.2 oz (72.213 kg)  07/12/14 146 lb 12.8 oz (66.588 kg)    BMI:  Body mass index is 24.63 kg/(m^2).  Estimated Nutritional Needs:  Kcal:  1300-1500  Protein:  60-70 grams  Fluid:  2.2 L/day  Skin:  Reviewed, no issues  Diet Order:  Diet NPO time specified  EDUCATION NEEDS:  No education needs identified at this time   Intake/Output Summary (Last 24 hours) at 05/03/15 1344 Last data filed at 05/03/15 1341  Gross per 24 hour  Intake 2435.84 ml  Output   1400 ml  Net  1035.84 ml    Last BM:  7/5    Jarome Matin, RD, LDN Inpatient Clinical Dietitian Pager # [phone] After hours/weekend pager # [phone]

## 2015-05-03 NOTE — Progress Notes (Signed)
ANTIBIOTIC CONSULT NOTE - FOLLOW UP  Pharmacy Consult for renal adjustment for antibiotics Indication: pneumonia  No Known Allergies  Patient Measurements: Height: 5\' 3"  (160 cm) Weight: 139 lb (63.05 kg) IBW/kg (Calculated) : 52.4 Adjusted Body Weight:   Vital Signs: Temp: 98 F (36.7 C) (07/08 0559) Temp Source: Oral (07/08 0559) BP: 142/50 mmHg (07/08 0648) Pulse Rate: 102 (07/08 0648) Intake/Output from previous day: 07/07 0701 - 07/08 0700 In: 2855.8 [P.O.:240; I.V.:2265.8; IV Piggyback:350] Out: 900 [Urine:900] Intake/Output from this shift: Total I/O In: -  Out: 300 [Urine:300]  Labs:  Recent Labs  05/01/15 0950 05/02/15 0501 05/03/15 0451  WBC 4.3 4.6 4.2  HGB 10.3* 11.0* 10.9*  PLT 238 239 234  CREATININE 2.10* 2.22* 2.13*   Estimated Creatinine Clearance: 18.5 mL/min (by C-G formula based on Cr of 2.13). No results for input(s): VANCOTROUGH, VANCOPEAK, VANCORANDOM, GENTTROUGH, GENTPEAK, GENTRANDOM, TOBRATROUGH, TOBRAPEAK, TOBRARND, AMIKACINPEAK, AMIKACINTROU, AMIKACIN in the last 72 hours.   Microbiology: Recent Results (from the past 720 hour(s))  Culture, blood (routine x 2) Call MD if unable to obtain prior to antibiotics being given     Status: None (Preliminary result)   Collection Time: 05/01/15  9:50 AM  Result Value Ref Range Status   Specimen Description BLOOD RIGHT WRIST  Final   Special Requests BOTTLES DRAWN AEROBIC AND ANAEROBIC 5CC  Final   Culture   Final    NO GROWTH 1 DAY Performed at [facility]    Report Status PENDING  Incomplete  Culture, blood (routine x 2) Call MD if unable to obtain prior to antibiotics being given     Status: None (Preliminary result)   Collection Time: 05/01/15 10:00 AM  Result Value Ref Range Status   Specimen Description BLOOD RIGHT ARM  Final   Special Requests BOTTLES DRAWN AEROBIC ONLY 6CC  Final   Culture   Final    NO GROWTH 1 DAY Performed at [facility]    Report Status  PENDING  Incomplete    Anti-infectives    Start     Dose/Rate Route Frequency Ordered Stop   05/01/15 2200  ampicillin-sulbactam (UNASYN) 1.5 g in sodium chloride 0.9 % 50 mL IVPB     1.5 g 100 mL/hr over 30 Minutes Intravenous Every 12 hours 05/01/15 1442     05/01/15 1100  azithromycin (ZITHROMAX) 500 mg in dextrose 5 % 250 mL IVPB     500 mg 250 mL/hr over 60 Minutes Intravenous Every 24 hours 05/01/15 0920 05/08/15 1059   05/01/15 1000  cefTRIAXone (ROCEPHIN) 1 g in dextrose 5 % 50 mL IVPB - Premix  Status:  Discontinued     1 g 100 mL/hr over 30 Minutes Intravenous Every 24 hours 05/01/15 0920 05/01/15 1442      Assessment: 83 YOF presents with weakness. Being treated for CAP, originally prescribed ceftriaxone and azithromycin but d/t sever hypercalcemia, pharmacy suggested change to unasyn (issue with precipitation is mainly with neonates).  Noted to have AKI.    7/6 >> ceftriaxone >> 7/6 7/6 >> azithromycin >> 7/6  >> unasyn >>  7/6 Blood: NGTD 7/ sputum: not collected Strep Ag: neg Legionella Ag: pending  Renal: current CrCl < 39ml/min Afebrile, WBC WNL  Goal of Therapy:  Dose per patient-specific parameters  Plan:  Day #3 antibiotics  Continue Unasyn 1.5mg  IV q12h based on current renal function  Pharmacy to intervene if dose adjustment necessary  Doreene Eland, PharmD, BCPS.   Pager: [phone] 05/03/2015,8:05 AM

## 2015-05-03 NOTE — Progress Notes (Signed)
Reviewed Ct chest, suggestive of anasarca and CHF, will stop fluid and give one dose of Lasix IV. Monitor clinical response.  Faye Ramsay, MD  [facility] Pager [phone]  If 7PM-7AM, please contact night-coverage [email or web address] Password [email or web address]

## 2015-05-03 NOTE — Care Management (Signed)
Important Message  Patient Details  Name: Connie Mccann MRN: [id number] Date of Birth: 1931-07-05   Medicare Important Message Given:  Yes-second notification given    Camillo Flaming 05/03/2015, 11:12 AM

## 2015-05-03 NOTE — Progress Notes (Signed)
CRITICAL VALUE ALERT  Critical value received:  4847  Date of notification:  05/03/2015  Time of notification:  0525  Critical value read back:Yes.    Nurse who received alert:  Erling Conte, RN   MD notified (1st page):  Dr. Rogue Bussing   Time of first page:  0526  MD notified (2nd page): Dr. Rogue Bussing  Time of second page: [phone]  Responding MD:    Time MD responded:

## 2015-05-03 NOTE — Progress Notes (Signed)
Patient ID: Connie Mccann, female   DOB: 01/12/1931, 81 y.o.   MRN: [id number]  [facility] PROGRESS NOTE  Connie Mccann ZSW:[id number] DOB: Apr 03, 1931 DOA: 05/01/2015 PCP: Iran Planas, PA-C   Brief narrative:    Patient is 81 year old female with chronic diastolic CHF, grade 1 based on 2-D echo in 2015, presented to [facility] emergency department with main concern of several weeks duration off progressively worsening fatigue and weakness, poor oral intake, has been mostly bed bound per husband who is present at the bedside at the time of the admission. Patient explains that at baseline she is typically ambulatory and likes to move around but hasn't been able to do so in few weeks. She reports seeing her primary care provider week prior to this admission and was found to be anemic, she was instructed to take iron supplements which she did but her symptoms have not improved. Patient reports subjective fevers and chills at home, mixed episodes and productive and nonproductive cough, she feels like she has lost some weight but is unsure how much. She denies recent sick contacts or exposures, no recent traveling, no specific abdominal concerns, no specific urinary concerns. Patient also denies any specific focal neurological symptoms such as numbness, tingling, no visual changes or headaches, no changes in sensation.  In emergency department, vital signs stable, patient is hemodynamically stable, blood work notable for hemoglobin 11, creatinine 2.34, calcium greater than 15. Chest x-ray concerning for developing pneumonia. [facility] asked to admit to telemetry bed for further evaluation.  Assessment/Plan:    Active Problems: Generalized weakness - Appears to be multifactorial in etiology and secondary to progressive failure to thrive and dehydration, underlying pneumonia, severe hypercalcemia of unclear etiology  - continue treating PNA, continue ABX for now day #3 - Encouraged oral intake as  patient able to tolerate - PT evaluation if pt able to toelrate   Community acquired pneumonia, unknown pathogen, retrocardiac area - Sputum analysis requested, culture, urine Legionella and strep pneumo, negative to date  - Continue Zithromax and Unasyn day #3/7 - IS while awake  Severe hypercalcemia - Unknown etiology, Ca is slowly trending down  - Follow up on UPEP/SPEP, PTH, ionized calcium which are still pending  - Repeat BMP in the morning  History of extensive lymphadenopathy  - noted on CT abd and pelvis in August 2015 - concern of underlying lymphoma but pt already underwent LN bx and was negative for malignancy - PET scan in 06/2015 was also concerning for liver lesion and MRI was recommended but never done - plan to obtain MRI abd without contrast with focus on liver area - peripheral smear requested for review  - once this is back, will touch base again with oncologist   Acute metabolic encephalopathy - Likely secondary to community-acquired pneumonia, hypocalcemia, acute on chronic renal failure - Will need PT/OT evaluation once she feels more medically stable  Acute on chronic renal failure, stage II - Based on GFR patient meets criteria for stage II chronic kidney failure - Baseline creatinine in the past 6 months has been 0.9-1.3 - Significantly higher on this admission and likely prerenal in etiology from poor oral intake and dehydration - holding losartan, Cr is somewhat better this AM - renal US with no hydro and suggestive of chronic renal disease, so this could be simply new Cr baseline  - will continue to monitor  Anemia of chronic disease, microcytic, ? IDA vs chronic process from lymphadenopathy  - Recent anemia panel notable  for iron level 46, TIBC 152 - No signs of active bleeding - Repeat CBC in the morning  Moderate malnutrition - In the setting of ongoing illness outlined above - Nutritionist consult  DVT prophylaxis - Lovenox SQ  Code Status:  Full.  Family Communication:  plan of care discussed with the patient and family Disposition Plan: Home when stable.   IV access:  Peripheral IV  Procedures and diagnostic studies:    Dg Chest 2 View 05/01/2015   Retrocardiac atelectasis or pneumonia with probable small effusion. Followup PA and lateral chest X-ray is recommended in 3-4 weeks following trial of antibiotic therapy to ensure resolution.     Medical Consultants:  Oncology over the phone   Other Consultants:  None  IAnti-Infectives:   Zithromax 7/6 --> Unasyn 7/6 -->  Faye Ramsay, MD  [facility] Pager [phone]  If 7PM-7AM, please contact night-coverage [email or web address] Password [facility] 05/03/2015, 12:42 PM   LOS: 2 days   HPI/Subjective: No events overnight.   Objective: Filed Vitals:   05/02/15 2016 05/02/15 2105 05/03/15 0559 05/03/15 0648  BP:  158/51 176/62 142/50  Pulse:  100 106 102  Temp:  98.3 F (36.8 C) 98 F (36.7 C)   TempSrc:  Axillary Oral   Resp:  18 18   Height:      Weight:      SpO2: 92% 98% 97%     Intake/Output Summary (Last 24 hours) at 05/03/15 1242 Last data filed at 05/03/15 1016  Gross per 24 hour  Intake 2315.84 ml  Output   1400 ml  Net 915.84 ml    Exam:   General:  Pt is alert, follows commands appropriately, not in acute distress, tired and weak   Cardiovascular: Regular rhythm, tachycardic, S1/S2, no murmurs, no rubs, no gallops  Respiratory: Clear to auscultation bilaterally, no wheezing, diminished breath sounds at bases   Abdomen: Soft, non tender, non distended, bowel sounds present, no guarding  Extremities: pulses DP and PT palpable bilaterally  Neuro: Grossly nonfocal  Data Reviewed: Basic Metabolic Panel:  Recent Labs Lab 05/01/15 0606 05/01/15 0950 05/02/15 0501 05/03/15 0451  NA 134* 136 134* 137  K 4.6 4.1 3.7 3.8  CL 101 105 103 107  CO2 29 28 28 24   GLUCOSE 122* 125* 97 89  BUN 39* 39* 37* 35*  CREATININE 2.34* 2.10* 2.22* 2.13*   CALCIUM >15.0* 14.4* 13.8* 13.3*  MG  --  1.8  --   --   PHOS  --  4.0  --   --    Liver Function Tests:  Recent Labs Lab 05/01/15 0606 05/01/15 0950  AST 21 16  ALT 13* 11*  ALKPHOS 51 48  BILITOT 0.5 0.6  PROT 7.4 7.0  ALBUMIN 2.6* 2.3*   CBC:  Recent Labs Lab 05/01/15 0606 05/01/15 0950 05/02/15 0501 05/03/15 0451  WBC 4.9 4.3 4.6 4.2  NEUTROABS 2.6  --   --   --   HGB 11.1* 10.3* 11.0* 10.9*  HCT 33.7* 31.2* 33.3* 33.1*  MCV 76.4* 77.0* 76.6* 77.0*  PLT 250 238 239 234     Recent Results (from the past 240 hour(s))  Culture, blood (routine x 2) Call MD if unable to obtain prior to antibiotics being given     Status: None (Preliminary result)   Collection Time: 05/01/15  9:50 AM  Result Value Ref Range Status   Specimen Description BLOOD RIGHT WRIST  Final   Special Requests BOTTLES DRAWN AEROBIC AND ANAEROBIC 5CC  Final   Culture   Final    NO GROWTH 1 DAY Performed at [facility]    Report Status PENDING  Incomplete  Culture, blood (routine x 2) Call MD if unable to obtain prior to antibiotics being given     Status: None (Preliminary result)   Collection Time: 05/01/15 10:00 AM  Result Value Ref Range Status   Specimen Description BLOOD RIGHT ARM  Final   Special Requests BOTTLES DRAWN AEROBIC ONLY 6CC  Final   Culture   Final    NO GROWTH 1 DAY Performed at [facility]    Report Status PENDING  Incomplete     Scheduled Meds: . ampicillin-sulbactam (UNASYN) IV  1.5 g Intravenous Q12H  . azithromycin  500 mg Intravenous Q24H  . budesonide (PULMICORT) nebulizer solution  0.25 mg Nebulization BID  . enoxaparin (LOVENOX) injection  30 mg Subcutaneous Q24H  . ferrous sulfate  325 mg Oral BID WC  . fluticasone  2 spray Each Nare Daily  . loratadine  10 mg Oral Daily   Continuous Infusions: . sodium chloride 125 mL/hr at 05/03/15 1217

## 2015-05-03 NOTE — Progress Notes (Signed)
Pt's family refused lab draw, states they do not want pt "stuck" repeatedly, requesting PICC line, physician notified

## 2015-05-04 LAB — CBC WITH DIFFERENTIAL/PLATELET
BASOS ABS: 0 10*3/uL (ref 0.0–0.1)
Basophils Relative: 1 % (ref 0–1)
Eosinophils Absolute: 0.1 10*3/uL (ref 0.0–0.7)
Eosinophils Relative: 3 % (ref 0–5)
HCT: 32.4 % — ABNORMAL LOW (ref 36.0–46.0)
Hemoglobin: 10.5 g/dL — ABNORMAL LOW (ref 12.0–15.0)
LYMPHS PCT: 31 % (ref 12–46)
Lymphs Abs: 1.3 10*3/uL (ref 0.7–4.0)
MCH: 24.7 pg — ABNORMAL LOW (ref 26.0–34.0)
MCHC: 32.4 g/dL (ref 30.0–36.0)
MCV: 76.2 fL — ABNORMAL LOW (ref 78.0–100.0)
MONO ABS: 0.6 10*3/uL (ref 0.1–1.0)
Monocytes Relative: 13 % — ABNORMAL HIGH (ref 3–12)
NEUTROS ABS: 2.2 10*3/uL (ref 1.7–7.7)
NEUTROS PCT: 52 % (ref 43–77)
PLATELETS: 235 10*3/uL (ref 150–400)
RBC: 4.25 MIL/uL (ref 3.87–5.11)
RDW: 15.6 % — ABNORMAL HIGH (ref 11.5–15.5)
WBC: 4.2 10*3/uL (ref 4.0–10.5)

## 2015-05-04 LAB — BASIC METABOLIC PANEL
ANION GAP: 6 (ref 5–15)
BUN: 37 mg/dL — ABNORMAL HIGH (ref 6–20)
CO2: 25 mmol/L (ref 22–32)
CREATININE: 2.1 mg/dL — AB (ref 0.44–1.00)
Calcium: 12.8 mg/dL — ABNORMAL HIGH (ref 8.9–10.3)
Chloride: 106 mmol/L (ref 101–111)
GFR calc non Af Amer: 21 mL/min — ABNORMAL LOW (ref >60)
GFR, EST AFRICAN AMERICAN: 24 mL/min — AB (ref >60)
Glucose, Bld: 98 mg/dL (ref 65–99)
Potassium: 3.3 mmol/L — ABNORMAL LOW (ref 3.5–5.1)
Sodium: 137 mmol/L (ref 135–145)

## 2015-05-04 MED ORDER — POTASSIUM CHLORIDE CRYS ER 20 MEQ PO TBCR
40.0000 meq | EXTENDED_RELEASE_TABLET | Freq: Once | ORAL | Status: AC
  Administered 2015-05-04: 40 meq via ORAL
  Filled 2015-05-04: qty 2

## 2015-05-04 MED ORDER — FUROSEMIDE 10 MG/ML IJ SOLN
20.0000 mg | Freq: Two times a day (BID) | INTRAMUSCULAR | Status: DC
  Administered 2015-05-04 – 2015-05-06 (×5): 20 mg via INTRAVENOUS
  Filled 2015-05-04 (×5): qty 2

## 2015-05-04 MED ORDER — SODIUM CHLORIDE 0.9 % IJ SOLN
10.0000 mL | INTRAMUSCULAR | Status: DC | PRN
  Administered 2015-05-05 – 2015-05-06 (×2): 10 mL
  Administered 2015-05-08: 20 mL
  Administered 2015-05-08 – 2015-05-12 (×3): 10 mL
  Filled 2015-05-04 (×6): qty 40

## 2015-05-04 MED ORDER — SODIUM CHLORIDE 0.9 % IV SOLN: 60.0000 mg | Freq: Once | INTRAVENOUS | Status: DC

## 2015-05-04 NOTE — Progress Notes (Addendum)
Patient ID: Connie Mccann, female   DOB: 15-Aug-1931, 81 y.o.   MRN: [id number]  [facility] PROGRESS NOTE  Connie Mccann [id number] DOB: 1931-05-11 DOA: 05/01/2015 PCP: Iran Planas, PA-C   Brief narrative:    Patient is 81 year old female with chronic diastolic CHF, grade 1 based on 2-D echo in 2015, presented to [facility] emergency department with main concern of several weeks duration off progressively worsening fatigue and weakness, poor oral intake, has been mostly bed bound per husband who is present at the bedside at the time of the admission. Patient explains that at baseline she is typically ambulatory and likes to move around but hasn't been able to do so in few weeks. She reports seeing her primary care provider week prior to this admission and was found to be anemic, she was instructed to take iron supplements which she did but her symptoms have not improved. Patient reports subjective fevers and chills at home, mixed episodes and productive and nonproductive cough, she feels like she has lost some weight but is unsure how much. She denies recent sick contacts or exposures, no recent traveling, no specific abdominal concerns, no specific urinary concerns. Patient also denies any specific focal neurological symptoms such as numbness, tingling, no visual changes or headaches, no changes in sensation.  In emergency department, vital signs stable, patient is hemodynamically stable, blood work notable for hemoglobin 11, creatinine 2.34, calcium greater than 15. Chest x-ray concerning for developing pneumonia. [facility] asked to admit to telemetry bed for further evaluation.  Assessment/Plan:    Active Problems: Generalized weakness - with current blood work results including intact PTH 9 (low) and Ca >14 (high), CT chest concerning for lymphoma, it is now apparent that hypercalcemia is most likely malignancy related - continue treating PNA, continue ABX for now day #3 - Encouraged  oral intake as patient able to tolerate - PT evaluation if pt able to toelrate   Community acquired pneumonia, unknown pathogen, retrocardiac area - Sputum analysis requested, culture, urine Legionella and strep pneumo, negative to date  - Continue Zithromax and Unasyn day #4/7 - IS while awake  Severe hypercalcemia - with current blood work results including intact PTH 9 (low) and Ca >144 (high), CT chest concerning for lymphoma, it is now apparent that hypercalcemia is most likely malignancy related - pt given one dose of Pamidronate 7/8 and Ca continues trending down - repeat BMP in AM  Pulmonary vascular congestion - from IVF that pt has received for treatment of hypercalcemia - stopped IVF 7/8 and place on Lasix IV - continue to monitor clinical response - monitor daily weights, strict I/O  History of extensive lymphadenopathy  - noted on CT abd and pelvis in August 2015 - CT chest done 7/8, results below, d/w family concern for lymphoma - d/w Dr. Lindi Adie, plan for IR consult for consideration of core biopsy of most accessible LN if possible - further recommendations pending LN biopsy   Acute metabolic encephalopathy - Likely secondary to community-acquired pneumonia, hypocalcemia, acute on chronic renal failure - Will need PT/OT evaluation once she feels more medically stable - more alert this AM  Acute on chronic renal failure, stage II - Based on GFR patient meets criteria for stage II chronic kidney failure - Baseline creatinine in the past 6 months has been 0.9-1.3 - Significantly higher on this admission and likely prerenal in etiology from poor oral intake and dehydration - holding losartan, Cr is somewhat better this AM - renal US with  no hydro and suggestive of chronic renal disease, so this could be simply new Cr baseline  - will continue to monitor  Anemia of chronic disease, microcytic, ? IDA vs chronic process from lymphadenopathy  - Recent anemia panel notable  for iron level 46, TIBC 152 - No signs of active bleeding - Repeat CBC in the morning  Moderate malnutrition - In the setting of ongoing illness outlined above - Nutritionist consult requested, recommendations appreciated   Hypokalemia - from Lasix - supplement and repeat BMP in AM  DVT prophylaxis - Lovenox SQ  Code Status: Full.  Family Communication:  plan of care discussed with the patient and family over 45 minutes total time spent discussing diagnostic studies and further plan of care  Disposition Plan: Home when stable vs SNF  IV access:  Peripheral IV  Procedures and diagnostic studies:    Dg Chest 2 View 05/01/2015   Retrocardiac atelectasis or pneumonia with probable small effusion. Followup PA and lateral chest X-ray is recommended in 3-4 weeks following trial of antibiotic therapy to ensure resolution.     Ct Chest Wo Contrast 05/03/2015  Interval progression of bilateral axillary adenopathy concerning for recurrent lymphoma. 2. New bilateral pleural effusions and diffuse subcutaneous edema suggesting congestive heart failure and anasarca. 3. New 7 mm left upper lobe pulmonary nodule. If the patient is at high risk for bronchogenic carcinoma, follow-up chest CT at 3-11months is recommended. If the patient is at low risk for bronchogenic carcinoma, follow-up chest CT at 6-12 months is recommended. This recommendation follows the consensus statement: Guidelines for Management of Small Pulmonary Nodules Detected on CT Scans:   US Renal 05/02/2015   No evidence of hydronephrosis. 2. Mildly increased renal parenchymal echogenicity raises concern for medical renal disease. 3. Small left renal cyst noted.      Medical Consultants:  Oncology over the phone   Other Consultants:  None  IAnti-Infectives:   Zithromax 7/6 --> Unasyn 7/6 -->  Faye Ramsay, MD  [facility] Pager [phone]  If 7PM-7AM, please contact night-coverage [email or web address] Password [email or web address] 05/04/2015, 2:07  PM   LOS: 3 days   HPI/Subjective: No events overnight.   Objective: Filed Vitals:   05/03/15 2100 05/03/15 2114 05/04/15 0547 05/04/15 0909  BP:  148/46 155/45   Pulse:  99 111   Temp:  98.9 F (37.2 C) 98.2 F (36.8 C)   TempSrc:  Oral Oral   Resp:  18 18   Height:      Weight:      SpO2: 93% 98% 98% 94%    Intake/Output Summary (Last 24 hours) at 05/04/15 1407 Last data filed at 05/04/15 0700  Gross per 24 hour  Intake 2477.92 ml  Output    900 ml  Net 1577.92 ml    Exam:   General:  Pt is more alert, follows commands appropriately, not in acute distress, tired and weak   Cardiovascular: Regular rhythm, tachycardic, S1/S2, no murmurs, no rubs, no gallops  Respiratory: Clear to auscultation bilaterally, no wheezing, diminished breath sounds at bases with bibasilar crackles   Abdomen: Soft, non tender, non distended, bowel sounds present, no guarding  Extremities: pulses DP and PT palpable bilaterally< +1 bilateral LE pitting edema   Neuro: Grossly nonfocal  Data Reviewed: Basic Metabolic Panel:  Recent Labs Lab 05/01/15 0606 05/01/15 0950 05/01/15 1313 05/02/15 0501 05/03/15 0451 05/04/15 0530  NA 134* 136  --  134* 137 137  K 4.6 4.1  --  3.7 3.8 3.3*  CL 101 105  --  103 107 106  CO2 29 28  --  28 24 25   GLUCOSE 122* 125*  --  97 89 98  BUN 39* 39*  --  37* 35* 37*  CREATININE 2.34* 2.10*  --  2.22* 2.13* 2.10*  CALCIUM >15.0* 14.4* 14.1* 13.8* 13.3* 12.8*  MG  --  1.8  --   --   --   --   PHOS  --  4.0  --   --   --   --    Liver Function Tests:  Recent Labs Lab 05/01/15 0606 05/01/15 0950  AST 21 16  ALT 13* 11*  ALKPHOS 51 48  BILITOT 0.5 0.6  PROT 7.4 7.0  ALBUMIN 2.6* 2.3*   CBC:  Recent Labs Lab 05/01/15 0606 05/01/15 0950 05/02/15 0501 05/03/15 0451 05/04/15 0530  WBC 4.9 4.3 4.6 4.2 4.2  NEUTROABS 2.6  --   --   --  2.2  HGB 11.1* 10.3* 11.0* 10.9* 10.5*  HCT 33.7* 31.2* 33.3* 33.1* 32.4*  MCV 76.4* 77.0* 76.6*  77.0* 76.2*  PLT 250 238 239 234 235     Recent Results (from the past 240 hour(s))  Culture, blood (routine x 2) Call MD if unable to obtain prior to antibiotics being given     Status: None (Preliminary result)   Collection Time: 05/01/15  9:50 AM  Result Value Ref Range Status   Specimen Description BLOOD RIGHT WRIST  Final   Special Requests BOTTLES DRAWN AEROBIC AND ANAEROBIC 5CC  Final   Culture   Final    NO GROWTH 3 DAYS Performed at [facility]    Report Status PENDING  Incomplete  Culture, blood (routine x 2) Call MD if unable to obtain prior to antibiotics being given     Status: None (Preliminary result)   Collection Time: 05/01/15 10:00 AM  Result Value Ref Range Status   Specimen Description BLOOD RIGHT ARM  Final   Special Requests BOTTLES DRAWN AEROBIC ONLY 6CC  Final   Culture   Final    NO GROWTH 3 DAYS Performed at [facility]    Report Status PENDING  Incomplete     Scheduled Meds: . ampicillin-sulbactam (UNASYN) IV  1.5 g Intravenous Q12H  . azithromycin  500 mg Intravenous Q24H  . budesonide (PULMICORT) nebulizer solution  0.25 mg Nebulization BID  . enoxaparin (LOVENOX) injection  30 mg Subcutaneous Q24H  . feeding supplement (ENSURE ENLIVE)  237 mL Oral BID BM  . ferrous sulfate  325 mg Oral BID WC  . fluticasone  2 spray Each Nare Daily  . furosemide  20 mg Intravenous BID   Continuous Infusions:

## 2015-05-04 NOTE — Progress Notes (Signed)
Peripherally Inserted Central Catheter/Midline Placement  The IV Nurse has discussed with the patient and/or persons authorized to consent for the patient, the purpose of this procedure and the potential benefits and risks involved with this procedure.  The benefits include less needle sticks, lab draws from the catheter and patient may be discharged home with the catheter.  Risks include, but not limited to, infection, bleeding, blood clot (thrombus formation), and puncture of an artery; nerve damage and irregular heat beat.  Alternatives to this procedure were also discussed.  Husband and family at bedside- all questions answered and consent signed by husband. PICC/Midline Placement Documentation  PICC / Midline Single Lumen 96/72/89 PICC Right Basilic 36 cm 0 cm (Active)  Indication for Insertion or Continuance of Line Poor Vasculature-patient has had multiple peripheral attempts or PIVs lasting less than 24 hours 05/04/2015 11:24 AM  Exposed Catheter (cm) 0 cm 05/04/2015 11:24 AM  Site Assessment Clean;Dry;Intact 05/04/2015 11:24 AM  Line Status Flushed;Saline locked;Blood return noted 05/04/2015 11:24 AM  Dressing Type Transparent 05/04/2015 11:24 AM  Dressing Status Clean;Dry;Intact;Antimicrobial disc in place 05/04/2015 11:24 AM  Line Care Connections checked and tightened 05/04/2015 11:24 AM  Dressing Intervention New dressing 05/04/2015 11:24 AM  Dressing Change Due 05/11/15 05/04/2015 11:24 AM       Rolena Infante 05/04/2015, 11:25 AM

## 2015-05-04 NOTE — Clinical Social Work Note (Signed)
CSW met with pt and her daughter at bedside to provide SNF bed options  CSW explained facilities that can accommodate pt for rehab and encouraged pt and family to ask any questions and or call with any over the weekend.  Pt's daughter stated that she would look over the list and speak with family to make a decision.  CSW will follow pt until discharge

## 2015-05-05 ENCOUNTER — Encounter (HOSPITAL_COMMUNITY): Payer: Self-pay | Admitting: Physician Assistant

## 2015-05-05 DIAGNOSIS — R591 Generalized enlarged lymph nodes: Secondary | ICD-10-CM

## 2015-05-05 LAB — BASIC METABOLIC PANEL
Anion gap: 5 (ref 5–15)
BUN: 36 mg/dL — AB (ref 6–20)
CALCIUM: 12.6 mg/dL — AB (ref 8.9–10.3)
CHLORIDE: 108 mmol/L (ref 101–111)
CO2: 27 mmol/L (ref 22–32)
Creatinine, Ser: 2.01 mg/dL — ABNORMAL HIGH (ref 0.44–1.00)
GFR calc non Af Amer: 22 mL/min — ABNORMAL LOW (ref >60)
GFR, EST AFRICAN AMERICAN: 26 mL/min — AB (ref >60)
Glucose, Bld: 100 mg/dL — ABNORMAL HIGH (ref 65–99)
Potassium: 3.2 mmol/L — ABNORMAL LOW (ref 3.5–5.1)
Sodium: 140 mmol/L (ref 135–145)

## 2015-05-05 LAB — CBC
HEMATOCRIT: 32.6 % — AB (ref 36.0–46.0)
HEMOGLOBIN: 10.5 g/dL — AB (ref 12.0–15.0)
MCH: 24.6 pg — ABNORMAL LOW (ref 26.0–34.0)
MCHC: 32.2 g/dL (ref 30.0–36.0)
MCV: 76.3 fL — ABNORMAL LOW (ref 78.0–100.0)
Platelets: 252 10*3/uL (ref 150–400)
RBC: 4.27 MIL/uL (ref 3.87–5.11)
RDW: 15.5 % (ref 11.5–15.5)
WBC: 3.8 10*3/uL — ABNORMAL LOW (ref 4.0–10.5)

## 2015-05-05 LAB — APTT: aPTT: 31 seconds (ref 24–37)

## 2015-05-05 LAB — PROTIME-INR
INR: 1.17 (ref 0.00–1.49)
PROTHROMBIN TIME: 15.1 s (ref 11.6–15.2)

## 2015-05-05 MED ORDER — AZITHROMYCIN 250 MG PO TABS
500.0000 mg | ORAL_TABLET | Freq: Every day | ORAL | Status: AC
  Administered 2015-05-05 – 2015-05-07 (×3): 500 mg via ORAL
  Filled 2015-05-05 (×3): qty 2

## 2015-05-05 MED ORDER — POTASSIUM CHLORIDE CRYS ER 20 MEQ PO TBCR
40.0000 meq | EXTENDED_RELEASE_TABLET | Freq: Once | ORAL | Status: AC
  Administered 2015-05-05: 40 meq via ORAL
  Filled 2015-05-05: qty 2

## 2015-05-05 NOTE — Progress Notes (Signed)
PHARMACIST - PHYSICIAN COMMUNICATION DR:   Doyle Askew CONCERNING: Antibiotic IV to Oral Route Change Policy  RECOMMENDATION: This patient is receiving zithromax by the intravenous route.  Based on criteria approved by the Pharmacy and Therapeutics Committee, the antibiotic(s) is/are being converted to the equivalent oral dose form(s).   DESCRIPTION: These criteria include:  Patient being treated for a respiratory tract infection, urinary tract infection, cellulitis or clostridium difficile associated diarrhea if on metronidazole  The patient is not neutropenic and does not exhibit a GI malabsorption state  The patient is eating (either orally or via tube) and/or has been taking other orally administered medications for a least 24 hours  The patient is improving clinically and has a Tmax < 100.5  If you have questions about this conversion, please contact the Pharmacy Department  []   [phone] )  [facility] []   [phone] )  [facility] []   [phone] )  [facility] []   [phone] )  [facility] [x]   [phone] )  [place], PharmD, [place] Pager [phone] 05/05/2015 9:23 AM

## 2015-05-05 NOTE — Evaluation (Signed)
Clinical/Bedside Swallow Evaluation Patient Details  Name: Connie Mccann MRN: [id number] Date of Birth: [birth year]  Today's Date: 05/05/2015 Time: SLP Start Time (ACUTE ONLY): 66 SLP Stop Time (ACUTE ONLY): 1637 SLP Time Calculation (min) (ACUTE ONLY): 27 min  Past Medical History:  Past Medical History  Diagnosis Date  . Hypertension   . Asthma    Past Surgical History:  Past Surgical History  Procedure Laterality Date  . Inguinal lymph node biopsy     HPI:  Patient is 81 year old female with chronic diastolic CHF, grade 1 based on 2-D echo in 2015, presented to  emergency department with main concern of several weeks duration off progressively worsening fatigue and weakness, poor oral intake, has been mostly bed bound. Chest x-ray concerning for developing pneumonia per MD. Report states New bilateral pleural effusions and diffuse subcutaneous edema suggesting congestive heart failure and anasarca and New 7 mm left upper lobe pulmonary nodule which MD states is concerning for Lymphoma.    Assessment / Plan / Recommendation Clinical Impression  Pt demonstrates a cognitive based oral dysphagia. Pt is automatic with thin liquids and is able to hold cup and sip without evidence of aspiration or oral holding. With puree and solid textures pt demonstrates prolonged oral holding. Several techniques found to be effective to an extent: following bites with sips, dry spoon, reducing distractions, self feeding with moderate tactile cues and asking a question to elict bolus transit and speech. Educated pts family with demonstration and verbal instruction. Expect function to improve as mentation clears. Recommend continuation of thin liquids with SLP treatment with trials of upgraded textures and ongoing teaching. Meds will be difficult to administer, suggest liquid meds or meds crushed in ice cream if needed.     Aspiration Risk  Mild    Diet Recommendation Thin   Medication  Administration: Crushed with puree (crushed with ice cream or liquid or alternative means) Compensations: Follow solids with liquid    Other  Recommendations Oral Care Recommendations: Oral care BID   Follow Up Recommendations       Frequency and Duration min 2x/week  2 weeks   Pertinent Vitals/Pain NA    SLP Swallow Goals     Swallow Study Prior Functional Status       General Other Pertinent Information: Patient is 81 year old female with chronic diastolic CHF, grade 1 based on 2-D echo in 2015, presented to [facility] emergency department with main concern of several weeks duration off progressively worsening fatigue and weakness, poor oral intake, has been mostly bed bound. Chest x-ray concerning for developing pneumonia per MD. Report states New bilateral pleural effusions and diffuse subcutaneous edema suggesting congestive heart failure and anasarca and New 7 mm left upper lobe pulmonary nodule which MD states is concerning for Lymphoma.  Type of Study: Bedside swallow evaluation Previous Swallow Assessment: none Diet Prior to this Study: Thin liquids Temperature Spikes Noted: No Respiratory Status: Room air History of Recent Intubation: No Behavior/Cognition: Alert;Requires cueing;Pleasant mood Oral Cavity - Dentition: Other (Comment) (partial, not in place) Self-Feeding Abilities: Able to feed self;Needs assist Patient Positioning: Upright in bed Baseline Vocal Quality: Normal Volitional Cough: Strong Volitional Swallow: Able to elicit    Oral/Motor/Sensory Function Overall Oral Motor/Sensory Function: Appears within functional limits for tasks assessed   Ice Chips     Thin Liquid Thin Liquid: Within functional limits Presentation: Cup;Straw    Nectar Thick Nectar Thick Liquid: Not tested   Honey Thick Honey Thick Liquid: Not tested  Puree Puree: Impaired Presentation: Spoon;Self Fed Oral Phase Functional Implications: Oral holding;Prolonged oral transit    Solid   GO    Solid: Impaired Presentation: Self Fed Oral Phase Functional Implications: Oral holding      Herbie [place], MA CCC-SLP [phone]  Lynann Beaver 05/05/2015,4:42 PM

## 2015-05-05 NOTE — Progress Notes (Addendum)
Patient ID: Connie Mccann, female   DOB: Jul 24, 1931, 81 y.o.   MRN: [id number]  [facility] PROGRESS NOTE  Connie Mccann XHB:[id number] DOB: 1931-03-12 DOA: 05/01/2015 PCP: Iran Planas, PA-C   Brief narrative:    Patient is 81 year old female with chronic diastolic CHF, grade 1 based on 2-D echo in 2015, presented to [facility] emergency department with main concern of several weeks duration off progressively worsening fatigue and weakness, poor oral intake, has been mostly bed bound per husband who is present at the bedside at the time of the admission. Patient explains that at baseline she is typically ambulatory and likes to move around but hasn't been able to do so in few weeks. She reports seeing her primary care provider week prior to this admission and was found to be anemic, she was instructed to take iron supplements which she did but her symptoms have not improved. Patient reports subjective fevers and chills at home, mixed episodes and productive and nonproductive cough, she feels like she has lost some weight but is unsure how much. She denies recent sick contacts or exposures, no recent traveling, no specific abdominal concerns, no specific urinary concerns. Patient also denies any specific focal neurological symptoms such as numbness, tingling, no visual changes or headaches, no changes in sensation.  In emergency department, vital signs stable, patient is hemodynamically stable, blood work notable for hemoglobin 11, creatinine 2.34, calcium greater than 15. Chest x-ray concerning for developing pneumonia. [facility] asked to admit to telemetry bed for further evaluation.  Assessment/Plan:    Active Problems: Generalized weakness - with current blood work results including intact PTH 9 (low) and Ca >14 (high), CT chest concerning for lymphoma, it is now apparent that hypercalcemia is most likely malignancy related - continue treating PNA, continue ABX for now day #5/7 - Encouraged  oral intake as patient able to tolerate - pt is more alert but intermittently confused and per family this has been sort of her baseline over the past 6-8 months  - PT evaluation if pt able to toelrate   Community acquired pneumonia, unknown pathogen, retrocardiac area - Sputum analysis requested, culture, urine Legionella and strep pneumo, negative to date  - Continue Zithromax and Unasyn day #5/7 - IS while awake  High risk aspiration - has been refusing to swallow tablets - keep on full liquid diet and request SLP  Severe hypercalcemia - with current blood work results including intact PTH 9 (low) and Ca >144 (high), CT chest concerning for lymphoma, it is now apparent that hypercalcemia is most likely malignancy related - pt given one dose of Pamidronate 7/8 and Ca continues slowly trending down - repeat BMP in AM  Pulmonary vascular congestion - from IVF that pt has received for treatment of hypercalcemia - stopped IVF 7/8 and place don Lasix IV - continue to monitor clinical response - weight remains stable at 64.9 kg - monitor daily weights, strict I/O  History of extensive lymphadenopathy  - noted on CT abd and pelvis in August 2015 - CT chest done 7/8, results below, d/w family concern for lymphoma - d/w Dr. Lindi Adie, IR consulted for consideration of LN biopsy, appreciate assistance  - further recommendations pending LN biopsy   Acute metabolic encephalopathy - Likely secondary to community-acquired pneumonia, hypocalcemia, acute on chronic renal failure and progressive failure to thrive  - Will need PT/OT evaluation once she feels more medically stable - more alert this AM  Acute on chronic renal failure, stage II -  Based on GFR patient meets criteria for stage II chronic kidney failure - Baseline creatinine in the past 6 months has been 0.9-1.3 - Significantly higher on this admission and likely prerenal in etiology from poor oral intake and dehydration - holding  losartan, Cr is trending down slightly  - renal US with no hydro and suggestive of chronic renal disease, so this could be simply new Cr baseline  - will continue to monitor  Anemia of chronic disease, microcytic, ? IDA vs chronic process from lymphadenopathy  - Recent anemia panel notable for iron level 46, TIBC 152 - No signs of active bleeding - Repeat CBC in the morning  Moderate malnutrition - In the setting of ongoing illness outlined above - Nutritionist consult requested, recommendations appreciated   Hypokalemia - from Lasix - supplement and repeat BMP in AM  Acute leukopenia - from acute on chronic illness - monitor closely   DVT prophylaxis - Lovenox SQ  Code Status: Full.  Family Communication:  plan of care discussed with the patient and family over 45 minutes total time spent discussing diagnostic studies and further plan of care  Disposition Plan: Home when stable vs SNF  IV access:  Peripheral IV  Procedures and diagnostic studies:    Dg Chest 2 View 05/01/2015   Retrocardiac atelectasis or pneumonia with probable small effusion. Followup PA and lateral chest X-ray is recommended in 3-4 weeks following trial of antibiotic therapy to ensure resolution.     Ct Chest Wo Contrast 05/03/2015  Interval progression of bilateral axillary adenopathy concerning for recurrent lymphoma. 2. New bilateral pleural effusions and diffuse subcutaneous edema suggesting congestive heart failure and anasarca. 3. New 7 mm left upper lobe pulmonary nodule. If the patient is at high risk for bronchogenic carcinoma, follow-up chest CT at 3-60months is recommended. If the patient is at low risk for bronchogenic carcinoma, follow-up chest CT at 6-12 months is recommended. This recommendation follows the consensus statement: Guidelines for Management of Small Pulmonary Nodules Detected on CT Scans:   US Renal 05/02/2015   No evidence of hydronephrosis. 2. Mildly increased renal parenchymal  echogenicity raises concern for medical renal disease. 3. Small left renal cyst noted.      Medical Consultants:  Oncology over the phone  IR  Other Consultants:  None  IAnti-Infectives:   Zithromax 7/6 --> Unasyn 7/6 -->  Faye Ramsay, MD  [facility] Pager [phone]  If 7PM-7AM, please contact night-coverage [email or web address] Password [email or web address] 05/05/2015, 2:45 PM   LOS: 4 days   HPI/Subjective: No events overnight.   Objective: Filed Vitals:   05/04/15 1936 05/04/15 2104 05/05/15 0527 05/05/15 0900  BP:  151/89 140/60   Pulse:  87 76   Temp:  98.1 F (36.7 C) 97.8 F (36.6 C)   TempSrc:  Oral Oral   Resp:  18 18   Height:      Weight:   64.905 kg (143 lb 1.4 oz)   SpO2: 94% 99% 98% 93%    Intake/Output Summary (Last 24 hours) at 05/05/15 1445 Last data filed at 05/05/15 1030  Gross per 24 hour  Intake    690 ml  Output    550 ml  Net    140 ml    Exam:   General:  Pt is more alert, follows commands appropriately, not in acute distress, tired and weak   Cardiovascular: Regular rhythm, tachycardic, S1/S2, no murmurs, no rubs, no gallops  Respiratory: Clear to auscultation bilaterally, no wheezing, diminished breath sounds at  bases with bibasilar crackles   Abdomen: Soft, non tender, non distended, bowel sounds present, no guarding  Extremities: pulses DP and PT palpable bilaterally, trace bilateral LE pitting edema   Neuro: Grossly nonfocal  Data Reviewed: Basic Metabolic Panel:  Recent Labs Lab 05/01/15 0950 05/01/15 1313 05/02/15 0501 05/03/15 0451 05/04/15 0530 05/05/15 0341  NA 136  --  134* 137 137 140  K 4.1  --  3.7 3.8 3.3* 3.2*  CL 105  --  103 107 106 108  CO2 28  --  28 24 25 27   GLUCOSE 125*  --  97 89 98 100*  BUN 39*  --  37* 35* 37* 36*  CREATININE 2.10*  --  2.22* 2.13* 2.10* 2.01*  CALCIUM 14.4* 14.1* 13.8* 13.3* 12.8* 12.6*  MG 1.8  --   --   --   --   --   PHOS 4.0  --   --   --   --   --    Liver Function  Tests:  Recent Labs Lab 05/01/15 0606 05/01/15 0950  AST 21 16  ALT 13* 11*  ALKPHOS 51 48  BILITOT 0.5 0.6  PROT 7.4 7.0  ALBUMIN 2.6* 2.3*   CBC:  Recent Labs Lab 05/01/15 0606 05/01/15 0950 05/02/15 0501 05/03/15 0451 05/04/15 0530 05/05/15 0341  WBC 4.9 4.3 4.6 4.2 4.2 3.8*  NEUTROABS 2.6  --   --   --  2.2  --   HGB 11.1* 10.3* 11.0* 10.9* 10.5* 10.5*  HCT 33.7* 31.2* 33.3* 33.1* 32.4* 32.6*  MCV 76.4* 77.0* 76.6* 77.0* 76.2* 76.3*  PLT 250 238 239 234 235 252     Recent Results (from the past 240 hour(s))  Culture, blood (routine x 2) Call MD if unable to obtain prior to antibiotics being given     Status: None (Preliminary result)   Collection Time: 05/01/15  9:50 AM  Result Value Ref Range Status   Specimen Description BLOOD RIGHT WRIST  Final   Special Requests BOTTLES DRAWN AEROBIC AND ANAEROBIC 5CC  Final   Culture   Final    NO GROWTH 3 DAYS Performed at [facility]    Report Status PENDING  Incomplete  Culture, blood (routine x 2) Call MD if unable to obtain prior to antibiotics being given     Status: None (Preliminary result)   Collection Time: 05/01/15 10:00 AM  Result Value Ref Range Status   Specimen Description BLOOD RIGHT ARM  Final   Special Requests BOTTLES DRAWN AEROBIC ONLY 6CC  Final   Culture   Final    NO GROWTH 3 DAYS Performed at [facility]    Report Status PENDING  Incomplete     Scheduled Meds: . ampicillin-sulbactam (UNASYN) IV  1.5 g Intravenous Q12H  . azithromycin  500 mg Oral Daily  . budesonide (PULMICORT) nebulizer solution  0.25 mg Nebulization BID  . enoxaparin (LOVENOX) injection  30 mg Subcutaneous Q24H  . feeding supplement (ENSURE ENLIVE)  237 mL Oral BID BM  . ferrous sulfate  325 mg Oral BID WC  . fluticasone  2 spray Each Nare Daily  . furosemide  20 mg Intravenous BID   Continuous Infusions:

## 2015-05-05 NOTE — H&P (Signed)
Chief Complaint: Chief Complaint  Patient presents with  . Altered Mental Status    Referring Physician(s): Connie Mccann  History of Present Illness:  Connie Mccann is a 81 y.o. female with CHF and h/o lymphadenopathy who was recently admitted for progressive weakness.  CT showed interval progression of bilateral axillary adenopathy concerning for lymphoma  She did have an inguinal lymph node biopsy about 6 months ago according to her husband and this was negative for malignancy.  She continued to have enlarging axillary lymphadenopathy and was referred to Connie Mccann for evaluation of excisional biopsy of an axillary lymph node.  It is unclear why the patient did not go forward with this referral.  The husband does not remember this.  We are now asked to evaluate patient for image guided lymph node biopsy.  Her husband is at the bedside and helps with medical history and ROS.  Past Medical History  Diagnosis Date  . Hypertension   . Asthma     Past Surgical History  Procedure Laterality Date  . Inguinal lymph node biopsy      Allergies: Review of patient's allergies indicates no known allergies.  Medications: Prior to Admission medications   Medication Sig Start Date End Date Taking? Authorizing Provider  albuterol (PROAIR HFA) 108 (90 BASE) MCG/ACT inhaler Inhale 2 puffs into the lungs every 4 (four) hours as needed for wheezing or shortness of breath. 02/01/15  Yes Connie L Breeback, PA-C  beclomethasone (QVAR) 80 MCG/ACT inhaler Inhale 2 puffs into the lungs 2 (two) times daily. 01/03/15  Yes Connie L Breeback, PA-C  cetirizine (ZYRTEC) 10 MG tablet Take 10 mg by mouth at bedtime.   Yes Historical Provider, MD  ferrous sulfate 325 (65 FE) MG EC tablet Take 1 tablet (325 mg total) by mouth 2 (two) times daily with a meal. 04/26/15  Yes Connie Hommel, DO  fluticasone (FLONASE) 50 MCG/ACT nasal spray Place 2 sprays into both nostrils daily.   Yes Historical Provider, MD    losartan (COZAAR) 50 MG tablet Take 1 tablet (50 mg total) by mouth daily. Take as needed for blood pressure  >150. 12/31/14  Yes Connie L Breeback, PA-C  Multiple Vitamin (MULTIVITAMIN) tablet Take 1 tablet by mouth daily.   Yes Historical Provider, MD  Multiple Vitamins-Minerals (OCUVITE EYE + MULTI) TABS Take 1 tablet by mouth daily.   Yes Historical Provider, MD  AMBULATORY NON FORMULARY MEDICATION Compression stocking for left leg to knee. (would prefer kind that zip on the medial leg) 30-30mmHG 12/31/14   Connie L Breeback, PA-C  furosemide (LASIX) 20 MG tablet Take 2 tablets (40 mg total) by mouth daily. Patient not taking: Reported on 05/01/2015 12/31/14   Donella Stade, PA-C     Family History  Problem Relation Age of Onset  . Alzheimer's disease Sister   . Stroke Maternal Aunt     History   Social History  . Marital Status: Married    Spouse Name: N/A  . Number of Children: N/A  . Years of Education: N/A   Social History Main Topics  . Smoking status: Never Smoker   . Smokeless tobacco: Not on file  . Alcohol Use: No  . Drug Use: No  . Sexual Activity: Not Currently   Other Topics Concern  . None   Social History Narrative     Review of Systems  Constitutional: Positive for activity change and fatigue. Negative for fever.  Respiratory: Negative for cough and  shortness of breath.   Cardiovascular: Negative for chest pain.  Gastrointestinal: Negative for nausea, vomiting and abdominal pain.  Musculoskeletal: Negative.   Skin: Negative.   Neurological: Negative.   Hematological: Positive for adenopathy.  Psychiatric/Behavioral: Negative.     Vital Signs: BP 140/60 mmHg  Pulse 76  Temp(Src) 97.8 F (36.6 C) (Oral)  Resp 18  Ht 5\' 3"  (1.6 m)  Wt 143 lb 1.4 oz (64.905 kg)  BMI 25.35 kg/m2  SpO2 93%  Physical Exam  Constitutional: She appears well-developed and well-nourished.  HENT:  Head: Normocephalic and atraumatic.  Eyes: EOM are normal.  Neck: Neck  supple. No thyromegaly present.  Cardiovascular: Normal rate and regular rhythm.   Murmur heard. Pulmonary/Chest: Effort normal and breath sounds normal. She has no wheezes.  Abdominal: Soft. Bowel sounds are normal. There is no tenderness.  Musculoskeletal:  +fatigue and weakness, patient can move all extremities   Neurological: She is alert.  Skin: Skin is warm and dry.  Psychiatric:  Patient appears groggy today, but does follow my commands.  Her husband is at bedside and answer my questions and helps with patients history and ROS.  He also signed consent for her.  Vitals reviewed.   Mallampati Score:  MD Evaluation Airway: WNL Heart: Other (comments) Heart  comments: + Murmur Abdomen: WNL Chest/ Lungs: WNL ASA  Classification: 3 Mallampati/Airway Score: Two  Imaging: Dg Chest 2 View  05/01/2015   CLINICAL DATA:  Fatigue.  Loss of appetite.  EXAM: CHEST  2 VIEW  COMPARISON:  10/20/2014  FINDINGS: There is a retrocardiac opacity which was not seen previously. The opacity obscures the left diaphragm. There is likely a small associated pleural effusion. Associated lucency is likely residual aerated lung.  Borderline cardiomegaly.  Negative aortic and hilar contours.  IMPRESSION: Retrocardiac atelectasis or pneumonia with probable small effusion. Followup PA and lateral chest X-ray is recommended in 3-4 weeks following trial of antibiotic therapy to ensure resolution.   Electronically Signed   By: Connie Mccann M.D.   On: 05/01/2015 07:00   Ct Chest Wo Contrast  05/03/2015   CLINICAL DATA:  Shortness of breath. Pleural effusion. Asthma. Lethargy.  EXAM: CT CHEST WITHOUT CONTRAST  TECHNIQUE: Multidetector CT imaging of the chest was performed following the standard protocol without IV contrast.  COMPARISON:  CT chest 10/20/2014  FINDINGS: The heart is enlarged. Coronary artery calcifications are present. Prevascular lymph nodes are slightly more prominent than on the prior study. There is  is increase in size of bilateral axillary lymph nodes. The largest right-sided node measures 15 x 24 x 30 mm. The largest left-sided node is 16 x 24 mm. A right supraclavicular node is increased in size from 14 day 18 mm in short axis.  Moderate bilateral pleural effusions are present. Bilateral hilar adenopathy is evident. The  There is atelectasis associated with the effusions bilaterally. A 7 mm left upper lobe pulmonary nodule is new. No other focal nodule is present.  The bone windows are unremarkable. There is moderate edema within the subcutaneous soft tissues of the chest.  Limited imaging of the upper abdomen demonstrates a focal lesion in the posterior right liver most compatible with a hemangioma.  IMPRESSION: 1. Interval progression of bilateral axillary adenopathy concerning for recurrent lymphoma. 2. New bilateral pleural effusions and diffuse subcutaneous edema suggesting congestive heart failure and anasarca. 3. New 7 mm left upper lobe pulmonary nodule. If the patient is at high risk for bronchogenic carcinoma, follow-up chest CT at  3-33months is recommended. If the patient is at low risk for bronchogenic carcinoma, follow-up chest CT at 6-12 months is recommended. This recommendation follows the consensus statement: Guidelines for Management of Small Pulmonary Nodules Detected on CT Scans: A Statement from the [employer] as published in Radiology 2005; 237:395-400. 4. Stable cardiomegaly and coronary artery disease.   Electronically Signed   By: San Morelle M.D.   On: 05/03/2015 17:21   US Renal  05/02/2015   CLINICAL DATA:  Acute onset of renal failure.  Initial encounter.  EXAM: RENAL / URINARY TRACT ULTRASOUND COMPLETE  COMPARISON:  CT of the abdomen and pelvis performed 10/20/2014  FINDINGS: Right Kidney:  Length: 8.9 cm. Mildly increased parenchymal echogenicity noted. No mass or hydronephrosis visualized.  Left Kidney:  Length: 9.9 cm. Mildly increased parenchymal  echogenicity noted. No hydronephrosis visualized. A 1.6 cm cyst is noted at the interpole region of the left kidney.  Bladder:  Not characterized on this study.  IMPRESSION: 1. No evidence of hydronephrosis. 2. Mildly increased renal parenchymal echogenicity raises concern for medical renal disease. 3. Small left renal cyst noted.   Electronically Signed   By: Garald Balding M.D.   On: 05/02/2015 21:30    Labs:  CBC:  Recent Labs  05/02/15 0501 05/03/15 0451 05/04/15 0530 05/05/15 0341  WBC 4.6 4.2 4.2 3.8*  HGB 11.0* 10.9* 10.5* 10.5*  HCT 33.3* 33.1* 32.4* 32.6*  PLT 239 234 235 252    COAGS: No results for input(s): INR, APTT in the last 8760 hours.  BMP:  Recent Labs  05/02/15 0501 05/03/15 0451 05/04/15 0530 05/05/15 0341  NA 134* 137 137 140  K 3.7 3.8 3.3* 3.2*  CL 103 107 106 108  CO2 28 24 25 27   GLUCOSE 97 89 98 100*  BUN 37* 35* 37* 36*  CALCIUM 13.8* 13.3* 12.8* 12.6*  CREATININE 2.22* 2.13* 2.10* 2.01*  GFRNONAA 20* 21* 21* 22*  GFRAA 23* 24* 24* 26*    LIVER FUNCTION TESTS:  Recent Labs  10/21/14 0143 11/14/14 1312 05/01/15 0606 05/01/15 0950  BILITOT 0.4 0.36 0.5 0.6  AST 17 17 21 16   ALT 10 11 13* 11*  ALKPHOS 49 57 51 48  PROT 6.8 8.4* 7.4 7.0  ALBUMIN 2.8* 3.1* 2.6* 2.3*    TUMOR MARKERS: No results for input(s): AFPTM, CEA, CA199, CHROMGRNA in the last 8760 hours.  Assessment and Plan:  Axillary lymphadenopathy.   History of inguinal lymph node biopsy which was negative for malignancy.  Chart and labs reviewed.  Also reviewed by Dr. Barbie Banner and lymph node is ameenable for image guided biopsy.  Since Dr. Alen Blew had previously referred to Connie Mccann for excisional biopsy, we will contact Dr. Alen Blew tomorrow prior to proceeding with image guided biopsy and make sure this is the best course of action for Ms. Quiroa.    Risks and Benefits discussed with the patient and her husband including, but not limited to bleeding, infection, damage  to adjacent structures or low yield requiring additional tests. All of the patient's and husband's questions were answered, both are agreeable to proceed. Consent signed and in chart.   Thank you for this interesting consult.  I greatly enjoyed meeting Alexiss R Heinze and look forward to participating in their care.  Signed: Murrell Redden PA-C 05/05/2015, 10:21 AM   I spent a total of 40 Minutes   in face to face in clinical consultation, greater than 50% of which was counseling/coordinating care for  image guided lymph node biopsy.

## 2015-05-05 NOTE — Progress Notes (Signed)
Pt is not swallowing oral meds:holds in roof of mouth and refuses to swallow.  May have gotten half of oral zithromax.   MD made aware.  Will continue to monitor closely.

## 2015-05-06 ENCOUNTER — Encounter (HOSPITAL_COMMUNITY)
Admission: EM | Disposition: A | Discharge status: Skilled Nursing Facility | Payer: Self-pay | Source: Home / Self Care | Attending: Internal Medicine

## 2015-05-06 ENCOUNTER — Encounter (HOSPITAL_COMMUNITY): Payer: Self-pay | Admitting: General Surgery

## 2015-05-06 ENCOUNTER — Inpatient Hospital Stay (HOSPITAL_COMMUNITY): Payer: Medicare Other | Admitting: Anesthesiology

## 2015-05-06 DIAGNOSIS — N179 Acute kidney failure, unspecified: Secondary | ICD-10-CM

## 2015-05-06 DIAGNOSIS — R4182 Altered mental status, unspecified: Secondary | ICD-10-CM

## 2015-05-06 DIAGNOSIS — I509 Heart failure, unspecified: Secondary | ICD-10-CM

## 2015-05-06 DIAGNOSIS — R590 Localized enlarged lymph nodes: Secondary | ICD-10-CM

## 2015-05-06 DIAGNOSIS — K769 Liver disease, unspecified: Secondary | ICD-10-CM | POA: Diagnosis present

## 2015-05-06 DIAGNOSIS — N289 Disorder of kidney and ureter, unspecified: Secondary | ICD-10-CM

## 2015-05-06 DIAGNOSIS — D649 Anemia, unspecified: Secondary | ICD-10-CM

## 2015-05-06 HISTORY — PX: LYMPH NODE BIOPSY: SHX201

## 2015-05-06 LAB — PTH, INTACT AND CALCIUM
Calcium, Total (PTH): 14.1 mg/dL (ref 8.7–10.3)
PTH: 9 pg/mL — AB (ref 15–65)

## 2015-05-06 LAB — BASIC METABOLIC PANEL
ANION GAP: 5 (ref 5–15)
BUN: 41 mg/dL — ABNORMAL HIGH (ref 6–20)
CALCIUM: 13 mg/dL — AB (ref 8.9–10.3)
CO2: 27 mmol/L (ref 22–32)
Chloride: 106 mmol/L (ref 101–111)
Creatinine, Ser: 2.01 mg/dL — ABNORMAL HIGH (ref 0.44–1.00)
GFR, EST AFRICAN AMERICAN: 26 mL/min — AB (ref >60)
GFR, EST NON AFRICAN AMERICAN: 22 mL/min — AB (ref >60)
Glucose, Bld: 122 mg/dL — ABNORMAL HIGH (ref 65–99)
POTASSIUM: 3.5 mmol/L (ref 3.5–5.1)
Sodium: 138 mmol/L (ref 135–145)

## 2015-05-06 LAB — CULTURE, BLOOD (ROUTINE X 2)
Culture: NO GROWTH
Culture: NO GROWTH

## 2015-05-06 LAB — SURGICAL PCR SCREEN
MRSA, PCR: NEGATIVE
Staphylococcus aureus: NEGATIVE

## 2015-05-06 LAB — CBC
HEMATOCRIT: 32.3 % — AB (ref 36.0–46.0)
HEMOGLOBIN: 10.6 g/dL — AB (ref 12.0–15.0)
MCH: 25.2 pg — AB (ref 26.0–34.0)
MCHC: 32.8 g/dL (ref 30.0–36.0)
MCV: 76.7 fL — ABNORMAL LOW (ref 78.0–100.0)
PLATELETS: 235 10*3/uL (ref 150–400)
RBC: 4.21 MIL/uL (ref 3.87–5.11)
RDW: 15.7 % — AB (ref 11.5–15.5)
WBC: 4.2 10*3/uL (ref 4.0–10.5)

## 2015-05-06 SURGERY — LYMPH NODE BIOPSY
Anesthesia: General | Site: Axilla | Laterality: Right

## 2015-05-06 MED ORDER — BUPIVACAINE-EPINEPHRINE 0.25% -1:200000 IJ SOLN
INTRAMUSCULAR | Status: DC | PRN
  Administered 2015-05-06: 10 mL

## 2015-05-06 MED ORDER — FUROSEMIDE 10 MG/ML IJ SOLN
20.0000 mg | Freq: Every day | INTRAMUSCULAR | Status: DC
  Administered 2015-05-07 – 2015-05-09 (×3): 20 mg via INTRAVENOUS
  Filled 2015-05-06 (×3): qty 2

## 2015-05-06 MED ORDER — SODIUM CHLORIDE 0.9 % IV SOLN
INTRAVENOUS | Status: DC | PRN
  Administered 2015-05-06: 14:00:00 via INTRAVENOUS

## 2015-05-06 MED ORDER — FENTANYL CITRATE (PF) 100 MCG/2ML IJ SOLN
INTRAMUSCULAR | Status: DC | PRN
  Administered 2015-05-06 (×3): 25 ug via INTRAVENOUS

## 2015-05-06 MED ORDER — FENTANYL CITRATE (PF) 100 MCG/2ML IJ SOLN
INTRAMUSCULAR | Status: AC
  Filled 2015-05-06: qty 2

## 2015-05-06 MED ORDER — FENTANYL CITRATE (PF) 100 MCG/2ML IJ SOLN: 25.0000 ug | INTRAMUSCULAR | Status: DC | PRN

## 2015-05-06 MED ORDER — DEXAMETHASONE SODIUM PHOSPHATE 10 MG/ML IJ SOLN
INTRAMUSCULAR | Status: AC
  Filled 2015-05-06: qty 1

## 2015-05-06 MED ORDER — OXYCODONE HCL 5 MG PO TABS: 5.0000 mg | ORAL_TABLET | Freq: Once | ORAL | Status: DC | PRN

## 2015-05-06 MED ORDER — CEFAZOLIN SODIUM-DEXTROSE 2-3 GM-% IV SOLR: 2.0000 g | INTRAVENOUS | Status: DC

## 2015-05-06 MED ORDER — ALBUMIN HUMAN 5 % IV SOLN
INTRAVENOUS | Status: AC
  Filled 2015-05-06: qty 250

## 2015-05-06 MED ORDER — ESMOLOL HCL 10 MG/ML IV SOLN
INTRAVENOUS | Status: AC
  Filled 2015-05-06: qty 10

## 2015-05-06 MED ORDER — PHENYLEPHRINE 40 MCG/ML (10ML) SYRINGE FOR IV PUSH (FOR BLOOD PRESSURE SUPPORT)
PREFILLED_SYRINGE | INTRAVENOUS | Status: AC
  Filled 2015-05-06: qty 10

## 2015-05-06 MED ORDER — LIDOCAINE HCL (CARDIAC) 20 MG/ML IV SOLN
INTRAVENOUS | Status: DC | PRN
  Administered 2015-05-06: 50 mg via INTRAVENOUS

## 2015-05-06 MED ORDER — CEFAZOLIN SODIUM-DEXTROSE 2-3 GM-% IV SOLR
INTRAVENOUS | Status: AC
  Filled 2015-05-06: qty 50

## 2015-05-06 MED ORDER — PHENYLEPHRINE HCL 10 MG/ML IJ SOLN
INTRAMUSCULAR | Status: DC | PRN
  Administered 2015-05-06 (×3): 80 ug via INTRAVENOUS

## 2015-05-06 MED ORDER — PROPOFOL 10 MG/ML IV BOLUS
INTRAVENOUS | Status: AC
  Filled 2015-05-06: qty 20

## 2015-05-06 MED ORDER — BUPIVACAINE-EPINEPHRINE 0.25% -1:200000 IJ SOLN
INTRAMUSCULAR | Status: AC
  Filled 2015-05-06: qty 1

## 2015-05-06 MED ORDER — ALBUMIN HUMAN 5 % IV SOLN
INTRAVENOUS | Status: DC | PRN
  Administered 2015-05-06: 15:00:00 via INTRAVENOUS

## 2015-05-06 MED ORDER — OXYCODONE HCL 5 MG/5ML PO SOLN
5.0000 mg | Freq: Once | ORAL | Status: DC | PRN
  Filled 2015-05-06: qty 5

## 2015-05-06 MED ORDER — PROPOFOL 10 MG/ML IV BOLUS
INTRAVENOUS | Status: DC | PRN
  Administered 2015-05-06: 100 mg via INTRAVENOUS

## 2015-05-06 MED ORDER — ONDANSETRON HCL 4 MG/2ML IJ SOLN: 4.0000 mg | Freq: Four times a day (QID) | INTRAMUSCULAR | Status: DC | PRN

## 2015-05-06 MED ORDER — SODIUM CHLORIDE 0.9 % IV SOLN
INTRAVENOUS | Status: DC
  Administered 2015-05-06: 17:00:00 via INTRAVENOUS

## 2015-05-06 MED ORDER — SODIUM CHLORIDE 0.9 % IV SOLN: INTRAVENOUS | Status: DC

## 2015-05-06 MED ORDER — 0.9 % SODIUM CHLORIDE (POUR BTL) OPTIME
TOPICAL | Status: DC | PRN
  Administered 2015-05-06: 1000 mL

## 2015-05-06 MED ORDER — ESMOLOL HCL 10 MG/ML IV SOLN
INTRAVENOUS | Status: DC | PRN
  Administered 2015-05-06: 20 mg via INTRAVENOUS

## 2015-05-06 MED ORDER — LIDOCAINE HCL (CARDIAC) 20 MG/ML IV SOLN
INTRAVENOUS | Status: AC
  Filled 2015-05-06: qty 5

## 2015-05-06 SURGICAL SUPPLY — 38 items
APPLIER CLIP 9.375 MED OPEN (MISCELLANEOUS) ×2
BANDAGE ELASTIC 6 VELCRO ST LF (GAUZE/BANDAGES/DRESSINGS) IMPLANT
BENZOIN TINCTURE PRP APPL 2/3 (GAUZE/BANDAGES/DRESSINGS) IMPLANT
BLADE HEX COATED 2.75 (ELECTRODE) ×2 IMPLANT
BLADE SURG 15 STRL LF DISP TIS (BLADE) ×1 IMPLANT
BLADE SURG 15 STRL SS (BLADE) ×1
CLIP APPLIE 9.375 MED OPEN (MISCELLANEOUS) ×1 IMPLANT
COVER SURGICAL LIGHT HANDLE (MISCELLANEOUS) ×2 IMPLANT
DECANTER SPIKE VIAL GLASS SM (MISCELLANEOUS) ×2 IMPLANT
DERMABOND ADVANCED (GAUZE/BANDAGES/DRESSINGS) ×1
DERMABOND ADVANCED .7 DNX12 (GAUZE/BANDAGES/DRESSINGS) ×1 IMPLANT
DRAPE LAPAROSCOPIC ABDOMINAL (DRAPES) ×2 IMPLANT
DRSG TEGADERM 4X4.75 (GAUZE/BANDAGES/DRESSINGS) IMPLANT
ELECT REM PT RETURN 9FT ADLT (ELECTROSURGICAL) ×2
ELECTRODE REM PT RTRN 9FT ADLT (ELECTROSURGICAL) ×1 IMPLANT
GAUZE SPONGE 4X4 12PLY STRL (GAUZE/BANDAGES/DRESSINGS) IMPLANT
GAUZE SPONGE 4X4 16PLY XRAY LF (GAUZE/BANDAGES/DRESSINGS) ×2 IMPLANT
GLOVE BIO SURGEON STRL SZ7 (GLOVE) ×2 IMPLANT
GLOVE BIOGEL PI IND STRL 7.0 (GLOVE) IMPLANT
GLOVE BIOGEL PI IND STRL 7.5 (GLOVE) ×1 IMPLANT
GLOVE BIOGEL PI INDICATOR 7.0 (GLOVE)
GLOVE BIOGEL PI INDICATOR 7.5 (GLOVE) ×1
GOWN SPEC L4 XLG W/TWL (GOWN DISPOSABLE) ×2 IMPLANT
GOWN STRL REUS W/TWL LRG LVL3 (GOWN DISPOSABLE) IMPLANT
GOWN STRL REUS W/TWL XL LVL3 (GOWN DISPOSABLE) IMPLANT
KIT BASIN OR (CUSTOM PROCEDURE TRAY) ×2 IMPLANT
MARKER SKIN DUAL TIP RULER LAB (MISCELLANEOUS) ×2 IMPLANT
NEEDLE HYPO 22GX1.5 SAFETY (NEEDLE) IMPLANT
NEEDLE HYPO 25X1 1.5 SAFETY (NEEDLE) ×2 IMPLANT
NS IRRIG 1000ML POUR BTL (IV SOLUTION) ×2 IMPLANT
PACK BASIC VI WITH GOWN DISP (CUSTOM PROCEDURE TRAY) ×2 IMPLANT
PENCIL BUTTON HOLSTER BLD 10FT (ELECTRODE) ×2 IMPLANT
STRIP CLOSURE SKIN 1/2X4 (GAUZE/BANDAGES/DRESSINGS) IMPLANT
SUT VIC AB 3-0 SH 27 (SUTURE) ×1
SUT VIC AB 3-0 SH 27XBRD (SUTURE) ×1 IMPLANT
SYR BULB IRRIGATION 50ML (SYRINGE) ×2 IMPLANT
SYR CONTROL 10ML LL (SYRINGE) ×2 IMPLANT
YANKAUER SUCT BULB TIP 10FT TU (MISCELLANEOUS) ×2 IMPLANT

## 2015-05-06 NOTE — Anesthesia Preprocedure Evaluation (Addendum)
Anesthesia Evaluation  Patient identified by MRN, date of birth, ID band Patient awake    Reviewed: Allergy & Precautions, NPO status , Patient's Chart, lab work & pertinent test results  Airway Mallampati: II   Neck ROM: full    Dental   Pulmonary asthma , pneumonia -,  breath sounds clear to auscultation        Cardiovascular hypertension, Rhythm:regular Rate:Normal     Neuro/Psych    GI/Hepatic GERD-  ,  Endo/Other  Hypothyroidism   Renal/GU ARFRenal disease     Musculoskeletal   Abdominal   Peds  Hematology Pt possibly has lymphoma.  Ca+ levels elevated probably from malignancy.   Anesthesia Other Findings   Reproductive/Obstetrics                            Anesthesia Physical Anesthesia Plan  ASA: III  Anesthesia Plan: General   Post-op Pain Management:    Induction: Intravenous  Airway Management Planned: LMA  Additional Equipment:   Intra-op Plan:   Post-operative Plan:   Informed Consent: I have reviewed the patients History and Physical, chart, labs and discussed the procedure including the risks, benefits and alternatives for the proposed anesthesia with the patient or authorized representative who has indicated his/her understanding and acceptance.     Plan Discussed with: Anesthesiologist, CRNA and Surgeon  Anesthesia Plan Comments:         Anesthesia Quick Evaluation

## 2015-05-06 NOTE — Consult Note (Addendum)
Connie Mccann   DOB:[birth year]   TD#:[id number]   [id number]    Hematology/oncology inpatient  consult note   referring physician: Dr. Mart Piggs   reason for consult:  Diffuse adenopathy  HPI: Patient was previously seen by my partner Dr. Alen Blew, who is out office this week and I'm covering for him. I reviewed patient's chart extensively.   She was initially referred to [place] for diffuse adenopathy in 05/2014. She underwent inguinal node biopsy on 07/05/2014 which was negative for malignancy. She was last seen in our cancer center in January 2016, Dr.Shadad recommended axillary node biopsy due to the progression of adenopathy, but her surgeon disagreed with repeated biopsy due to the first negative biopsy result, per patient's daughter. She did not follow-up afterwards.  I saw patient today in the recovery room after her right axillary lymph node biopsy. Patient was still sedated, not able to come indicate with me. I spoke with her husband and 2 daughters afterwards.  She presented with worsening fatigue, shortness of breath on exertion, anorexia for the past few weeks, and progressive mental status change and lethargy. She was admitted to the hospital on July 6 for hypercalcemia. Per her daughters, she was previously very active and pretty healthy.     Past Medical History  Diagnosis Date  . Hypertension   . Asthma     Objective:  Filed Vitals:   05/06/15 1311  BP: 156/48  Pulse: 107  Temp: 97.8 F (36.6 C)  Resp:     Body mass index is 24.1 kg/(m^2).  Intake/Output Summary (Last 24 hours) at 05/06/15 1433 Last data filed at 05/05/15 1900  Gross per 24 hour  Intake    240 ml  Output    300 ml  Net    -60 ml     Sclerae unicteric  Oropharynx clear  (+) diffuse peripheral adenopathy at left Parkline, b/l axillar, and left inguinal   Lungs clear -- no rales or rhonchi  Heart regular rate and rhythm  Abdomen benign  MSK no focal spinal tenderness, no peripheral edema  Skin:  Diffuse skin rash from neck to the whole chest, and shoulders   Breast exam: No palpable mass   CBG (last 3)  No results for input(s): GLUCAP in the last 72 hours.   Labs:  Lab Results  Component Value Date   WBC 4.2 05/06/2015   HGB 10.6* 05/06/2015   HCT 32.3* 05/06/2015   MCV 76.7* 05/06/2015   PLT 235 05/06/2015   NEUTROABS 2.2 05/04/2015    @LASTCHEMISTRY @  Urine Studies No results for input(s): UHGB, CRYS in the last 72 hours.  Invalid input(s): UACOL, UAPR, USPG, UPH, UTP, UGL, UKET, UBIL, UNIT, UROB, [place], UEPI, UWBC, Duwayne Heck [place], [place]  Basic Metabolic Panel:  Recent Labs Lab 05/01/15 0950  05/02/15 0501 05/03/15 0451 05/04/15 0530 05/05/15 0341 05/06/15 0356  NA 136  --  134* 137 137 140 138  K 4.1  --  3.7 3.8 3.3* 3.2* 3.5  CL 105  --  103 107 106 108 106  CO2 28  --  28 24 25 27 27   GLUCOSE 125*  --  97 89 98 100* 122*  BUN 39*  --  37* 35* 37* 36* 41*  CREATININE 2.10*  --  2.22* 2.13* 2.10* 2.01* 2.01*  CALCIUM 14.4*  < > 13.8* 13.3* 12.8* 12.6* 13.0*  MG 1.8  --   --   --   --   --   --  PHOS 4.0  --   --   --   --   --   --   < > = values in this interval not displayed. GFR Estimated Creatinine Clearance: 18.2 mL/min (by C-G formula based on Cr of 2.01). Liver Function Tests:  Recent Labs Lab 05/01/15 0606 05/01/15 0950  AST 21 16  ALT 13* 11*  ALKPHOS 51 48  BILITOT 0.5 0.6  PROT 7.4 7.0  ALBUMIN 2.6* 2.3*   No results for input(s): LIPASE, AMYLASE in the last 168 hours. No results for input(s): AMMONIA in the last 168 hours. Coagulation profile  Recent Labs Lab 05/05/15 1019  INR 1.17    CBC:  Recent Labs Lab 05/01/15 0606  05/02/15 0501 05/03/15 0451 05/04/15 0530 05/05/15 0341 05/06/15 0356  WBC 4.9  < > 4.6 4.2 4.2 3.8* 4.2  NEUTROABS 2.6  --   --   --  2.2  --   --   HGB 11.1*  < > 11.0* 10.9* 10.5* 10.5* 10.6*  HCT 33.7*  < > 33.3* 33.1* 32.4* 32.6* 32.3*  MCV 76.4*  < > 76.6* 77.0* 76.2* 76.3*  76.7*  PLT 250  < > 239 234 235 252 235  < > = values in this interval not displayed. Cardiac Enzymes: No results for input(s): CKTOTAL, CKMB, CKMBINDEX, TROPONINI in the last 168 hours. BNP: Invalid input(s): POCBNP CBG: No results for input(s): GLUCAP in the last 168 hours. D-Dimer No results for input(s): DDIMER in the last 72 hours. Hgb A1c No results for input(s): HGBA1C in the last 72 hours. Lipid Profile No results for input(s): CHOL, HDL, LDLCALC, TRIG, CHOLHDL, LDLDIRECT in the last 72 hours. Thyroid function studies No results for input(s): TSH, T4TOTAL, T3FREE, THYROIDAB in the last 72 hours.  Invalid input(s): FREET3 Anemia work up No results for input(s): VITAMINB12, FOLATE, FERRITIN, TIBC, IRON, RETICCTPCT in the last 72 hours. Microbiology Recent Results (from the past 240 hour(s))  Culture, blood (routine x 2) Call MD if unable to obtain prior to antibiotics being given     Status: None   Collection Time: 05/01/15  9:50 AM  Result Value Ref Range Status   Specimen Description BLOOD RIGHT WRIST  Final   Special Requests BOTTLES DRAWN AEROBIC AND ANAEROBIC 5CC  Final   Culture   Final    NO GROWTH 5 DAYS Performed at [facility]    Report Status 05/06/2015 FINAL  Final  Culture, blood (routine x 2) Call MD if unable to obtain prior to antibiotics being given     Status: None   Collection Time: 05/01/15 10:00 AM  Result Value Ref Range Status   Specimen Description BLOOD RIGHT ARM  Final   Special Requests BOTTLES DRAWN AEROBIC ONLY [place]  Final   Culture   Final    NO GROWTH 5 DAYS Performed at [facility]    Report Status 05/06/2015 FINAL  Final  Surgical pcr screen     Status: None   Collection Time: 05/06/15  1:09 PM  Result Value Ref Range Status   MRSA, PCR NEGATIVE NEGATIVE Final   Staphylococcus aureus NEGATIVE NEGATIVE Final    Comment:        The Xpert SA Assay (FDA approved for NASAL specimens in patients over 21 years of  age), is one component of a comprehensive surveillance program.  Test performance has been validated by [facility] for patients greater than or equal to 18 year old. It is not intended  to diagnose infection nor to guide or monitor treatment.       Studies:  CT chest 05/03/2015  IMPRESSION: 1. Interval progression of bilateral axillary adenopathy concerning for recurrent lymphoma. 2. New bilateral pleural effusions and diffuse subcutaneous edema suggesting congestive heart failure and anasarca. 3. New 7 mm left upper lobe pulmonary nodule. If the patient is at high risk for bronchogenic carcinoma, follow-up chest CT at 3-5months is recommended. If the patient is at low risk for bronchogenic carcinoma, follow-up chest CT at 6-12 months is recommended. This recommendation follows the consensus statement: Guidelines for Management of Small Pulmonary Nodules Detected on CT Scans: A Statement from the [employer] as published in Radiology 2005; 237:395-400. 4. Stable cardiomegaly and coronary artery disease.  Assessment: 81 y.o.  with past medical history of CHF, otherwise healthy and physically active, presented with diffuse adenopathy since August 2015, previous inguinal biopsy was negative for malignancy. PET scan in 06/14/2014 showed mild hypermetabolic adenopathy in the neck, chest, abdomen and pelvis, and a 6 cm low attenuation lesion in posterior right hepatic lobe, with only minimal metabolic activity in the peripheral of the lesion.   1. Diffuse adenopathy, suspicious for low-grade lymphoma vs other indolent malignancy, benign etiology is also possible such as sarcoidosis, giving her hypercalcemia  2. Hypercalcemia 3. Non-hypermetabolic liver lesion, unknown etiology  4. Anemia 5. AKI 4. CHF  5. Altered mental status likely secondary to hypercalcemia   Plan:  1. She is status post right axillary node surgical biopsy this afternoon, I'll follow-up with pathology 2.  Will determine if we need abdominal MRI to further evaluate her liver lesion, based on her condition and lymph node biopsy results 3. SPEP was negative, unlikely MM 4. Please check ferritin, her iron study on 6/30 revealed normal serum iron, low TIBC, this is likely c/w anemia of chronic disease   5. For her persistent hypercalcemia,  -may consider calcitonin and repeat diuretics in addition to IV fluids. Given her history of CHF and anasarca, pleural effusion, we need to be cautious about fluid balance. -She has received pamidronate, no need to repeat it in 3-4 weeks. Alternatively if she has persistent hypercalcemia, we may consider denosumab in a week also  -If she has persistent mental status change due to hypercalcemia and renal failure, may consider renal consult  I will follow up. Thanks for the opportunity to participate in this patient's care  Truitt Merle, MD 05/06/2015  2:33 PM

## 2015-05-06 NOTE — Progress Notes (Signed)
SLP Cancellation Note  Patient Details Name: Connie Mccann MRN: [id number] DOB: 1930/11/02   Cancelled treatment:       Reason Eval/Treat Not Completed: Other (comment) (pt NPO in anticipaton of biopsy, will defer SLP tx til 7/12)   Luanna Salk, [place] [facility] [phone]

## 2015-05-06 NOTE — Progress Notes (Signed)
Pt lethargic in [place] still, 1h out from OR. Pt arouses to voice, but just moans in response, pt will not speak. Pt intermittently follows commands, turns head/opens eyes only. Pt moves all extremities withdraws to pain. MD Hodierne notified. This is pt baseline, as in preop. VSS. Biopsy incision c,d,i. Will return to pt room.

## 2015-05-06 NOTE — Progress Notes (Signed)
Patient ID: Connie Mccann, female   DOB: 1931-08-28, 81 y.o.   MRN: [id number]  [facility] PROGRESS NOTE  Connie Mccann ZSW:[id number] DOB: 08/28/1931 DOA: 05/01/2015 PCP: Iran Planas, PA-C   Brief narrative:    Patient is 81 year old female with chronic diastolic CHF, grade 1 based on 2-D echo in 2015, presented to [facility] emergency department with main concern of several weeks duration off progressively worsening fatigue and weakness, poor oral intake, has been mostly bed bound per husband who is present at the bedside at the time of the admission. Patient explains that at baseline she is typically ambulatory and likes to move around but hasn't been able to do so in few weeks. She reports seeing her primary care provider week prior to this admission and was found to be anemic, she was instructed to take iron supplements which she did but her symptoms have not improved. Patient reports subjective fevers and chills at home, mixed episodes and productive and nonproductive cough, she feels like she has lost some weight but is unsure how much. She denies recent sick contacts or exposures, no recent traveling, no specific abdominal concerns, no specific urinary concerns. Patient also denies any specific focal neurological symptoms such as numbness, tingling, no visual changes or headaches, no changes in sensation.  In emergency department, vital signs stable, patient is hemodynamically stable, blood work notable for hemoglobin 11, creatinine 2.34, calcium greater than 15. Chest x-ray concerning for developing pneumonia. TRH asked to admit to telemetry bed for further evaluation.  Assessment/Plan:    Active Problems: Generalized weakness - with current blood work results including intact PTH 9 (low) and Ca >144 (high), CT chest concerning for lymphoma, it is now apparent that hypercalcemia is most likely malignancy related - continue treating PNA, continue ABX for now day #6/7 - Encouraged  oral intake as patient able to tolerate - pt is more alert but intermittently confused and per family this has been sort of her baseline over the past 6-8 months   Community acquired pneumonia, unknown pathogen, retrocardiac area - Sputum analysis requested, culture, urine Legionella and strep pneumo, negative to date  - Continue Zithromax and Unasyn day #6/7 - IS while awake  High risk aspiration - has been refusing to swallow tablets - SLP requested, pt is NPO for now in an anticipation of biopsy   Severe hypercalcemia - with current blood work results including intact PTH 9 (low) and Ca >144 (high), CT chest concerning for lymphoma, it is now apparent that hypercalcemia is most likely malignancy related - pt given one dose of Pamidronate 7/8 and Ca overall better but still elevated  - continue Lasix but will lower the dose as weight is down from 64 to 61 kg - will place on IVF as well to help with hypercalcemia  - repeat BMP in AM  Pulmonary vascular congestion - from IVF that pt has received for treatment of hypercalcemia - continue to monitor clinical response - weight trending down with lasix from 64 to 61kg - monitor daily weights, strict I/O  History of extensive lymphadenopathy  - noted on CT abd and pelvis in August 2015 - CT chest done 7/8, results below, d/w family concern for lymphoma - d/w Dr. Lindi Adie, IR consulted for consideration of LN biopsy - d/w Dr. Burr Medico, will nee surgery consult for excisional biopsy as soon as possible, surgery team consulted   Acute metabolic encephalopathy - Likely secondary to community-acquired pneumonia, hypocalcemia, acute on chronic renal failure and  progressive failure to thrive  - Will need PT/OT evaluation once she feels more medically stable - more alert this AM  Acute on chronic renal failure, stage II - Based on GFR patient meets criteria for stage II chronic kidney failure - Baseline creatinine in the past 6 months has been  0.9-1.3 - Significantly higher on this admission and likely prerenal in etiology from poor oral intake and dehydration - holding losartan, Cr remain stable ~2 - renal US with no hydro and suggestive of chronic renal disease, so this could be simply new Cr baseline  - will continue to monitor  Anemia of chronic disease, microcytic, ? IDA vs chronic process from lymphadenopathy  - Recent anemia panel notable for iron level 46, TIBC 152 - No signs of active bleeding - Repeat CBC in the morning  Moderate malnutrition - In the setting of ongoing illness outlined above - Nutritionist consult requested, recommendations appreciated   Hypokalemia - from Lasix - supplemented and WNL this AM  Acute leukopenia - from acute on chronic illness, WBC is WNL this AM  - monitor closely   DVT prophylaxis - Lovenox SQ  Code Status: Full.  Family Communication:  plan of care discussed with the patient and family over 45 minutes total time spent discussing diagnostic studies and further plan of care  Disposition Plan: Home when stable vs SNF  IV access:  Peripheral IV  Procedures and diagnostic studies:    Dg Chest 2 View 05/01/2015   Retrocardiac atelectasis or pneumonia with probable small effusion. Followup PA and lateral chest X-ray is recommended in 3-4 weeks following trial of antibiotic therapy to ensure resolution.     Ct Chest Wo Contrast 05/03/2015  Interval progression of bilateral axillary adenopathy concerning for recurrent lymphoma. 2. New bilateral pleural effusions and diffuse subcutaneous edema suggesting congestive heart failure and anasarca. 3. New 7 mm left upper lobe pulmonary nodule. If the patient is at high risk for bronchogenic carcinoma, follow-up chest CT at 3-52months is recommended. If the patient is at low risk for bronchogenic carcinoma, follow-up chest CT at 6-12 months is recommended. This recommendation follows the consensus statement: Guidelines for Management of Small  Pulmonary Nodules Detected on CT Scans:   US Renal 05/02/2015   No evidence of hydronephrosis. 2. Mildly increased renal parenchymal echogenicity raises concern for medical renal disease. 3. Small left renal cyst noted.     Medical Consultants:  Oncology IR Surgery   Other Consultants:  None  IAnti-Infectives:   Zithromax 7/6 --> Unasyn 7/6 -->  Faye Ramsay, MD  [facility] Pager [phone]  If 7PM-7AM, please contact night-coverage [email or web address] Password [facility] 05/06/2015, 11:53 AM   LOS: 5 days   HPI/Subjective: No events overnight.   Objective: Filed Vitals:   05/05/15 1953 05/05/15 2103 05/06/15 0404 05/06/15 0827  BP:  136/50 132/55   Pulse:  101 95   Temp:  97.8 F (36.6 C) 97.4 F (36.3 C)   TempSrc:  Oral Oral   Resp:  20 20   Height:      Weight:   61.689 kg (136 lb)   SpO2: 95% 100% 100% 100%    Intake/Output Summary (Last 24 hours) at 05/06/15 1153 Last data filed at 05/05/15 1900  Gross per 24 hour  Intake    240 ml  Output    300 ml  Net    -60 ml    Exam:   General:  Pt is more alert, follows commands appropriately, not in acute distress  Cardiovascular: Regular rhythm, tachycardic, S1/S2, no murmurs, no rubs, no gallops  Respiratory: Clear to auscultation bilaterally, no wheezing, diminished breath sounds at bases   Abdomen: Soft, non tender, non distended, bowel sounds present, no guarding  Extremities: pulses DP and PT palpable bilaterally, trace bilateral LE pitting edema   Neuro: Grossly nonfocal  Data Reviewed: Basic Metabolic Panel:  Recent Labs Lab 05/01/15 0950  05/02/15 0501 05/03/15 0451 05/04/15 0530 05/05/15 0341 05/06/15 0356  NA 136  --  134* 137 137 140 138  K 4.1  --  3.7 3.8 3.3* 3.2* 3.5  CL 105  --  103 107 106 108 106  CO2 28  --  28 24 25 27 27   GLUCOSE 125*  --  97 89 98 100* 122*  BUN 39*  --  37* 35* 37* 36* 41*  CREATININE 2.10*  --  2.22* 2.13* 2.10* 2.01* 2.01*  CALCIUM 14.4*  < > 13.8* 13.3*  12.8* 12.6* 13.0*  MG 1.8  --   --   --   --   --   --   PHOS 4.0  --   --   --   --   --   --   < > = values in this interval not displayed. Liver Function Tests:  Recent Labs Lab 05/01/15 0606 05/01/15 0950  AST 21 16  ALT 13* 11*  ALKPHOS 51 48  BILITOT 0.5 0.6  PROT 7.4 7.0  ALBUMIN 2.6* 2.3*   CBC:  Recent Labs Lab 05/01/15 0606  05/02/15 0501 05/03/15 0451 05/04/15 0530 05/05/15 0341 05/06/15 0356  WBC 4.9  < > 4.6 4.2 4.2 3.8* 4.2  NEUTROABS 2.6  --   --   --  2.2  --   --   HGB 11.1*  < > 11.0* 10.9* 10.5* 10.5* 10.6*  HCT 33.7*  < > 33.3* 33.1* 32.4* 32.6* 32.3*  MCV 76.4*  < > 76.6* 77.0* 76.2* 76.3* 76.7*  PLT 250  < > 239 234 235 252 235  < > = values in this interval not displayed.   Recent Results (from the past 240 hour(s))  Culture, blood (routine x 2) Call MD if unable to obtain prior to antibiotics being given     Status: None (Preliminary result)   Collection Time: 05/01/15  9:50 AM  Result Value Ref Range Status   Specimen Description BLOOD RIGHT WRIST  Final   Special Requests BOTTLES DRAWN AEROBIC AND ANAEROBIC 5CC  Final   Culture   Final    NO GROWTH 4 DAYS Performed at [facility]    Report Status PENDING  Incomplete  Culture, blood (routine x 2) Call MD if unable to obtain prior to antibiotics being given     Status: None (Preliminary result)   Collection Time: 05/01/15 10:00 AM  Result Value Ref Range Status   Specimen Description BLOOD RIGHT ARM  Final   Special Requests BOTTLES DRAWN AEROBIC ONLY [place]  Final   Culture   Final    NO GROWTH 4 DAYS Performed at [facility]    Report Status PENDING  Incomplete     Scheduled Meds: . ampicillin-sulbactam (UNASYN) IV  1.5 g Intravenous Q12H  . azithromycin  500 mg Oral Daily  . budesonide (PULMICORT) nebulizer solution  0.25 mg Nebulization BID  . enoxaparin (LOVENOX) injection  30 mg Subcutaneous Q24H  . feeding supplement (ENSURE ENLIVE)  237 mL Oral BID BM  .  ferrous sulfate  325  mg Oral BID WC  . fluticasone  2 spray Each Nare Daily  . furosemide  20 mg Intravenous BID   Continuous Infusions:

## 2015-05-06 NOTE — Progress Notes (Signed)
CSW continuing to follow.  CSW attempted to follow up with pt daughter regarding decision for SNF. Pt currently out of room for a procedure and no family present.   CSW to follow up at a later time regarding decision for SNF as pt not yet medically ready for discharge today.  CSW to continue to follow to provide support and assist with pt disposition planning.  Alison Murray, MSW, [place] [employer] [phone]

## 2015-05-06 NOTE — Anesthesia Postprocedure Evaluation (Signed)
Anesthesia Post Note  Patient: Connie Mccann  Procedure(s) Performed: Procedure(s) (LRB): RIGHT AXILLARY LYMPH NODE BIOPsy (Right)  Anesthesia type: General  Patient location: PACU  Post pain: Pain level controlled and Adequate analgesia  Post assessment: Post-op Vital signs reviewed, Patient's Cardiovascular Status Stable, Respiratory Function Stable, Patent Airway and Pain level controlled  Last Vitals:  Filed Vitals:   05/06/15 1530  BP: 129/50  Pulse: 92  Temp: 36.5 C  Resp: 12    Post vital signs: Reviewed and stable  Level of consciousness: awake, alert  and oriented  Complications: No apparent anesthesia complications

## 2015-05-06 NOTE — Op Note (Signed)
Surgeon: Kaylyn Lim, MD, FACS  Asst:  none  Anes:  general  Procedure: Right axillary lymph node biopsy  Diagnosis: Path pending-sent fresh for touch preps and lymphoma workup  Complications: none  EBL:   minimal cc  Drains: none  Description of Procedure:  The patient was taken to OR 1 at [facility].  After anesthesia was administered and the patient was prepped a timeout was performed.  The right axilla was palpated and a large node marked.  An incision was made in the skin crease and the axilla entered.  The node was localized and dissected away from the surrounding tissue using clips to control potential bleeding.  When it was removed, it was sent fresh to pathology.  The incision was injected with lidocaine and closed with 4-0 vicryl and Liquiban.    The patient tolerated the procedure well and was taken to the PACU in stable condition.     Matt B. Hassell Done, [place], [facility], [place]

## 2015-05-06 NOTE — Transfer of Care (Signed)
Immediate Anesthesia Transfer of Care Note  Patient: Connie Mccann  Procedure(s) Performed: Procedure(s): RIGHT AXILLARY LYMPH NODE BIOPsy (Right)  Patient Location: PACU  Anesthesia Type:General  Level of Consciousness: sedated  Airway & Oxygen Therapy: Patient Spontanous Breathing and Patient connected to face mask oxygen  Post-op Assessment: Report given to RN and Post -op Vital signs reviewed and stable  Post vital signs: Reviewed and stable  Last Vitals:  Filed Vitals:   05/06/15 1311  BP: 156/48  Pulse: 107  Temp: 36.6 C  Resp:     Complications: No apparent anesthesia complications

## 2015-05-06 NOTE — Anesthesia Procedure Notes (Signed)
Procedure Name: LMA Insertion Date/Time: 05/06/2015 2:28 PM Performed by: Danley Danker L Patient Re-evaluated:Patient Re-evaluated prior to inductionOxygen Delivery Method: Circle system utilized Preoxygenation: Pre-oxygenation with 100% oxygen Intubation Type: IV induction Ventilation: Mask ventilation without difficulty LMA: LMA inserted LMA Size: 3.0 Number of attempts: 1 Placement Confirmation: positive ETCO2 and breath sounds checked- equal and bilateral Dental Injury: Teeth and Oropharynx as per pre-operative assessment

## 2015-05-06 NOTE — Consult Note (Signed)
Reason for Consult: lymphadenopathy  Referring Physician: Dr. Mart Piggs  HPI: Connie Mccann was admitted with weakness and malaise.  She has known diffuse lymphadenopathy incidentally found on CT scan.  She had a inguinal lymph node biopsy August 2015 which showed hyperplasia.  She has been followed by Dr. Alen Blew who recommended excisional biopsy of right axillary lymph node.  She was seen by Dr. Marlou Starks in February but its unclear as to why it was not done.   Her husband is at bedside and answered questions.  The patient was sleepy and answered some questions but difficult to evaluate.  Denies significant weight loss.  H&H are down but stable.  sCr 2 at baseline.  She has been NPO since midnight and last dose of lovenox last night.   Past Medical History  Diagnosis Date  . Hypertension   . Asthma     Past Surgical History  Procedure Laterality Date  . Inguinal lymph node biopsy      Family History  Problem Relation Age of Onset  . Alzheimer's disease Sister   . Stroke Maternal Aunt     Social History:  reports that she has never smoked. She does not have any smokeless tobacco history on file. She reports that she does not drink alcohol or use illicit drugs.  Allergies: No Known Allergies  Medications:  No current facility-administered medications on file prior to encounter.   Current Outpatient Prescriptions on File Prior to Encounter  Medication Sig Dispense Refill  . albuterol (PROAIR HFA) 108 (90 BASE) MCG/ACT inhaler Inhale 2 puffs into the lungs every 4 (four) hours as needed for wheezing or shortness of breath. 1 Inhaler 11  . beclomethasone (QVAR) 80 MCG/ACT inhaler Inhale 2 puffs into the lungs 2 (two) times daily. 1 Inhaler 12  . ferrous sulfate 325 (65 FE) MG EC tablet Take 1 tablet (325 mg total) by mouth 2 (two) times daily with a meal.  3  . fluticasone (FLONASE) 50 MCG/ACT nasal spray Place 2 sprays into both nostrils daily.    Marland Kitchen losartan (COZAAR) 50 MG tablet Take  1 tablet (50 mg total) by mouth daily. Take as needed for blood pressure  >150. 90 tablet 1  . Multiple Vitamin (MULTIVITAMIN) tablet Take 1 tablet by mouth daily.    . Multiple Vitamins-Minerals (OCUVITE EYE + MULTI) TABS Take 1 tablet by mouth daily.    . AMBULATORY NON FORMULARY MEDICATION Compression stocking for left leg to knee. (would prefer kind that zip on the medial leg) 30-46mHG 1 Device 0  . furosemide (LASIX) 20 MG tablet Take 2 tablets (40 mg total) by mouth daily. (Patient not taking: Reported on 05/01/2015) 30 tablet 5     Results for orders placed or performed during the hospital encounter of 05/01/15 (from the past 48 hour(s))  CBC     Status: Abnormal   Collection Time: 05/05/15  3:41 AM  Result Value Ref Range   WBC 3.8 (L) 4.0 - 10.5 K/uL   RBC 4.27 3.87 - 5.11 MIL/uL   Hemoglobin 10.5 (L) 12.0 - 15.0 g/dL   HCT 32.6 (L) 36.0 - 46.0 %   MCV 76.3 (L) 78.0 - 100.0 fL   MCH 24.6 (L) 26.0 - 34.0 pg   MCHC 32.2 30.0 - 36.0 g/dL   RDW 15.5 11.5 - 15.5 %   Platelets 252 150 - 400 K/uL  Basic metabolic panel     Status: Abnormal   Collection Time: 05/05/15  3:41 AM  Result  Value Ref Range   Sodium 140 135 - 145 mmol/L   Potassium 3.2 (L) 3.5 - 5.1 mmol/L   Chloride 108 101 - 111 mmol/L   CO2 27 22 - 32 mmol/L   Glucose, Bld 100 (H) 65 - 99 mg/dL   BUN 36 (H) 6 - 20 mg/dL   Creatinine, Ser 2.01 (H) 0.44 - 1.00 mg/dL   Calcium 12.6 (H) 8.9 - 10.3 mg/dL   GFR calc non Af Amer 22 (L) >60 mL/min   GFR calc Af Amer 26 (L) >60 mL/min    Comment: (NOTE) The eGFR has been calculated using the CKD EPI equation. This calculation has not been validated in all clinical situations. eGFR's persistently <60 mL/min signify possible Chronic Kidney Disease.    Anion gap 5 5 - 15  Protime-INR     Status: None   Collection Time: 05/05/15 10:19 AM  Result Value Ref Range   Prothrombin Time 15.1 11.6 - 15.2 seconds   INR 1.17 0.00 - 1.49  APTT     Status: None   Collection Time:  05/05/15 10:19 AM  Result Value Ref Range   aPTT 31 24 - 37 seconds  CBC     Status: Abnormal   Collection Time: 05/06/15  3:56 AM  Result Value Ref Range   WBC 4.2 4.0 - 10.5 K/uL   RBC 4.21 3.87 - 5.11 MIL/uL   Hemoglobin 10.6 (L) 12.0 - 15.0 g/dL   HCT 32.3 (L) 36.0 - 46.0 %   MCV 76.7 (L) 78.0 - 100.0 fL   MCH 25.2 (L) 26.0 - 34.0 pg   MCHC 32.8 30.0 - 36.0 g/dL   RDW 15.7 (H) 11.5 - 15.5 %   Platelets 235 150 - 400 K/uL  Basic metabolic panel     Status: Abnormal   Collection Time: 05/06/15  3:56 AM  Result Value Ref Range   Sodium 138 135 - 145 mmol/L   Potassium 3.5 3.5 - 5.1 mmol/L   Chloride 106 101 - 111 mmol/L   CO2 27 22 - 32 mmol/L   Glucose, Bld 122 (H) 65 - 99 mg/dL   BUN 41 (H) 6 - 20 mg/dL   Creatinine, Ser 2.01 (H) 0.44 - 1.00 mg/dL   Calcium 13.0 (H) 8.9 - 10.3 mg/dL   GFR calc non Af Amer 22 (L) >60 mL/min   GFR calc Af Amer 26 (L) >60 mL/min    Comment: (NOTE) The eGFR has been calculated using the CKD EPI equation. This calculation has not been validated in all clinical situations. eGFR's persistently <60 mL/min signify possible Chronic Kidney Disease.    Anion gap 5 5 - 15    No results found.  Review of Systems  All other systems reviewed and are negative.  Blood pressure 132/55, pulse 95, temperature 97.4 F (36.3 C), temperature source Oral, resp. rate 20, height 5' 3"  (1.6 m), weight 61.689 kg (136 lb), SpO2 100 %. Physical Exam  Constitutional: She is oriented to person, place, and time. She appears well-developed and well-nourished. No distress.  Cardiovascular: Normal rate, regular rhythm, normal heart sounds and intact distal pulses.  Exam reveals no gallop and no friction rub.   No murmur heard. Respiratory: Effort normal and breath sounds normal.  GI: Soft. Bowel sounds are normal. She exhibits no distension and no mass. There is no tenderness. There is no rebound and no guarding.  Lymphadenopathy:    She has axillary adenopathy.  Right: Inguinal adenopathy present.  Neurological: She is alert and oriented to person, place, and time.  Skin: Skin is warm and dry. She is not diaphoretic.    Assessment/Plan: Lymphadenopathy-concerns for lymphoma. right axillary lymph node biopsy this afternoon. Ancef on call to OR.  I discussed surgical risks with the patients husband including but not limited to infection, bleeding, nerve injury, lymphedema, poor wound healing and anesthesia risks.  The patient verbalizes understanding and wishes to proceed.   Taraoluwa Thakur ANP-BC 05/06/2015, 12:50 PM

## 2015-05-07 ENCOUNTER — Encounter (HOSPITAL_COMMUNITY): Payer: Self-pay | Admitting: Surgery

## 2015-05-07 DIAGNOSIS — K7689 Other specified diseases of liver: Secondary | ICD-10-CM

## 2015-05-07 LAB — BASIC METABOLIC PANEL
ANION GAP: 6 (ref 5–15)
BUN: 42 mg/dL — ABNORMAL HIGH (ref 6–20)
CALCIUM: 11.5 mg/dL — AB (ref 8.9–10.3)
CO2: 27 mmol/L (ref 22–32)
CREATININE: 2.01 mg/dL — AB (ref 0.44–1.00)
Chloride: 110 mmol/L (ref 101–111)
GFR, EST AFRICAN AMERICAN: 26 mL/min — AB (ref >60)
GFR, EST NON AFRICAN AMERICAN: 22 mL/min — AB (ref >60)
Glucose, Bld: 91 mg/dL (ref 65–99)
Potassium: 3.2 mmol/L — ABNORMAL LOW (ref 3.5–5.1)
SODIUM: 143 mmol/L (ref 135–145)

## 2015-05-07 LAB — CBC
HEMATOCRIT: 31.5 % — AB (ref 36.0–46.0)
Hemoglobin: 9.9 g/dL — ABNORMAL LOW (ref 12.0–15.0)
MCH: 24.1 pg — ABNORMAL LOW (ref 26.0–34.0)
MCHC: 31.4 g/dL (ref 30.0–36.0)
MCV: 76.6 fL — ABNORMAL LOW (ref 78.0–100.0)
Platelets: 191 10*3/uL (ref 150–400)
RBC: 4.11 MIL/uL (ref 3.87–5.11)
RDW: 15.9 % — AB (ref 11.5–15.5)
WBC: 3.8 10*3/uL — AB (ref 4.0–10.5)

## 2015-05-07 LAB — KAPPA/LAMBDA LIGHT CHAINS
KAPPA FREE LGHT CHN: 391.84 mg/L — AB (ref 3.30–19.40)
Kappa, lambda light chain ratio: 1.2 (ref 0.26–1.65)
Lambda free light chains: 326.79 mg/L — ABNORMAL HIGH (ref 5.71–26.30)

## 2015-05-07 MED ORDER — POTASSIUM CHLORIDE CRYS ER 20 MEQ PO TBCR
40.0000 meq | EXTENDED_RELEASE_TABLET | Freq: Once | ORAL | Status: AC
  Administered 2015-05-07: 40 meq via ORAL
  Filled 2015-05-07: qty 2

## 2015-05-07 NOTE — Progress Notes (Signed)
CSW continuing to follow for disposition planning.   CSW followed up with pt, pt husband, and pt daughter at bedside. Pt family was provided SNF bed offers over the weekend. However, pt had right axillary node surgical biopsy this afternoon and results are currently pending. Per MD note, pt with Diffuse adenopathy, suspicious for low-grade lymphoma vs other indolent malignancy, benign etiology is also possible such as sarcoidosis, giving her hypercalcemia and biopsy results will assist in determining plan of care.   Per pt husband, pt family has not yet made decision regarding SNF. CSW discussed with pt and pt family that it will be important to review bed offers given, but CSW will likely have to re-initiate SNF search once biopsy results available and plan of care in place because depending on results and plan then bed offers may change. Pt and pt family expressed understanding.   Pt in good spirits at this time and has strong support from pt family.  CSW to continue to follow to provide support and assist with pt disposition plan.  Alison Murray, MSW, [place] [facility] [phone]

## 2015-05-07 NOTE — Progress Notes (Signed)
Physical Therapy Treatment Patient Details Name: Connie Mccann MRN: [id number] DOB: 1931-06-09 Today's Date: 05-19-15    History of Present Illness 81 year old female with hx of chronic diastolic CHF and HTN admitted for generalized weakness and CAP. Pt s/p Right lymph node biopsy 7/11    PT Comments    Pt ambulated short distance in hallway and requiring min assist at this time.  Continue to recommend SNF upon d/c.  Follow Up Recommendations  SNF;Supervision/Assistance - 24 hour     Equipment Recommendations  Rolling walker with 5" wheels    Recommendations for Other Services       Precautions / Restrictions Precautions Precautions: Fall    Mobility  Bed Mobility Overal bed mobility: Needs Assistance Bed Mobility: Supine to Sit     Supine to sit: Min assist     General bed mobility comments: verbal cues for technique, assist for trunk upright   Transfers Overall transfer level: Needs assistance Equipment used: Rolling walker (2 wheeled) Transfers: Sit to/from Stand Sit to Stand: Min assist;From elevated surface         General transfer comment: verbal cues for safe technique, assist to rise and steady  Ambulation/Gait Ambulation/Gait assistance: Min assist Ambulation Distance (Feet): 40 Feet Assistive device: Rolling walker (2 wheeled) Gait Pattern/deviations: Step-through pattern;Narrow base of support;Trunk flexed;Decreased stride length     General Gait Details: verbal cues for step length, RW positioning, posture, requiring assist to steady and maneuver RW, distance per pt tolerance   Stairs            Wheelchair Mobility    Modified Rankin (Stroke Patients Only)       Balance                                    Cognition Arousal/Alertness: Awake/alert Behavior During Therapy: WFL for tasks assessed/performed Overall Cognitive Status: Within Functional Limits for tasks assessed                       Exercises      General Comments        Pertinent Vitals/Pain Pain Assessment: No/denies pain    Home Living                      Prior Function            PT Goals (current goals can now be found in the care plan section) Progress towards PT goals: Progressing toward goals    Frequency  Min 3X/week    PT Plan Current plan remains appropriate    Co-evaluation             End of Session Equipment Utilized During Treatment: Gait belt Activity Tolerance: Patient limited by fatigue Patient left: with call bell/phone within reach;with family/visitor present;with chair alarm set;in chair     Time: 3832-9191 PT Time Calculation (min) (ACUTE ONLY): 14 min  Charges:  $Gait Training: 8-22 mins                    G Codes:      Tam Delisle,KATHrine E May 19, 2015, 11:57 AM Carmelia Bake, PT, DPT May 19, 2015 Pager: [phone]

## 2015-05-07 NOTE — Progress Notes (Signed)
Patient ID: Connie Mccann, female   DOB: January 26, 1931, 81 y.o.   MRN: [id number]  [facility] PROGRESS NOTE  Connie Mccann ZSW:[id number] DOB: 1931-03-21 DOA: 05/01/2015 PCP: Iran Planas, PA-C   Brief narrative:    Patient is 81 year old female with chronic diastolic CHF, grade 1 based on 2-D echo in 2015, presented [facility] department with main concern of several weeks duration off progressively worsening fatigue and weakness, poor oral intake, has been mostly bed bound per husband who is present at the bedside at the time of the admission. Patient explains that at baseline she is typically ambulatory and likes to move around but hasn't been able to do so in few weeks. She reports seeing her primary care provider week prior to this admission and was found to be anemic, she was instructed to take iron supplements which she did but her symptoms have not improved. Patient reports subjective fevers and chills at home, mixed episodes and productive and nonproductive cough, she feels like she has lost some weight but is unsure how much. She denies recent sick contacts or exposures, no recent traveling, no specific abdominal concerns, no specific urinary concerns. Patient also denies any specific focal neurological symptoms such as numbness, tingling, no visual changes or headaches, no changes in sensation.  In emergency department, vital signs stable, patient is hemodynamically stable, blood work notable for hemoglobin 11, creatinine 2.34, calcium greater than 15. Chest x-ray concerning for developing pneumonia. [facility] asked to admit to telemetry bed for further evaluation.  Assessment/Plan:    Active Problems: Generalized weakness - with current blood work results including intact PTH 9 (low) and Ca >144 (high), CT chest concerning for lymphoma, it is now apparent that hypercalcemia is most likely malignancy related - continue treating PNA, continue ABX for now day #7/7 - Encouraged  oral intake as patient able to tolerate - pt is more alert this AM  Community acquired pneumonia, unknown pathogen, retrocardiac area - Sputum analysis requested, culture, urine Legionella and strep pneumo, negative to date  - Continue Zithromax and Unasyn day #7/7 - IS while awake  High risk aspiration - has been refusing to swallow tablets - improving overall   Severe hypercalcemia - with current blood work results including intact PTH 9 (low) and Ca >144 (high), CT chest concerning for lymphoma, it is now apparent that hypercalcemia is most likely malignancy related - pt given one dose of Pamidronate 7/8 and Ca is trending down  - continue Lasix but will lower the dose, weight down from 64 to 61 kg 7/11 but after biopsy has received some fluids and weight up to 64 kg this AM - repeat BMP in AM  Pulmonary vascular congestion - from IVF that pt has received for treatment of hypercalcemia - continue to monitor clinical response  - monitor daily weights, strict I/O  History of extensive lymphadenopathy  - noted on CT abd and pelvis in August 2015 - CT chest done 7/8, results below, d/w family concern for lymphoma - d/w Dr. Lindi Adie, IR consulted for consideration of LN biopsy - d/w Dr. Burr Medico, pt is now s/p LN biopsy, awaiting for path report   Acute metabolic encephalopathy - Likely secondary to community-acquired pneumonia, hypocalcemia, acute on chronic renal failure and progressive failure to thrive  - Will need PT/OT evaluation once she feels more medically stable - more alert this AM  Acute on chronic renal failure, stage II - Based on GFR patient meets criteria for stage II chronic  kidney failure - Baseline creatinine in the past 6 months has been 0.9-1.3 - Significantly higher on this admission and likely prerenal in etiology from poor oral intake and dehydration - holding losartan, Cr remain stable ~2 - renal US with no hydro and suggestive of chronic renal disease, so this  could be simply new Cr baseline  - will continue to monitor  Anemia of chronic disease, microcytic, ? IDA vs chronic process from lymphadenopathy  - Recent anemia panel notable for iron level 46, TIBC 152 - No signs of active bleeding - Repeat CBC in the morning  Moderate malnutrition - In the setting of ongoing illness outlined above - Nutritionist consult requested, recommendations appreciated   Hypokalemia - from Lasix - supplement  Acute leukopenia - from acute on chronic illness - monitor closely   DVT prophylaxis - Lovenox SQ  Code Status: Full.  Family Communication:  plan of care discussed with the patient and family over 45 minutes total time spent discussing diagnostic studies and further plan of care  Disposition Plan: Home when biopsy results are back  IV access:  Peripheral IV  Procedures and diagnostic studies:    Dg Chest 2 View 05/01/2015   Retrocardiac atelectasis or pneumonia with probable small effusion. Followup PA and lateral chest X-ray is recommended in 3-4 weeks following trial of antibiotic therapy to ensure resolution.     Ct Chest Wo Contrast 05/03/2015  Interval progression of bilateral axillary adenopathy concerning for recurrent lymphoma. 2. New bilateral pleural effusions and diffuse subcutaneous edema suggesting congestive heart failure and anasarca. 3. New 7 mm left upper lobe pulmonary nodule. If the patient is at high risk for bronchogenic carcinoma, follow-up chest CT at 3-2months is recommended. If the patient is at low risk for bronchogenic carcinoma, follow-up chest CT at 6-12 months is recommended. This recommendation follows the consensus statement: Guidelines for Management of Small Pulmonary Nodules Detected on CT Scans:   US Renal 05/02/2015   No evidence of hydronephrosis. 2. Mildly increased renal parenchymal echogenicity raises concern for medical renal disease. 3. Small left renal cyst noted.     Medical Consultants:   Oncology IR Surgery   Other Consultants:  None  IAnti-Infectives:   Zithromax 7/6 --> Unasyn 7/6 -->  Faye Ramsay, MD  [facility] Pager [phone]  If 7PM-7AM, please contact night-coverage [email or web address] Password [facility] 05/07/2015, 6:54 AM   LOS: 6 days   HPI/Subjective: No events overnight.   Objective: Filed Vitals:   05/06/15 1645 05/06/15 2033 05/06/15 2043 05/07/15 0533  BP: 143/45  147/47 127/41  Pulse: 88  114 100  Temp: 99 F (37.2 C)  98.7 F (37.1 C) 98.6 F (37 C)  TempSrc:   Axillary Oral  Resp: 13  16 14   Height:      Weight:    64.592 kg (142 lb 6.4 oz)  SpO2: 100% 98% 100% 96%    Intake/Output Summary (Last 24 hours) at 05/07/15 0654 Last data filed at 05/07/15 0601  Gross per 24 hour  Intake   1705 ml  Output     10 ml  Net   1695 ml    Exam:   General:  Pt is more alert, follows commands appropriately, not in acute distress  Cardiovascular: Regular rhythm, tachycardic, S1/S2, no murmurs, no rubs, no gallops  Respiratory: Clear to auscultation bilaterally, no wheezing, diminished breath sounds at bases   Abdomen: Soft, non tender, non distended, bowel sounds present, no guarding  Extremities: pulses DP and PT palpable  bilaterally, trace bilateral LE pitting edema   Neuro: Grossly nonfocal  Data Reviewed: Basic Metabolic Panel:  Recent Labs Lab 05/01/15 0950  05/03/15 0451 05/04/15 0530 05/05/15 0341 05/06/15 0356 05/07/15 0443  NA 136  < > 137 137 140 138 143  K 4.1  < > 3.8 3.3* 3.2* 3.5 3.2*  CL 105  < > 107 106 108 106 110  CO2 28  < > 24 25 27 27 27   GLUCOSE 125*  < > 89 98 100* 122* 91  BUN 39*  < > 35* 37* 36* 41* 42*  CREATININE 2.10*  < > 2.13* 2.10* 2.01* 2.01* 2.01*  CALCIUM 14.4*  < > 13.3* 12.8* 12.6* 13.0* 11.5*  MG 1.8  --   --   --   --   --   --   PHOS 4.0  --   --   --   --   --   --   < > = values in this interval not displayed. Liver Function Tests:  Recent Labs Lab 05/01/15 0606 05/01/15 0950   AST 21 16  ALT 13* 11*  ALKPHOS 51 48  BILITOT 0.5 0.6  PROT 7.4 7.0  ALBUMIN 2.6* 2.3*   CBC:  Recent Labs Lab 05/01/15 0606  05/03/15 0451 05/04/15 0530 05/05/15 0341 05/06/15 0356 05/07/15 0443  WBC 4.9  < > 4.2 4.2 3.8* 4.2 3.8*  NEUTROABS 2.6  --   --  2.2  --   --   --   HGB 11.1*  < > 10.9* 10.5* 10.5* 10.6* 9.9*  HCT 33.7*  < > 33.1* 32.4* 32.6* 32.3* 31.5*  MCV 76.4*  < > 77.0* 76.2* 76.3* 76.7* 76.6*  PLT 250  < > 234 235 252 235 191  < > = values in this interval not displayed.   Recent Results (from the past 240 hour(s))  Culture, blood (routine x 2) Call MD if unable to obtain prior to antibiotics being given     Status: None   Collection Time: 05/01/15  9:50 AM  Result Value Ref Range Status   Specimen Description BLOOD RIGHT WRIST  Final   Special Requests BOTTLES DRAWN AEROBIC AND ANAEROBIC 5CC  Final   Culture   Final    NO GROWTH 5 DAYS Performed at [facility]    Report Status 05/06/2015 FINAL  Final  Culture, blood (routine x 2) Call MD if unable to obtain prior to antibiotics being given     Status: None   Collection Time: 05/01/15 10:00 AM  Result Value Ref Range Status   Specimen Description BLOOD RIGHT ARM  Final   Special Requests BOTTLES DRAWN AEROBIC ONLY [place]  Final   Culture   Final    NO GROWTH 5 DAYS Performed at [facility]    Report Status 05/06/2015 FINAL  Final  Surgical pcr screen     Status: None   Collection Time: 05/06/15  1:09 PM  Result Value Ref Range Status   MRSA, PCR NEGATIVE NEGATIVE Final   Staphylococcus aureus NEGATIVE NEGATIVE Final    Comment:        The Xpert SA Assay (FDA approved for NASAL specimens in patients over 74 years of age), is one component of a comprehensive surveillance program.  Test performance has been validated by [facility] for patients greater than or equal to 85 year old. It is not intended to diagnose infection nor to guide or monitor treatment.  Scheduled  Meds: . ampicillin-sulbactam (UNASYN) IV  1.5 g Intravenous Q12H  . azithromycin  500 mg Oral Daily  . budesonide (PULMICORT) nebulizer solution  0.25 mg Nebulization BID  . enoxaparin (LOVENOX) injection  30 mg Subcutaneous Q24H  . feeding supplement (ENSURE ENLIVE)  237 mL Oral BID BM  . ferrous sulfate  325 mg Oral BID WC  . fluticasone  2 spray Each Nare Daily  . furosemide  20 mg Intravenous Daily   Continuous Infusions:

## 2015-05-07 NOTE — Progress Notes (Signed)
Patient ID: Connie Mccann, female   DOB: 1931/09/28, 81 y.o.   MRN: [id number]     [facility]      [address] [place], [place] [address]    Phone: [phone] FAX: [phone]     Subjective: More alert today.   Objective:  Vital signs:  Filed Vitals:   05/06/15 2033 05/06/15 2043 05/07/15 0533 05/07/15 0809  BP:  147/47 127/41   Pulse:  114 100   Temp:  98.7 F (37.1 C) 98.6 F (37 C)   TempSrc:  Axillary Oral   Resp:  16 14   Height:      Weight:   64.592 kg (142 lb 6.4 oz)   SpO2: 98% 100% 96% 93%    Last BM Date: 05/05/15  Intake/Output   Yesterday:  07/11 0701 - 07/12 0700 In: 9528 [P.O.:240; I.V.:1165; IV Piggyback:300] Out: 10 [Blood:10] This shift: I/O last 3 completed shifts: In: 4132 [P.O.:240; I.V.:1165; IV Piggyback:300] Out: 10 [Blood:10]     Physical Exam: General: Pt awake/alert and in no acute distress Skin: dermabond in place, no erythema, wound edges are approximated.     Problem List:   Active Problems:   Pneumonia   PNA (pneumonia)   Malnutrition of moderate degree   Lymphadenopathy   Liver lesion    Results:   Labs: Results for orders placed or performed during the hospital encounter of 05/01/15 (from the past 48 hour(s))  Protime-INR     Status: None   Collection Time: 05/05/15 10:19 AM  Result Value Ref Range   Prothrombin Time 15.1 11.6 - 15.2 seconds   INR 1.17 0.00 - 1.49  APTT     Status: None   Collection Time: 05/05/15 10:19 AM  Result Value Ref Range   aPTT 31 24 - 37 seconds  CBC     Status: Abnormal   Collection Time: 05/06/15  3:56 AM  Result Value Ref Range   WBC 4.2 4.0 - 10.5 K/uL   RBC 4.21 3.87 - 5.11 MIL/uL   Hemoglobin 10.6 (L) 12.0 - 15.0 g/dL   HCT 32.3 (L) 36.0 - 46.0 %   MCV 76.7 (L) 78.0 - 100.0 fL   MCH 25.2 (L) 26.0 - 34.0 pg   MCHC 32.8 30.0 - 36.0 g/dL   RDW 15.7 (H) 11.5 - 15.5 %   Platelets 235 150 - 400 K/uL  Basic metabolic panel      Status: Abnormal   Collection Time: 05/06/15  3:56 AM  Result Value Ref Range   Sodium 138 135 - 145 mmol/L   Potassium 3.5 3.5 - 5.1 mmol/L   Chloride 106 101 - 111 mmol/L   CO2 27 22 - 32 mmol/L   Glucose, Bld 122 (H) 65 - 99 mg/dL   BUN 41 (H) 6 - 20 mg/dL   Creatinine, Ser 2.01 (H) 0.44 - 1.00 mg/dL   Calcium 13.0 (H) 8.9 - 10.3 mg/dL   GFR calc non Af Amer 22 (L) >60 mL/min   GFR calc Af Amer 26 (L) >60 mL/min    Comment: (NOTE) The eGFR has been calculated using the CKD EPI equation. This calculation has not been validated in all clinical situations. eGFR's persistently <60 mL/min signify possible Chronic Kidney Disease.    Anion gap 5 5 - 15  Surgical pcr screen     Status: None   Collection Time: 05/06/15  1:09 PM  Result Value Ref Range   MRSA,  PCR NEGATIVE NEGATIVE   Staphylococcus aureus NEGATIVE NEGATIVE    Comment:        The Xpert SA Assay (FDA approved for NASAL specimens in patients over 12 years of age), is one component of a comprehensive surveillance program.  Test performance has been validated by [facility] for patients greater than or equal to 35 year old. It is not intended to diagnose infection nor to guide or monitor treatment.   CBC     Status: Abnormal   Collection Time: 05/07/15  4:43 AM  Result Value Ref Range   WBC 3.8 (L) 4.0 - 10.5 K/uL   RBC 4.11 3.87 - 5.11 MIL/uL   Hemoglobin 9.9 (L) 12.0 - 15.0 g/dL   HCT 31.5 (L) 36.0 - 46.0 %   MCV 76.6 (L) 78.0 - 100.0 fL   MCH 24.1 (L) 26.0 - 34.0 pg   MCHC 31.4 30.0 - 36.0 g/dL   RDW 15.9 (H) 11.5 - 15.5 %   Platelets 191 150 - 400 K/uL  Basic metabolic panel     Status: Abnormal   Collection Time: 05/07/15  4:43 AM  Result Value Ref Range   Sodium 143 135 - 145 mmol/L   Potassium 3.2 (L) 3.5 - 5.1 mmol/L   Chloride 110 101 - 111 mmol/L   CO2 27 22 - 32 mmol/L   Glucose, Bld 91 65 - 99 mg/dL   BUN 42 (H) 6 - 20 mg/dL   Creatinine, Ser 2.01 (H) 0.44 - 1.00 mg/dL   Calcium 11.5  (H) 8.9 - 10.3 mg/dL   GFR calc non Af Amer 22 (L) >60 mL/min   GFR calc Af Amer 26 (L) >60 mL/min    Comment: (NOTE) The eGFR has been calculated using the CKD EPI equation. This calculation has not been validated in all clinical situations. eGFR's persistently <60 mL/min signify possible Chronic Kidney Disease.    Anion gap 6 5 - 15    Imaging / Studies: No results found.  Medications / Allergies:  Scheduled Meds: . ampicillin-sulbactam (UNASYN) IV  1.5 g Intravenous Q12H  . budesonide (PULMICORT) nebulizer solution  0.25 mg Nebulization BID  . enoxaparin (LOVENOX) injection  30 mg Subcutaneous Q24H  . feeding supplement (ENSURE ENLIVE)  237 mL Oral BID BM  . ferrous sulfate  325 mg Oral BID WC  . fluticasone  2 spray Each Nare Daily  . furosemide  20 mg Intravenous Daily   Continuous Infusions:  PRN Meds:.albuterol, hydrALAZINE, sodium chloride  Antibiotics: Anti-infectives    Start     Dose/Rate Route Frequency Ordered Stop   05/06/15 1300  ceFAZolin (ANCEF) IVPB 2 g/50 mL premix    Comments:  Pharmacy may adjust dosing strength, interval, or rate of medication as needed for optimal therapy for the patient  Send with patient on call to the OR.  Anesthesia to complete antibiotic administration <33mn [facility]   2 g 100 mL/hr over 30 Minutes Intravenous On call to O.R. 05/06/15 1254 05/07/15 0559   05/05/15 1200  azithromycin (ZITHROMAX) tablet 500 mg     500 mg Oral Daily 05/05/15 0924 05/07/15 0911   05/01/15 2200  ampicillin-sulbactam (UNASYN) 1.5 g in sodium chloride 0.9 % 50 mL IVPB     1.5 g 100 mL/hr over 30 Minutes Intravenous Every 12 hours 05/01/15 1442     05/01/15 1100  azithromycin (ZITHROMAX) 500 mg in dextrose 5 % 250 mL IVPB  Status:  Discontinued  500 mg 250 mL/hr over 60 Minutes Intravenous Every 24 hours 05/01/15 0920 05/05/15 0924   05/01/15 1000  cefTRIAXone (ROCEPHIN) 1 g in dextrose 5 % 50 mL IVPB - Premix  Status:   Discontinued     1 g 100 mL/hr over 30 Minutes Intravenous Every 24 hours 05/01/15 0920 05/01/15 1442        Assessment/Plan POD#1 right axillary lymph node biopsy---Dr. Hassell Done -stable. No further restrictions. -surgery signing off.  Please call with questions or concerns.  Erby Pian, [facility] Surgery Pager [phone](7A-4:30P)   05/07/2015 9:29 AM

## 2015-05-08 DIAGNOSIS — R5383 Other fatigue: Secondary | ICD-10-CM | POA: Diagnosis present

## 2015-05-08 DIAGNOSIS — R599 Enlarged lymph nodes, unspecified: Secondary | ICD-10-CM

## 2015-05-08 LAB — BASIC METABOLIC PANEL
Anion gap: 7 (ref 5–15)
BUN: 42 mg/dL — ABNORMAL HIGH (ref 6–20)
CO2: 27 mmol/L (ref 22–32)
CREATININE: 1.81 mg/dL — AB (ref 0.44–1.00)
Calcium: 11.4 mg/dL — ABNORMAL HIGH (ref 8.9–10.3)
Chloride: 110 mmol/L (ref 101–111)
GFR calc Af Amer: 29 mL/min — ABNORMAL LOW (ref >60)
GFR, EST NON AFRICAN AMERICAN: 25 mL/min — AB (ref >60)
Glucose, Bld: 94 mg/dL (ref 65–99)
Potassium: 3.1 mmol/L — ABNORMAL LOW (ref 3.5–5.1)
SODIUM: 144 mmol/L (ref 135–145)

## 2015-05-08 LAB — CBC
HCT: 32.4 % — ABNORMAL LOW (ref 36.0–46.0)
Hemoglobin: 10.4 g/dL — ABNORMAL LOW (ref 12.0–15.0)
MCH: 24.8 pg — ABNORMAL LOW (ref 26.0–34.0)
MCHC: 32.1 g/dL (ref 30.0–36.0)
MCV: 77.3 fL — ABNORMAL LOW (ref 78.0–100.0)
PLATELETS: 192 10*3/uL (ref 150–400)
RBC: 4.19 MIL/uL (ref 3.87–5.11)
RDW: 16.1 % — ABNORMAL HIGH (ref 11.5–15.5)
WBC: 4.1 10*3/uL (ref 4.0–10.5)

## 2015-05-08 LAB — FERRITIN: Ferritin: 207 ng/mL (ref 11–307)

## 2015-05-08 MED ORDER — POTASSIUM CHLORIDE ER 20 MEQ PO TBCR: 20.0000 meq | EXTENDED_RELEASE_TABLET | Freq: Every day | ORAL | Status: AC

## 2015-05-08 MED ORDER — POTASSIUM CHLORIDE CRYS ER 20 MEQ PO TBCR
40.0000 meq | EXTENDED_RELEASE_TABLET | Freq: Once | ORAL | Status: AC
  Administered 2015-05-08: 40 meq via ORAL
  Filled 2015-05-08: qty 2

## 2015-05-08 MED ORDER — POTASSIUM CHLORIDE CRYS ER 20 MEQ PO TBCR: 40.0000 meq | EXTENDED_RELEASE_TABLET | Freq: Once | ORAL | Status: DC

## 2015-05-08 NOTE — Progress Notes (Signed)
I agree with the previous nurse's assessment 

## 2015-05-08 NOTE — Progress Notes (Addendum)
Patient ID: Connie Mccann, female   DOB: August 26, 1931, 81 y.o.   MRN: [id number]  [facility] PROGRESS NOTE  Connie Mccann LKT:[id number] DOB: [birth year] DOA: 05/01/2015 PCP: Iran Planas, PA-C   Brief narrative:    Patient is 81 year old female with chronic diastolic CHF, grade 1 based on 2-D echo in 2015, dementia, presented to [facility] emergency department with main concern of several weeks duration off progressively worsening fatigue and weakness, poor oral intake, has been mostly bed bound per husband who is present at the bedside at the time of the admission. Patient explains that at baseline she is typically ambulatory and likes to move around but hasn't been able to do so in few weeks. She reports seeing her primary care provider week prior to this admission and was found to be anemic, she was instructed to take iron supplements which she did but her symptoms have not improved. Patient reports subjective fevers and chills at home, mixed episodes and productive and nonproductive cough, she feels like she has lost some weight but is unsure how much. She denies recent sick contacts or exposures, no recent traveling, no specific abdominal concerns, no specific urinary concerns. Patient also denies any specific focal neurological symptoms such as numbness, tingling, no visual changes or headaches, no changes in sensation.  In emergency department, vital signs stable, patient is hemodynamically stable, blood work notable for hemoglobin 11, creatinine 2.34, calcium greater than 15. Chest x-ray concerning for developing pneumonia. [facility] asked to admit to telemetry bed for further evaluation.  Please note that hospital course has been complicated by persistent hypercalcemia very slow to respond to Fluid, IV Lasix, Pamidronate. 7/12 and 7/13, calcium stabilizing and trending towards normal range. Pt is status post right axillary LN biopsy (done 7/11 by Dr. Hassell Done and biopsy report still  pending).  Please also note that grandson requested to update him, pt's husband, or daughter Georga Kaufmann, no other family members.   Assessment/Plan:    Active Problems: Generalized weakness - determined to be secondary to progressive failure to thrive and deconditioning in the setting of what appears to be progressive lymphoma, subsequent malignancy induced hypercalcemia, also lobar PNA, imposed on known dementia  - pt completed course of ABX Unasyn and Zithromax for 7 days - pt is clinically improving but still intermittently confused and per family close to her baseline  - Encouraged oral intake as patient able to tolerate - also encourage ambulation, preferred at least per shift and at minimum OOB to chair - family is in agreement with placement to SNF which pt will certainly need upon discharge   Community acquired pneumonia, unknown pathogen, retrocardiac area - Sputum analysis requested, culture, urine Legionella and strep pneumo, negative to date  - Continue Zithromax and Unasyn day #7/7, stop date 05/07/2015 - IS while awake, pulmonary toiletry, ambulation as tolerating   High risk aspiration - has been refusing to swallow tablets - improving overall  - thin diet recommended and pt tolerating well but still with poor oral intake   Severe hypercalcemia - with current blood work results including intact PTH 9 (low) and Ca >14 (high), CT chest concerning for lymphoma, it is now apparent that hypercalcemia is malignancy related - pt given one dose of Pamidronate 7/8 and Ca is trending down  - continue Lasix, please note IVF have been stopped due to pulmonary vascular congestion and volume overload  - repeat BMP in AM  Pulmonary vascular congestion secondary to acute on chronic diastolic CHF, grade I  -  exacerbated by IVF that pt has received for treatment of hypercalcemia as well as underlying moderate hypoalbuminemia   - last 2 D ECHO was done 09/2014 and was notable for normal and  preserved EF with grade I diastolic CHF - no need for repeat ECHO at  - we have discontinued IVF 7/11 and weight is so far stable at 64 kg - continue Lasix as noted above and monitor clinical response  - monitor daily weights, strict I/O  History of extensive lymphadenopathy  - noted on CT abd and pelvis in August 2015, also noted new 7 mm left upper lobe pulmonary nodule. - CT chest done 7/8, results below, d/w family concern for lymphoma - d/w Dr. Burr Medico, pt is now s/p right axillary LN biopsy (done 7/11), awaiting for path report  - per Dr. Burr Medico, preliminary findings concerning for T-cell lymphoproliferative disorder, especially angioimmunoblastic T-cell lymphoma - still waiting for the flow cytometry and is immunostaining to confirm - further recommendations pending LN biopsy results   New 7 mm left upper lobe pulmonary nodule - noted on recent CT chest - not clear if related to possible lymphoma or underlying bronchogenic carcinoma - hold off on current work up until LN biopsy if back   6 cm posterior right hepatic lobe lesion  - noted on PET scan 05/2014 and with evidence of metabolic activity suspicious for neoplastic process  - recommended MRI abd has not been dose as pt was unable to complete exam, unable to hold breath long enough - further work up pending LN biopsy as noted above .  Acute metabolic encephalopathy - Likely secondary to community-acquired pneumonia, hypocalcemia, acute on chronic renal failure and progressive failure to thrive  - slowly improving but given underling dementia, not clear how much better will she get in terms of mental status   Acute on chronic renal failure, stage II - Based on GFR patient meets criteria for stage II chronic kidney failure - Baseline creatinine in the past 6 months has been 0.9-1.3 - Higher on this admission ~2 and likely prerenal in etiology from poor oral intake and dehydration, hypercalcemia  - holding losartan that pt has been  taking at home - renal US with no hydro and suggestive of chronic renal disease - Cr is slowly trending down even with being on Lasix  - will repeat BMP in AM  Anemia of chronic disease, microcytic, chronic process from lymphadenopathy, ? Lymphoma  - Recent anemia panel notable for iron level 46 (normal), TIBC 152 (low) c/w anemia of chronic disease - ferritin requested  - No signs of active bleeding - Repeat CBC in the morning  Moderate malnutrition - In the setting of ongoing illness outlined above - Nutritionist consult requested, recommendations appreciated   Hypokalemia - from Lasix - supplement and repeat BMP in MA  Acute leukopenia - from acute illness, now WNL  - monitor closely   Acute functional quadriplegia - OOB to chair at least per shift  DVT prophylaxis - Lovenox SQ  Code Status: Full.  Family Communication:  plan of care discussed with the patient and grandson at bedside  Disposition Plan: In agreement with SNF when medically ready   IV access:  PICC line placed 05/04/2015 as pt was difficult stick  Procedures and diagnostic studies:    Dg Chest 2 View 05/01/2015   Retrocardiac atelectasis or pneumonia with probable small effusion. Followup PA and lateral chest X-ray is recommended in 3-4 weeks following trial of antibiotic therapy to ensure  resolution.     Ct Chest Wo Contrast 05/03/2015  Interval progression of bilateral axillary adenopathy concerning for recurrent lymphoma. 2. New bilateral pleural effusions and diffuse subcutaneous edema suggesting congestive heart failure and anasarca. 3. New 7 mm left upper lobe pulmonary nodule. If the patient is at high risk for bronchogenic carcinoma, follow-up chest CT at 3-26months is recommended. If the patient is at low risk for bronchogenic carcinoma, follow-up chest CT at 6-12 months is recommended. This recommendation follows the consensus statement: Guidelines for Management of Small Pulmonary Nodules Detected on CT  Scans:   US Renal 05/02/2015   No evidence of hydronephrosis. 2. Mildly increased renal parenchymal echogenicity raises concern for medical renal disease. 3. Small left renal cyst noted.     Right axillary LN biopsy by Dr. Hassell Done 05/06/2015 - biopsy report still pendng   Medical Consultants:  Oncologist Dr. Burr Medico  IR - signed off 7/10 as surgery has taken over to perform excisional LN biopsy  Surgery - signed off 7/13  Other Consultants:  PT - recommend SNF once medically stable  Nutritionist - determined pt with moderate malnutrition  SLP - recommended thin diet  Pharmacist - ABX monitoring   IAnti-Infectives:   Zithromax 7/6 --> 7/12 Unasyn 7/6 --> 7/12  Faye Ramsay, MD  [facility] Pager [phone]  If 7PM-7AM, please contact night-coverage [email or web address] Password [facility] 05/08/2015, 3:51 PM   LOS: 7 days   HPI/Subjective: No events overnight. Pt reports feeling better, denies pain, still with very poor oral intake.   Objective: Filed Vitals:   05/08/15 0508 05/08/15 0520 05/08/15 0754 05/08/15 1428  BP:  136/60  117/40  Pulse: 92   104  Temp: 98.4 F (36.9 C)   98.1 F (36.7 C)  TempSrc: Oral   Oral  Resp: 18   20  Height:      Weight: 64.7 kg (142 lb 10.2 oz)     SpO2: 93%  95% 99%    Intake/Output Summary (Last 24 hours) at 05/08/15 1551 Last data filed at 05/08/15 1428  Gross per 24 hour  Intake    777 ml  Output    150 ml  Net    627 ml    Exam:   General:  Pt is more alert, follows commands appropriately, not in acute distress  Cardiovascular: Regular rhythm, tachycardic, S1/S2, no murmurs, no rubs, no gallops  Respiratory: Clear to auscultation bilaterally, no wheezing, diminished breath sounds at bases   Abdomen: Soft, non tender, non distended, bowel sounds present, no guarding  Extremities: pulses DP and PT palpable bilaterally, trace bilateral LE pitting edema, bilateral UE edema improving   Data Reviewed: Basic Metabolic Panel:  Recent  Labs Lab 05/04/15 0530 05/05/15 0341 05/06/15 0356 05/07/15 0443 05/08/15 0510  NA 137 140 138 143 144  K 3.3* 3.2* 3.5 3.2* 3.1*  CL 106 108 106 110 110  CO2 25 27 27 27 27   GLUCOSE 98 100* 122* 91 94  BUN 37* 36* 41* 42* 42*  CREATININE 2.10* 2.01* 2.01* 2.01* 1.81*  CALCIUM 12.8* 12.6* 13.0* 11.5* 11.4*   CBC:  Recent Labs Lab 05/04/15 0530 05/05/15 0341 05/06/15 0356 05/07/15 0443 05/08/15 0510  WBC 4.2 3.8* 4.2 3.8* 4.1  NEUTROABS 2.2  --   --   --   --   HGB 10.5* 10.5* 10.6* 9.9* 10.4*  HCT 32.4* 32.6* 32.3* 31.5* 32.4*  MCV 76.2* 76.3* 76.7* 76.6* 77.3*  PLT 235 252 235 191 192  Recent Results (from the past 240 hour(s))  Culture, blood (routine x 2) Call MD if unable to obtain prior to antibiotics being given     Status: None   Collection Time: 05/01/15  9:50 AM  Result Value Ref Range Status   Specimen Description BLOOD RIGHT WRIST  Final   Special Requests BOTTLES DRAWN AEROBIC AND ANAEROBIC 5CC  Final   Culture   Final    NO GROWTH 5 DAYS Performed at [facility]    Report Status 05/06/2015 FINAL  Final  Culture, blood (routine x 2) Call MD if unable to obtain prior to antibiotics being given     Status: None   Collection Time: 05/01/15 10:00 AM  Result Value Ref Range Status   Specimen Description BLOOD RIGHT ARM  Final   Special Requests BOTTLES DRAWN AEROBIC ONLY [place]  Final   Culture   Final    NO GROWTH 5 DAYS Performed at [facility]    Report Status 05/06/2015 FINAL  Final  Surgical pcr screen     Status: None   Collection Time: 05/06/15  1:09 PM  Result Value Ref Range Status   MRSA, PCR NEGATIVE NEGATIVE Final   Staphylococcus aureus NEGATIVE NEGATIVE Final    Comment:        The Xpert SA Assay (FDA approved for NASAL specimens in patients over 88 years of age), is one component of a comprehensive surveillance program.  Test performance has been validated by [facility] for patients greater than or equal to 60  year old. It is not intended to diagnose infection nor to guide or monitor treatment.      Scheduled Meds: . budesonide (PULMICORT) nebulizer solution  0.25 mg Nebulization BID  . enoxaparin (LOVENOX) injection  30 mg Subcutaneous Q24H  . feeding supplement (ENSURE ENLIVE)  237 mL Oral BID BM  . ferrous sulfate  325 mg Oral BID WC  . fluticasone  2 spray Each Nare Daily  . furosemide  20 mg Intravenous Daily   Continuous Infusions:

## 2015-05-08 NOTE — Progress Notes (Signed)
Connie Mccann   DOB:12/26/1930   ZO#:[id number]   [id number]  Subjective: Pt is awake and alert, feels better. Calcium down to 11.4 today.    Objective:  Filed Vitals:   05/08/15 1428  BP: 117/40  Pulse: 104  Temp: 98.1 F (36.7 C)  Resp: 20    Body mass index is 25.27 kg/(m^2).  Intake/Output Summary (Last 24 hours) at 05/08/15 1531 Last data filed at 05/08/15 1428  Gross per 24 hour  Intake    777 ml  Output    150 ml  Net    627 ml     Sclerae unicteric  Oropharynx clear  (+) bulky b/l axillary adenopathy   (+) skin rash trunk  Lungs clear -- no rales or rhonchi  Heart regular rate and rhythm  Abdomen benign  MSK no focal spinal tenderness, no peripheral edema  Neuro nonfocal    CBG (last 3)  No results for input(s): GLUCAP in the last 72 hours.   Labs:  Lab Results  Component Value Date   WBC 4.1 05/08/2015   HGB 10.4* 05/08/2015   HCT 32.4* 05/08/2015   MCV 77.3* 05/08/2015   PLT 192 05/08/2015   NEUTROABS 2.2 05/04/2015    @LASTCHEMISTRY @  Urine Studies No results for input(s): UHGB, CRYS in the last 72 hours.  Invalid input(s): UACOL, UAPR, USPG, UPH, UTP, UGL, UKET, UBIL, UNIT, UROB, [place], UEPI, UWBC, Duwayne Heck [place], [place]  Basic Metabolic Panel:  Recent Labs Lab 05/04/15 0530 05/05/15 0341 05/06/15 0356 05/07/15 0443 05/08/15 0510  NA 137 140 138 143 144  K 3.3* 3.2* 3.5 3.2* 3.1*  CL 106 108 106 110 110  CO2 25 27 27 27 27   GLUCOSE 98 100* 122* 91 94  BUN 37* 36* 41* 42* 42*  CREATININE 2.10* 2.01* 2.01* 2.01* 1.81*  CALCIUM 12.8* 12.6* 13.0* 11.5* 11.4*   GFR Estimated Creatinine Clearance: 22.1 mL/min (by C-G formula based on Cr of 1.81). Liver Function Tests: No results for input(s): AST, ALT, ALKPHOS, BILITOT, PROT, ALBUMIN in the last 168 hours. No results for input(s): LIPASE, AMYLASE in the last 168 hours. No results for input(s): AMMONIA in the last 168 hours. Coagulation profile  Recent Labs Lab  05/05/15 1019  INR 1.17    CBC:  Recent Labs Lab 05/04/15 0530 05/05/15 0341 05/06/15 0356 05/07/15 0443 05/08/15 0510  WBC 4.2 3.8* 4.2 3.8* 4.1  NEUTROABS 2.2  --   --   --   --   HGB 10.5* 10.5* 10.6* 9.9* 10.4*  HCT 32.4* 32.6* 32.3* 31.5* 32.4*  MCV 76.2* 76.3* 76.7* 76.6* 77.3*  PLT 235 252 235 191 192   Cardiac Enzymes: No results for input(s): CKTOTAL, CKMB, CKMBINDEX, TROPONINI in the last 168 hours. BNP: Invalid input(s): POCBNP CBG: No results for input(s): GLUCAP in the last 168 hours. D-Dimer No results for input(s): DDIMER in the last 72 hours. Hgb A1c No results for input(s): HGBA1C in the last 72 hours. Lipid Profile No results for input(s): CHOL, HDL, LDLCALC, TRIG, CHOLHDL, LDLDIRECT in the last 72 hours. Thyroid function studies No results for input(s): TSH, T4TOTAL, T3FREE, THYROIDAB in the last 72 hours.  Invalid input(s): FREET3 Anemia work up No results for input(s): VITAMINB12, FOLATE, FERRITIN, TIBC, IRON, RETICCTPCT in the last 72 hours. Microbiology Recent Results (from the past 240 hour(s))  Culture, blood (routine x 2) Call MD if unable to obtain prior to antibiotics being given     Status: None  Collection Time: 05/01/15  9:50 AM  Result Value Ref Range Status   Specimen Description BLOOD RIGHT WRIST  Final   Special Requests BOTTLES DRAWN AEROBIC AND ANAEROBIC 5CC  Final   Culture   Final    NO GROWTH 5 DAYS Performed at [facility]    Report Status 05/06/2015 FINAL  Final  Culture, blood (routine x 2) Call MD if unable to obtain prior to antibiotics being given     Status: None   Collection Time: 05/01/15 10:00 AM  Result Value Ref Range Status   Specimen Description BLOOD RIGHT ARM  Final   Special Requests BOTTLES DRAWN AEROBIC ONLY [place]  Final   Culture   Final    NO GROWTH 5 DAYS Performed at [facility]    Report Status 05/06/2015 FINAL  Final  Surgical pcr screen     Status: None   Collection Time:  05/06/15  1:09 PM  Result Value Ref Range Status   MRSA, PCR NEGATIVE NEGATIVE Final   Staphylococcus aureus NEGATIVE NEGATIVE Final    Comment:        The Xpert SA Assay (FDA approved for NASAL specimens in patients over 76 years of age), is one component of a comprehensive surveillance program.  Test performance has been validated by [facility] for patients greater than or equal to 72 year old. It is not intended to diagnose infection nor to guide or monitor treatment.       Studies:  No results found.  Assessment:  81 y.o. with past medical history of CHF, otherwise healthy and physically active, presented with diffuse adenopathy since August 2015, previous inguinal biopsy was negative for malignancy. PET scan in 06/14/2014 showed mild hypermetabolic adenopathy in the neck, chest, abdomen and pelvis, and a 6 cm low attenuation lesion in posterior right hepatic lobe, with only minimal metabolic activity in the peripheral of the lesion.   1. Diffuse adenopathy, suspicious for T-cell lymphoma  2. Hypercalcemia, improved  3. Non-hypermetabolic liver lesion, unknown etiology  4. Anemia 5. AKI, improving  4. CHF  5. Altered mental status likely secondary to hypercalcemia   Plan:  -I spoke with pathologist Dr. Gari Crown. Based o morphology of the axillary lymph node biopsy, Dr. Gari Crown thinks this is likely T-cell lymphoproliferative disorder, especially angioimmunoblastic T-cell lymphoma. We're waiting for the flow cytometry and is immunostaining to confirm. -I'll meet patient, her husband and her daughters again tomorrow late afternoon to discuss her diagnosis and treatment options. -Please check ferritin, her iron study on 6/30 revealed normal serum iron, low TIBC, this is likely c/w anemia of chronic disease     Truitt Merle, MD 05/08/2015  3:31 PM

## 2015-05-09 ENCOUNTER — Ambulatory Visit (HOSPITAL_COMMUNITY): Payer: Medicare Other

## 2015-05-09 ENCOUNTER — Encounter (HOSPITAL_COMMUNITY): Payer: Self-pay | Admitting: Surgery

## 2015-05-09 DIAGNOSIS — R609 Edema, unspecified: Secondary | ICD-10-CM

## 2015-05-09 DIAGNOSIS — R932 Abnormal findings on diagnostic imaging of liver and biliary tract: Secondary | ICD-10-CM

## 2015-05-09 LAB — CBC
HCT: 32.1 % — ABNORMAL LOW (ref 36.0–46.0)
Hemoglobin: 10.5 g/dL — ABNORMAL LOW (ref 12.0–15.0)
MCH: 24.8 pg — ABNORMAL LOW (ref 26.0–34.0)
MCHC: 32.7 g/dL (ref 30.0–36.0)
MCV: 75.9 fL — ABNORMAL LOW (ref 78.0–100.0)
Platelets: 165 10*3/uL (ref 150–400)
RBC: 4.23 MIL/uL (ref 3.87–5.11)
RDW: 16 % — ABNORMAL HIGH (ref 11.5–15.5)
WBC: 4 10*3/uL (ref 4.0–10.5)

## 2015-05-09 LAB — BASIC METABOLIC PANEL
ANION GAP: 4 — AB (ref 5–15)
BUN: 41 mg/dL — AB (ref 6–20)
CALCIUM: 11.4 mg/dL — AB (ref 8.9–10.3)
CHLORIDE: 111 mmol/L (ref 101–111)
CO2: 27 mmol/L (ref 22–32)
Creatinine, Ser: 1.66 mg/dL — ABNORMAL HIGH (ref 0.44–1.00)
GFR, EST AFRICAN AMERICAN: 32 mL/min — AB (ref >60)
GFR, EST NON AFRICAN AMERICAN: 28 mL/min — AB (ref >60)
GLUCOSE: 109 mg/dL — AB (ref 65–99)
Potassium: 3.2 mmol/L — ABNORMAL LOW (ref 3.5–5.1)
Sodium: 142 mmol/L (ref 135–145)

## 2015-05-09 MED ORDER — POTASSIUM CHLORIDE 10 MEQ/100ML IV SOLN
10.0000 meq | INTRAVENOUS | Status: DC
  Administered 2015-05-09 (×4): 10 meq via INTRAVENOUS
  Filled 2015-05-09 (×4): qty 100

## 2015-05-09 MED ORDER — FUROSEMIDE 10 MG/ML IJ SOLN
10.0000 mg | Freq: Every day | INTRAMUSCULAR | Status: DC
  Administered 2015-05-10 – 2015-05-12 (×3): 10 mg via INTRAVENOUS
  Filled 2015-05-09 (×3): qty 2

## 2015-05-09 MED ORDER — POTASSIUM CHLORIDE 10 MEQ/100ML IV SOLN
INTRAVENOUS | Status: AC
  Filled 2015-05-09: qty 100

## 2015-05-09 NOTE — Progress Notes (Signed)
Speech Language Pathology Treatment: Dysphagia  Patient Details Name: Connie Mccann MRN: [id number] DOB: 1931/03/30 Today's Date: 05/09/2015 Time: [id number] SLP Time Calculation (min) (ACUTE ONLY): 30 min  Assessment / Plan / Recommendation Clinical Impression  Pt sitting upright in chair, lunch tray arrived during session.  Pt consumed 1/3 of pudding, 1/2 soup, entire container of milk, 1/2 cup tea.  Mildly delayed oral transiting noted with purees and thicker consistencies. RN reports pt pocketing foods easily. Grandson present also states pt more accepting of liquids, thinner consistencies.  Per grandson Connie Mccann, pt did not have dysphagia prior to admit but currently continues to demonstrate difficulties/resistance to thicker consistencies.  No overt indication of aspiration however pt does demonstrate inconsistent throat clearing = which she states is normal.   Recommend to continue full liquids for maximal efficiency and airway protection.  Educated pt and grandson and both are agreeable to plan.  Goal is for pt to return to premorbid diet with continued improvement.    HPI Other Pertinent Information: Patient is 81 year old female with chronic diastolic CHF, grade 1 based on 2-D echo in 2015, presented to [facility] emergency department with main concern of several weeks duration off progressively worsening fatigue and weakness, poor oral intake, has been mostly bed bound. Chest x-ray concerning for developing pneumonia per MD. Report states New bilateral pleural effusions and diffuse subcutaneous edema suggesting congestive heart failure and anasarca and New 7 mm left upper lobe pulmonary nodule which MD states is concerning for Lymphoma.  Pt had BSE on Sunday and today is seen for follow up to assess po tolerance and readiness for po advancement.  CT chest 7/8 showed findings consistent with CHF and anasarca.     Pertinent Vitals Pain Assessment: No/denies pain  SLP Plan  Continue with  current plan of care    Recommendations Diet recommendations: Dysphagia 1 (puree) (recommend continue full liquids - grandson states pt much more accepting of liquids only - thinner consistency) Liquids provided via: Straw;Cup Medication Administration:  (as tolerated) Supervision: Staff to assist with self feeding (due to arm edema) Compensations: Slow rate;Small sips/bites Postural Changes and/or Swallow Maneuvers: Seated upright 90 degrees;Upright 30-60 min after meal              Oral Care Recommendations: Oral care BID Follow up Recommendations: Other (comment) (TBD) Plan: Continue with current plan of care    [place], [place] [facility] SLP [phone]

## 2015-05-09 NOTE — Progress Notes (Signed)
Connie Mccann   DOB:[birth year]   CX#:[id number]   [id number]  Subjective: Pt is awake and alert, has poor appetite, did not eat much today. She was evaluated by physical therapist and her SNF placement was recommended. I met patient, her husband, daughters, son, and multiple other family members later this afternoon at patient's room.   Objective:  Filed Vitals:   05/09/15 1514  BP: 163/49  Pulse: 97  Temp: 98.4 F (36.9 C)  Resp: 20    Body mass index is 24.42 kg/(m^2).  Intake/Output Summary (Last 24 hours) at 05/09/15 1827 Last data filed at 05/09/15 1515  Gross per 24 hour  Intake    720 ml  Output      3 ml  Net    717 ml     Sclerae unicteric  Oropharynx clear  (+) bulky b/l axillary adenopathy   (+) skin rash trunk  Lungs clear -- no rales or rhonchi  Heart regular rate and rhythm  Abdomen benign  MSK no focal spinal tenderness, no peripheral edema  Neuro nonfocal    CBG (last 3)  No results for input(s): GLUCAP in the last 72 hours.   Labs:  Lab Results  Component Value Date   WBC 4.0 05/09/2015   HGB 10.5* 05/09/2015   HCT 32.1* 05/09/2015   MCV 75.9* 05/09/2015   PLT 165 05/09/2015   NEUTROABS 2.2 05/04/2015    @LASTCHEMISTRY @  Urine Studies No results for input(s): UHGB, CRYS in the last 72 hours.  Invalid input(s): UACOL, UAPR, USPG, UPH, UTP, UGL, UKET, UBIL, UNIT, UROB, [place], UEPI, UWBC, [place]wayne Heck [place], [place]  Basic Metabolic Panel:  Recent Labs Lab 05/05/15 0341 05/06/15 0356 05/07/15 0443 05/08/15 0510 05/09/15 0450  NA 140 138 143 144 142  K 3.2* 3.5 3.2* 3.1* 3.2*  CL 108 106 110 110 111  CO2 27 27 27 27 27   GLUCOSE 100* 122* 91 94 109*  BUN 36* 41* 42* 42* 41*  CREATININE 2.01* 2.01* 2.01* 1.81* 1.66*  CALCIUM 12.6* 13.0* 11.5* 11.4* 11.4*   GFR Estimated Creatinine Clearance: 22 mL/min (by C-G formula based on Cr of 1.66). Liver Function Tests: No results for input(s): AST, ALT, ALKPHOS, BILITOT, PROT, ALBUMIN  in the last 168 hours. No results for input(s): LIPASE, AMYLASE in the last 168 hours. No results for input(s): AMMONIA in the last 168 hours. Coagulation profile  Recent Labs Lab 05/05/15 1019  INR 1.17    CBC:  Recent Labs Lab 05/04/15 0530 05/05/15 0341 05/06/15 0356 05/07/15 0443 05/08/15 0510 05/09/15 0450  WBC 4.2 3.8* 4.2 3.8* 4.1 4.0  NEUTROABS 2.2  --   --   --   --   --   HGB 10.5* 10.5* 10.6* 9.9* 10.4* 10.5*  HCT 32.4* 32.6* 32.3* 31.5* 32.4* 32.1*  MCV 76.2* 76.3* 76.7* 76.6* 77.3* 75.9*  PLT 235 252 235 191 192 165   Cardiac Enzymes: No results for input(s): CKTOTAL, CKMB, CKMBINDEX, TROPONINI in the last 168 hours. BNP: Invalid input(s): POCBNP CBG: No results for input(s): GLUCAP in the last 168 hours. D-Dimer No results for input(s): DDIMER in the last 72 hours. Hgb A1c No results for input(s): HGBA1C in the last 72 hours. Lipid Profile No results for input(s): CHOL, HDL, LDLCALC, TRIG, CHOLHDL, LDLDIRECT in the last 72 hours. Thyroid function studies No results for input(s): TSH, T4TOTAL, T3FREE, THYROIDAB in the last 72 hours.  Invalid input(s): FREET3 Anemia work up  [employer]  05/08/15 1700  FERRITIN 207   Microbiology Recent Results (from the past 240 hour(s))  Culture, blood (routine x 2) Call MD if unable to obtain prior to antibiotics being given     Status: None   Collection Time: 05/01/15  9:50 AM  Result Value Ref Range Status   Specimen Description BLOOD RIGHT WRIST  Final   Special Requests BOTTLES DRAWN AEROBIC AND ANAEROBIC 5CC  Final   Culture   Final    NO GROWTH 5 DAYS Performed at [facility]    Report Status 05/06/2015 FINAL  Final  Culture, blood (routine x 2) Call MD if unable to obtain prior to antibiotics being given     Status: None   Collection Time: 05/01/15 10:00 AM  Result Value Ref Range Status   Specimen Description BLOOD RIGHT ARM  Final   Special Requests BOTTLES DRAWN AEROBIC ONLY [place]   Final   Culture   Final    NO GROWTH 5 DAYS Performed at [facility]    Report Status 05/06/2015 FINAL  Final  Surgical pcr screen     Status: None   Collection Time: 05/06/15  1:09 PM  Result Value Ref Range Status   MRSA, PCR NEGATIVE NEGATIVE Final   Staphylococcus aureus NEGATIVE NEGATIVE Final    Comment:        The Xpert SA Assay (FDA approved for NASAL specimens in patients over 40 years of age), is one component of a comprehensive surveillance program.  Test performance has been validated by [facility] for patients greater than or equal to 79 year old. It is not intended to diagnose infection nor to guide or monitor treatment.    PATHOLOGY REPORT: Lymph node for lymphoma, right axillary - ATYPICAL LYMPHOID PROLIFERATION. - SEE COMMENT. Microscopic Comment The sections show effacement of the lymph node architecture by variably sized clusters and sheets of small to medium sized lymphoid cells displaying round to slightly irregular nuclei, dense chromatin, inconspicuous nucleoli, and moderately abundant amphophilic to clear cytoplasm. This is admixed with a relatively abundant component of plasma cells. Scattered large lymphoid cells are also present but this is not a prominent finding. The appearance is primarily diffuse with associated striking vascular proliferation of primarily high endothelial venules. The lymphoid process also extends into the perinodal tissue, and spares the cortical sinuses. To further evaluate this process, flow cytometric analysis ([id number]) was performed, and shows a mixture of T and B cells with no abnormal T cell phenotype or monoclonal B cell population. Immunohistochemical stains were performed including CD20, CD79a, BCL-2, BCL-6, CD3, CD4, CD5, CD8, CD10, CD21, CD30, CD43, CD45RO, CD138, kappa, and lambda with appropriate controls on block 1B. The stains show predominance of T cells in the background including in areas of  perinodal infiltration with positivity for CD3, CD5, CD43, and CD45RO. T cells show a mixture of CD4 and CD8 positive cells, although there is predominance of CD4 positive cells. In this background, there are numerous variably sized clusters of B cells as seen with CD20 and CD79a. CD21 highlights variably sized dendritic networks throughout the lymph node including areas of expanded and irregular networks. CD138 highlights the plasma cell component in the lymph node, which shows polyclonal staining pattern for kappa and lambda light chains. BCL-2 is diffusely positive in T and B cell areas (nonspecific). BCL-6 shows diffuse patchy weak positivity throughout including in "T cell areas". No significant CD10 positivity is appreciated. The overall histiologic and immunophenotypic features are worrisome for a lymphoproliferative process, particularly  angioimmunoblastic T cell lymphoma, especially given the clinical history of diffuse lymphadenopathy, hypercalcemia, and skin rashes. The differential diagnosis includes and a B-cell lymphoproliferative process particularly marginal zone lymphoma but given the overall pattern and lack of B-cell clonality by flow cytometric studies makes this diagnosis unlikely. Gene rearrangement studies will be performed to further assess the clonality of the tissue and the results will be reported in an addendum. The results were discussed with Dr. Burr Medico on 05/09/2015. (BNS:ds 05/08/15)   Studies:  No results found.  Assessment:  81 y.o. with past medical history of CHF, otherwise healthy and physically active, presented with diffuse adenopathy since August 2015, previous inguinal biopsy was negative for malignancy. PET scan in 06/14/2014 showed mild hypermetabolic adenopathy in the neck, chest, abdomen and pelvis, and a 6 cm low attenuation lesion in posterior right hepatic lobe, with only minimal metabolic activity in the peripheral of the lesion.   1. Diffuse  adenopathy, suspicious for angioimmunoblastic T-cell lymphoma (AITL) 2. Hypercalcemia, improved  3. Non-hypermetabolic liver lesion, unknown etiology  4. Anemia 5. AKI, improving  4. CHF  5. Altered mental status likely secondary to hypercalcemia   Plan:  -I had a family meeting with patient, her husband, daughters, son, granddaughters, son-in-law today -I reviewed her lymph node biopsy pathology findings. This is highly suspicious for peripheral T-cell lymphoma, particularly autoimmunoblastic T-cell lymphoma, given her clinical presentation, morphological and immunostain results. Gene rearrangement test is still pending, which will be very helpful to make the final diagnosis. The test result will likely return in a few weeks.  -The natural history and poor prognosis of AITL was reviewed with them, and potential treatment option of chemotherapy with CHOP were discussed with them. Given her advanced age and poor performance status, she may not be a good candidate for full dose chemotherapy. -I recommend to wait for the final gene rearrangement test result, and follow-up with her primary oncologist Dr. Alen Blew in a few weeks to finalize this diagnosis and treatment planning. -I answered all their questions -I recommend her to focus on rehabilitation for now, and return to our clinic in a week or two.   I'll remain to be available tomorrow if they have further questions.   Truitt Merle, MD 05/09/2015  6:27 PM

## 2015-05-09 NOTE — Progress Notes (Signed)
Patient ID: Connie Mccann, female   DOB: February 10, 1931, 81 y.o.   MRN: [id number]  [facility] PROGRESS NOTE  TONDRA REIERSON DDU:[id number] DOB: 1931-01-17 DOA: 05/01/2015 PCP: Iran Planas, PA-C   Brief narrative:    Patient is 81 year old female with chronic diastolic CHF, grade 1 based on 2-D echo in 2015, dementia, presented to [facility] emergency department with main concern of several weeks duration off progressively worsening fatigue and weakness, poor oral intake, has been mostly bed bound per husband who is present at the bedside at the time of the admission. Patient explains that at baseline she is typically ambulatory and likes to move around but hasn't been able to do so in few weeks. She reports seeing her primary care provider week prior to this admission and was found to be anemic, she was instructed to take iron supplements which she did but her symptoms have not improved. Patient reports subjective fevers and chills at home, mixed episodes and productive and nonproductive cough, she feels like she has lost some weight but is unsure how much. She denies recent sick contacts or exposures, no recent traveling, no specific abdominal concerns, no specific urinary concerns. Patient also denies any specific focal neurological symptoms such as numbness, tingling, no visual changes or headaches, no changes in sensation.  In emergency department, vital signs stable, patient is hemodynamically stable, blood work notable for hemoglobin 11, creatinine 2.34, calcium greater than 15. Chest x-ray concerning for developing pneumonia. [facility] asked to admit to telemetry bed for further evaluation.  Please note that hospital course has been complicated by persistent hypercalcemia very slow to respond to Fluids, IV Lasix, Pamidronate. As of 7/12 and 7/13, calcium stabilizing and trending towards normal range. Pt is status post right axillary LN biopsy (done 7/11 by Dr. Hassell Done and biopsy report still  pending). Oncologist D.r Burr Medico is following and with discuss further recommendations once final report on LN biopsy is back.   Please also note that grandson requested to update him, pt's husband, or daughter Georga Kaufmann, no other family members.   Assessment/Plan:    Active Problems: Generalized weakness - determined to be secondary to progressive failure to thrive and deconditioning in the setting of what appears to be progressive lymphoma, subsequent malignancy induced hypercalcemia, also lobar PNA, imposed on known dementia  - pt completed course of ABX Unasyn and Zithromax for 7 days (see below) - pt is clinically improving but still intermittently confused and per family somewhat at her baseline  - Encouraged oral intake as patient able to tolerate - also encouraged ambulation, preferred at least per shift and at minimum OOB to chair - family is in agreement with placement to SNF which pt will certainly need upon discharge   Community acquired pneumonia, unknown pathogen, retrocardiac area - Sputum analysis, culture, urine Legionella and strep pneumo, negative to date  - Continued Zithromax and Unasyn day #7/7, completed 05/07/2015 - IS while awake, pulmonary toiletry, ambulation as tolerating   High risk aspiration - has been refusing to swallow tablets at times, holding under the tongue  - improving overall but still with intermittent confusion  - thin diet recommended and pt tolerating well, still with poor oral intake   Severe hypercalcemia on admission > 15, malignancy induced  - with current blood work results including intact PTH 9 (low) and Ca >14 (high), CT chest concerning for lymphoma, it is now apparent that hypercalcemia is malignancy related - pt given one dose of Pamidronate 7/8 and Ca is  trending down  - per oncologist, no indication to give another Pamidronate for at least another month  - continue Lasix, please note IVF have been stopped due to pulmonary vascular  congestion and volume overload  - will repeat BMP in AM  Pulmonary vascular congestion secondary to acute on chronic diastolic CHF, grade I  - exacerbated by IVF that pt has received for treatment of hypercalcemia as well as underlying moderate hypoalbuminemia   - last 2 D ECHO was done 09/2014 and was notable for normal and preserved EF with grade I diastolic CHF - no need for repeat ECHO at this to time - we have discontinued IVF 7/11 and weight is so far slightly trending down from 64 kg to 62 kg - continue Lasix as noted above and monitor clinical response  - monitor daily weights, strict I/O  Bilateral upper extremity edema - doppler negative for DVT  Extensive lymphadenopathy  - noted on CT abd and pelvis in August 2015, also noted new 7 mm left upper lobe pulmonary nodule. - CT chest done 7/8, results below, d/w family concern for lymphoma - d/w Dr. Burr Medico, pt is now s/p right axillary LN biopsy (done 7/11), awaiting for path report  - per Dr. Burr Medico, preliminary findings concerning for T-cell lymphoproliferative disorder, especially angioimmunoblastic T-cell lymphoma - still waiting for the flow cytometry and is immunostaining to confirm - further recommendations pending LN biopsy results  - we appreciate Dr. Burr Medico assistance   New 7 mm left upper lobe pulmonary nodule - noted on recent CT chest - not clear if related to possible lymphoma or underlying bronchogenic carcinoma - hold off on current work up until LN biopsy if back   6 cm posterior right hepatic lobe lesion  - noted on PET scan 05/2014 and with evidence of metabolic activity suspicious for neoplastic process  - recommended MRI abd has not been dose as pt was unable to complete exam, unable to hold breath long enough - further work up pending LN biopsy as noted above .  Acute metabolic encephalopathy - Likely secondary to community-acquired pneumonia, hypocalcemia, acute on chronic renal failure and progressive failure  to thrive  - slowly improving but given underling dementia, not clear how much better will she get in terms of mental status   Acute on chronic renal failure, stage II - Based on GFR patient meets criteria for stage II chronic kidney failure - Baseline creatinine in the past 6 months has been 0.9-1.3 - Higher on this admission ~2 and likely prerenal in etiology from poor oral intake and dehydration, hypercalcemia  - holding losartan that pt has been taking at home - renal US with no hydro and suggestive of chronic renal disease - Cr is slowly trending down even with being on Lasix  - will repeat BMP in AM  Anemia of chronic disease, microcytic, chronic process from lymphadenopathy, ? Lymphoma  - Recent anemia panel notable for iron level 46 (normal), TIBC 152 (low) c/w anemia of chronic disease - ferritin requested and still pending  - No signs of active bleeding - Repeat CBC in the morning  Moderate malnutrition - In the setting of ongoing illness outlined above - Nutritionist consulted, recommendations appreciated   Hypokalemia - from Lasix - continue to supplement and repeat BMP in AM  Acute leukopenia - from acute illness, WBC now WNL  - monitor closely   Acute functional quadriplegia - OOB to chair at least per shift  DVT prophylaxis - Lovenox SQ  Code Status: Full.  Family Communication:  plan of care discussed with the patient and grandson at bedside  Disposition Plan: Family in agreement with SNF when medically ready   IV access:  PICC line placed 05/04/2015 as pt was difficult stick  Procedures and diagnostic studies:    Dg Chest 2 View 05/01/2015   Retrocardiac atelectasis or pneumonia with probable small effusion. Followup PA and lateral chest X-ray is recommended in 3-4 weeks following trial of antibiotic therapy to ensure resolution.     Ct Chest Wo Contrast 05/03/2015  Interval progression of bilateral axillary adenopathy concerning for recurrent lymphoma. 2.  New bilateral pleural effusions and diffuse subcutaneous edema suggesting congestive heart failure and anasarca. 3. New 7 mm left upper lobe pulmonary nodule. If the patient is at high risk for bronchogenic carcinoma, follow-up chest CT at 3-31months is recommended. If the patient is at low risk for bronchogenic carcinoma, follow-up chest CT at 6-12 months is recommended. This recommendation follows the consensus statement: Guidelines for Management of Small Pulmonary Nodules Detected on CT Scans:   US Renal 05/02/2015   No evidence of hydronephrosis. 2. Mildly increased renal parenchymal echogenicity raises concern for medical renal disease. 3. Small left renal cyst noted.     Right axillary LN biopsy by Dr. Hassell Done 05/06/2015 - biopsy report still pendng   Medical Consultants:  Oncologist Dr. Burr Medico  IR - signed off on 7/10 as surgery has taken over to perform excisional LN biopsy  Surgery - signed off 7/13  Other Consultants:  PT - recommend SNF once medically stable  Nutritionist - determined pt with moderate malnutrition  SLP - recommended thin diet  Pharmacist - ABX monitoring   IAnti-Infectives:   Zithromax 7/6 --> 7/12 Unasyn 7/6 --> 7/12  Faye Ramsay, MD  [facility] Pager [phone]  If 7PM-7AM, please contact night-coverage [email or web address] Password [facility] 05/09/2015, 2:51 PM   LOS: 8 days   HPI/Subjective: No events overnight. Pt reports feeling better, denies pain, still with very poor oral intake.   Objective: Filed Vitals:   05/08/15 2007 05/08/15 2021 05/09/15 0530 05/09/15 0822  BP:  160/56 148/44   Pulse:  99 102   Temp:  98.6 F (37 C) 98.8 F (37.1 C)   TempSrc:  Oral Oral   Resp:  20 18   Height:      Weight:   62.506 kg (137 lb 12.8 oz)   SpO2: 95% 100% 98% 97%    Intake/Output Summary (Last 24 hours) at 05/09/15 1451 Last data filed at 05/09/15 0700  Gross per 24 hour  Intake    480 ml  Output    200 ml  Net    280 ml    Exam:   General:  Pt is  more alert, follows commands appropriately, intermittently confused   Cardiovascular: Regular rhythm, tachycardic, S1/S2, no murmurs, no rubs, no gallops  Respiratory: Clear to auscultation bilaterally, no wheezing, diminished breath sounds at bases   Abdomen: Soft, non tender, non distended, bowel sounds present, no guarding  Extremities: pulses DP and PT palpable bilaterally, trace bilateral LE pitting edema, bilateral UE edema improving   Data Reviewed: Basic Metabolic Panel:  Recent Labs Lab 05/05/15 0341 05/06/15 0356 05/07/15 0443 05/08/15 0510 05/09/15 0450  NA 140 138 143 144 142  K 3.2* 3.5 3.2* 3.1* 3.2*  CL 108 106 110 110 111  CO2 27 27 27 27 27   GLUCOSE 100* 122* 91 94 109*  BUN 36* 41* 42* 42* 41*  CREATININE 2.01* 2.01* 2.01* 1.81* 1.66*  CALCIUM 12.6* 13.0* 11.5* 11.4* 11.4*   CBC:  Recent Labs Lab 05/04/15 0530 05/05/15 0341 05/06/15 0356 05/07/15 0443 05/08/15 0510 05/09/15 0450  WBC 4.2 3.8* 4.2 3.8* 4.1 4.0  NEUTROABS 2.2  --   --   --   --   --   HGB 10.5* 10.5* 10.6* 9.9* 10.4* 10.5*  HCT 32.4* 32.6* 32.3* 31.5* 32.4* 32.1*  MCV 76.2* 76.3* 76.7* 76.6* 77.3* 75.9*  PLT 235 252 235 191 192 165     Recent Results (from the past 240 hour(s))  Culture, blood (routine x 2) Call MD if unable to obtain prior to antibiotics being given     Status: None   Collection Time: 05/01/15  9:50 AM  Result Value Ref Range Status   Specimen Description BLOOD RIGHT WRIST  Final   Special Requests BOTTLES DRAWN AEROBIC AND ANAEROBIC 5CC  Final   Culture   Final    NO GROWTH 5 DAYS Performed at [facility]    Report Status 05/06/2015 FINAL  Final  Culture, blood (routine x 2) Call MD if unable to obtain prior to antibiotics being given     Status: None   Collection Time: 05/01/15 10:00 AM  Result Value Ref Range Status   Specimen Description BLOOD RIGHT ARM  Final   Special Requests BOTTLES DRAWN AEROBIC ONLY [place]  Final   Culture   Final    NO  GROWTH 5 DAYS Performed at [facility]    Report Status 05/06/2015 FINAL  Final  Surgical pcr screen     Status: None   Collection Time: 05/06/15  1:09 PM  Result Value Ref Range Status   MRSA, PCR NEGATIVE NEGATIVE Final   Staphylococcus aureus NEGATIVE NEGATIVE Final    Comment:        The Xpert SA Assay (FDA approved for NASAL specimens in patients over 45 years of age), is one component of a comprehensive surveillance program.  Test performance has been validated by [facility] for patients greater than or equal to 86 year old. It is not intended to diagnose infection nor to guide or monitor treatment.      Scheduled Meds: . budesonide (PULMICORT) nebulizer solution  0.25 mg Nebulization BID  . enoxaparin (LOVENOX) injection  30 mg Subcutaneous Q24H  . feeding supplement (ENSURE ENLIVE)  237 mL Oral BID BM  . ferrous sulfate  325 mg Oral BID WC  . fluticasone  2 spray Each Nare Daily  . furosemide  20 mg Intravenous Daily  . potassium chloride  10 mEq Intravenous Q1 Hr x 4   Continuous Infusions:

## 2015-05-09 NOTE — Progress Notes (Signed)
*  PRELIMINARY RESULTS* Vascular Ultrasound Upper extremity venous duplex has been completed.  Preliminary findings: No evidence of DVT bilaterally. Multiple enlarged lymph nodes are noted in bilateral subclavicular areas and the left axilla.   Landry Mellow, RDMS, RVT  05/09/2015, 1:54 PM

## 2015-05-09 NOTE — Progress Notes (Signed)
Physical Therapy Treatment Patient Details Name: Connie Mccann MRN: [id number] DOB: 06-03-1931 Today's Date: 05/09/2015    History of Present Illness 81 year old female with hx of chronic diastolic CHF and HTN admitted for generalized weakness and CAP. Pt s/p Right lymph node biopsy 7/11    PT Comments    Assisted pt OOB to amb to bathroom.  Very unsteady requiring assist to safely move walker esp around obstacles.  Assisted with hygiene. Amb in hallway.  Very unsteady/drunken gait. HIGH FALL RISK.    Follow Up Recommendations  SNF     Equipment Recommendations       Recommendations for Other Services       Precautions / Restrictions Restrictions Weight Bearing Restrictions: No    Mobility  Bed Mobility Overal bed mobility: Needs Assistance Bed Mobility: Supine to Sit     Supine to sit: Min assist     General bed mobility comments: verbal cues for technique, assist for trunk upright   Transfers Overall transfer level: Needs assistance Equipment used: Rolling walker (2 wheeled) Transfers: Sit to/from Stand Sit to Stand: Min assist;Mod assist         General transfer comment: verbal cues for safe technique, assist to rise and steady  Ambulation/Gait Ambulation/Gait assistance: Min assist;Mod assist Ambulation Distance (Feet): 45 Feet Assistive device: Rolling walker (2 wheeled) Gait Pattern/deviations: Step-to pattern;Step-through pattern;Drifts right/left;Trunk flexed     General Gait Details: verbal cues for step length, RW positioning, posture, requiring assist to steady and maneuver RW, distance per pt tolerance   Stairs            Wheelchair Mobility    Modified Rankin (Stroke Patients Only)       Balance                                    Cognition Arousal/Alertness: Awake/alert Behavior During Therapy: Flat affect Overall Cognitive Status: Within Functional Limits for tasks assessed                       Exercises      General Comments        Pertinent Vitals/Pain Pain Assessment: No/denies pain    Home Living                      Prior Function            PT Goals (current goals can now be found in the care plan section) Progress towards PT goals: Progressing toward goals    Frequency  Min 3X/week    PT Plan      Co-evaluation             End of Session Equipment Utilized During Treatment: Gait belt Activity Tolerance: Patient limited by fatigue Patient left: in chair;with call bell/phone within reach     Time: 9767-3419 PT Time Calculation (min) (ACUTE ONLY): 24 min  Charges:  $Gait Training: 8-22 mins $Therapeutic Activity: 8-22 mins                    G Codes:      Rica Koyanagi  PTA [facility]  Acute  Rehab Pager      [phone]

## 2015-05-10 ENCOUNTER — Encounter (HOSPITAL_COMMUNITY): Payer: Self-pay | Admitting: Internal Medicine

## 2015-05-10 DIAGNOSIS — J189 Pneumonia, unspecified organism: Secondary | ICD-10-CM

## 2015-05-10 DIAGNOSIS — E876 Hypokalemia: Secondary | ICD-10-CM | POA: Diagnosis present

## 2015-05-10 DIAGNOSIS — G9340 Encephalopathy, unspecified: Secondary | ICD-10-CM | POA: Diagnosis present

## 2015-05-10 DIAGNOSIS — R5383 Other fatigue: Secondary | ICD-10-CM

## 2015-05-10 DIAGNOSIS — R21 Rash and other nonspecific skin eruption: Secondary | ICD-10-CM | POA: Diagnosis present

## 2015-05-10 DIAGNOSIS — C8590 Non-Hodgkin lymphoma, unspecified, unspecified site: Secondary | ICD-10-CM

## 2015-05-10 DIAGNOSIS — E440 Moderate protein-calorie malnutrition: Secondary | ICD-10-CM

## 2015-05-10 HISTORY — DX: Non-Hodgkin lymphoma, unspecified, unspecified site: C85.90

## 2015-05-10 LAB — CBC WITH DIFFERENTIAL/PLATELET
BASOS PCT: 1 % (ref 0–1)
Basophils Absolute: 0 10*3/uL (ref 0.0–0.1)
Eosinophils Absolute: 0.1 10*3/uL (ref 0.0–0.7)
Eosinophils Relative: 3 % (ref 0–5)
HCT: 34.5 % — ABNORMAL LOW (ref 36.0–46.0)
HEMOGLOBIN: 11 g/dL — AB (ref 12.0–15.0)
LYMPHS ABS: 1 10*3/uL (ref 0.7–4.0)
LYMPHS PCT: 28 % (ref 12–46)
MCH: 24.7 pg — ABNORMAL LOW (ref 26.0–34.0)
MCHC: 31.9 g/dL (ref 30.0–36.0)
MCV: 77.5 fL — ABNORMAL LOW (ref 78.0–100.0)
MONO ABS: 0.7 10*3/uL (ref 0.1–1.0)
Monocytes Relative: 19 % — ABNORMAL HIGH (ref 3–12)
Neutro Abs: 1.8 10*3/uL (ref 1.7–7.7)
Neutrophils Relative %: 49 % (ref 43–77)
PLATELETS: 203 10*3/uL (ref 150–400)
RBC: 4.45 MIL/uL (ref 3.87–5.11)
RDW: 16.1 % — AB (ref 11.5–15.5)
WBC: 3.6 10*3/uL — AB (ref 4.0–10.5)

## 2015-05-10 LAB — BASIC METABOLIC PANEL
ANION GAP: 4 — AB (ref 5–15)
BUN: 39 mg/dL — ABNORMAL HIGH (ref 6–20)
CO2: 26 mmol/L (ref 22–32)
Calcium: 11.4 mg/dL — ABNORMAL HIGH (ref 8.9–10.3)
Chloride: 113 mmol/L — ABNORMAL HIGH (ref 101–111)
Creatinine, Ser: 1.63 mg/dL — ABNORMAL HIGH (ref 0.44–1.00)
GFR calc Af Amer: 33 mL/min — ABNORMAL LOW (ref >60)
GFR calc non Af Amer: 28 mL/min — ABNORMAL LOW (ref >60)
GLUCOSE: 93 mg/dL (ref 65–99)
Potassium: 3.6 mmol/L (ref 3.5–5.1)
SODIUM: 143 mmol/L (ref 135–145)

## 2015-05-10 MED ORDER — LORATADINE 5 MG/5ML PO SYRP
10.0000 mg | ORAL_SOLUTION | Freq: Every day | ORAL | Status: DC
  Administered 2015-05-10 – 2015-05-12 (×3): 10 mg via ORAL
  Filled 2015-05-10 (×3): qty 10

## 2015-05-10 MED ORDER — FAMOTIDINE IN NACL 20-0.9 MG/50ML-% IV SOLN: 20.0000 mg | Freq: Two times a day (BID) | INTRAVENOUS | Status: DC

## 2015-05-10 MED ORDER — FAMOTIDINE IN NACL 20-0.9 MG/50ML-% IV SOLN
20.0000 mg | Freq: Every day | INTRAVENOUS | Status: DC
  Administered 2015-05-10 – 2015-05-12 (×3): 20 mg via INTRAVENOUS
  Filled 2015-05-10 (×3): qty 50

## 2015-05-10 MED ORDER — MEGESTROL ACETATE 400 MG/10ML PO SUSP
800.0000 mg | Freq: Every day | ORAL | Status: DC
  Administered 2015-05-10 – 2015-05-12 (×3): 800 mg via ORAL
  Filled 2015-05-10 (×4): qty 20

## 2015-05-10 MED ORDER — CALCITONIN (SALMON) 200 UNIT/ACT NA SOLN
1.0000 | Freq: Every day | NASAL | Status: DC
  Administered 2015-05-10 – 2015-05-11 (×2): 1 via NASAL
  Filled 2015-05-10: qty 3.7

## 2015-05-10 MED ORDER — CETIRIZINE HCL 5 MG/5ML PO SYRP: 10.0000 mg | ORAL_SOLUTION | Freq: Every day | ORAL | Status: DC

## 2015-05-10 NOTE — Progress Notes (Signed)
Progress Note   Connie Mccann DTO:[id number] DOB: 1931/03/13 DOA: 05/01/2015 PCP: Iran Planas, PA-C   Brief Narrative:   Connie Mccann is an 81 y.o. female with a PMH of grade 1 chronic diastolic CHF, dementia, newly diagnosed lymphoid disorder, suspicious for peripheral T-cell lymphoma who was admitted 05/01/15  Assessment/Plan:   Active Problems: Generalized weakness secondary to failure to thrive in the setting of newly diagnosed lymphoma - Determined to be secondary to progressive failure to thrive and deconditioning in the setting of what appears to be progressive lymphoma, subsequent malignancy induced hypercalcemia, also lobar PNA, imposed on known dementia.  - S/P course of ABX Unasyn and Zithromax for 7 days (see below). - Encouraged oral intake as patient able to tolerate, started Megace to stimulate appetite. - For SNF.  Rash  - Questionable adverse drug reaction. Start Pepcid and Claritin.  Community acquired pneumonia, unknown pathogen, retrocardiac area - Sputum analysis, culture, urine Legionella and strep pneumo, negative to date.  - S/P Zithromax and Unasyn, completed a seven-day course on 05/07/2015. - IS while awake, pulmonary toiletry, ambulation as tolerated.   High risk aspiration - The patient has been refusing to swallow tablets at times, pocketing food and pills.  - Continue aspiration precautions.  Severe hypercalcemia on admission > 15, malignancy induced  - Intact PTH 9 (low) and Ca >14 (high). - CT chest concerning for lymphoma, it is now apparent that hypercalcemia is malignancy related. - Status post one dose of Pamidronate 05/03/15 and although calcium has trended down, has not normalized. Start calcitonin. - Can consider repeating pamidronate one month after first dose. - Continue Lasix, please note IVF have been stopped due to pulmonary vascular congestion and volume overload.   Pulmonary vascular congestion secondary to acute on  chronic diastolic CHF, grade I  - Exacerbated by IVF that pt has received for treatment of hypercalcemia as well as underlying moderate hypoalbuminemia.  - 2 D ECHO was done 09/2014 and was notable for normal and preserved EF with grade I diastolic CHF. - IV fluids discontinued, continue to monitor I/O and daily weights. Currently appears to be compensated.  Bilateral upper extremity edema - No evidence of DVT based on Doppler studies.  Lymphoma with Extensive lymphadenopathy  - Noted on CT abd and pelvis in August 2015, also noted new 7 mm left upper lobe pulmonary nodule. - CT chest done 7/8, results below, d/w family concern for lymphoma. - S/P right axillary LN biopsy (done 7/11), awaiting final path report, but initial report concerning for lymphoma. - Per Dr. Burr Medico, preliminary findings concerning for T-cell lymphoproliferative disorder, especially angioimmunoblastic T-cell lymphoma. - Follow-up flow cytometry and iimmunostaining to confirm.  New 7 mm left upper lobe pulmonary nodule - Noted on recent CT chest. - Not clear if related to possible lymphoma or underlying bronchogenic carcinoma.  6 cm posterior right hepatic lobe lesion  - Noted on PET scan 05/2014 and with evidence of metabolic activity suspicious for neoplastic process.  - Recommended MRI abd has not been done as pt was unable to complete exam, unable to hold breath long enough.  Acute metabolic encephalopathy - Likely secondary to community-acquired pneumonia, hypocalcemia, acute on chronic renal failure and progressive failure to thrive. - Would continue to attempt to bring calcium levels down as she has not significantly improved.  Acute on chronic renal failure, stage II - Based on GFR patient meets criteria for stage II chronic kidney failure. - Baseline creatinine in the past  6 months has been 0.9-1.3. - Higher on this admission ~2 and likely prerenal in etiology from poor oral intake and dehydration,  hypercalcemia.  - Continue holding losartan. - Status post renal US with no hydro and suggestive of chronic renal disease. - Cr is slowly trending down even with being on Lasix.   Anemia of chronic disease, microcytic, chronic process from lymphadenopathy, ? Lymphoma  - Recent anemia panel notable for iron level 46 (normal), TIBC 152 (low) c/w anemia of chronic disease. Ferritin 207. - No signs of active bleeding. Findings consistent with anemia of chronic disease.  Moderate malnutrition - In the setting of ongoing illness outlined above. - Nutritionist consulted, recommendations appreciated   Hypokalemia - Secondary to Lasix. - continue to supplement.  Acute leukopenia - Resolved.  Acute functional quadriplegia - OOB to chair at least per shift.  DVT prophylaxis - Lovenox SQ  Family Communication: Husband and grandson updated at the bedside. Disposition Plan: SNF when mental status improved and oral intake adequate. Code Status:     Code Status Orders        Start     Ordered   05/06/15 1655  Full code   Continuous     05/06/15 1654        IV Access:    Peripheral IV   Procedures and diagnostic studies:   Dg Chest 2 View  05/01/2015   CLINICAL DATA:  Fatigue.  Loss of appetite.  EXAM: CHEST  2 VIEW  COMPARISON:  10/20/2014  FINDINGS: There is a retrocardiac opacity which was not seen previously. The opacity obscures the left diaphragm. There is likely a small associated pleural effusion. Associated lucency is likely residual aerated lung.  Borderline cardiomegaly.  Negative aortic and hilar contours.  IMPRESSION: Retrocardiac atelectasis or pneumonia with probable small effusion. Followup PA and lateral chest X-ray is recommended in 3-4 weeks following trial of antibiotic therapy to ensure resolution.   Electronically Signed   By: Monte Fantasia M.D.   On: 05/01/2015 07:00   Ct Chest Wo Contrast  05/03/2015   CLINICAL DATA:  Shortness of breath. Pleural  effusion. Asthma. Lethargy.  EXAM: CT CHEST WITHOUT CONTRAST  TECHNIQUE: Multidetector CT imaging of the chest was performed following the standard protocol without IV contrast.  COMPARISON:  CT chest 10/20/2014  FINDINGS: The heart is enlarged. Coronary artery calcifications are present. Prevascular lymph nodes are slightly more prominent than on the prior study. There is is increase in size of bilateral axillary lymph nodes. The largest right-sided node measures 15 x 24 x 30 mm. The largest left-sided node is 16 x 24 mm. A right supraclavicular node is increased in size from 14 day 18 mm in short axis.  Moderate bilateral pleural effusions are present. Bilateral hilar adenopathy is evident. The  There is atelectasis associated with the effusions bilaterally. A 7 mm left upper lobe pulmonary nodule is new. No other focal nodule is present.  The bone windows are unremarkable. There is moderate edema within the subcutaneous soft tissues of the chest.  Limited imaging of the upper abdomen demonstrates a focal lesion in the posterior right liver most compatible with a hemangioma.  IMPRESSION: 1. Interval progression of bilateral axillary adenopathy concerning for recurrent lymphoma. 2. New bilateral pleural effusions and diffuse subcutaneous edema suggesting congestive heart failure and anasarca. 3. New 7 mm left upper lobe pulmonary nodule. If the patient is at high risk for bronchogenic carcinoma, follow-up chest CT at 3-35months is recommended. If the patient  is at low risk for bronchogenic carcinoma, follow-up chest CT at 6-12 months is recommended. This recommendation follows the consensus statement: Guidelines for Management of Small Pulmonary Nodules Detected on CT Scans: A Statement from the [place] as published in Radiology 2005; 237:395-400. 4. Stable cardiomegaly and coronary artery disease.   Electronically Signed   By: San Morelle M.D.   On: 05/03/2015 17:21   US Renal  05/02/2015    CLINICAL DATA:  Acute onset of renal failure.  Initial encounter.  EXAM: RENAL / URINARY TRACT ULTRASOUND COMPLETE  COMPARISON:  CT of the abdomen and pelvis performed 10/20/2014  FINDINGS: Right Kidney:  Length: 8.9 cm. Mildly increased parenchymal echogenicity noted. No mass or hydronephrosis visualized.  Left Kidney:  Length: 9.9 cm. Mildly increased parenchymal echogenicity noted. No hydronephrosis visualized. A 1.6 cm cyst is noted at the interpole region of the left kidney.  Bladder:  Not characterized on this study.  IMPRESSION: 1. No evidence of hydronephrosis. 2. Mildly increased renal parenchymal echogenicity raises concern for medical renal disease. 3. Small left renal cyst noted.   Electronically Signed   By: Garald Balding M.D.   On: 05/02/2015 21:30     Medical Consultants:    None.  Anti-Infectives:   Anti-infectives    Start     Dose/Rate Route Frequency Ordered Stop   05/06/15 1300  ceFAZolin (ANCEF) IVPB 2 g/50 mL premix  Status:  Discontinued    Comments:  Pharmacy may adjust dosing strength, interval, or rate of medication as needed for optimal therapy for the patient  Send with patient on call to the OR.  Anesthesia to complete antibiotic administration <28min prior to incision per [facility].   2 g 100 mL/hr over 30 Minutes Intravenous On call to O.R. 05/06/15 1254 05/07/15 1419   05/05/15 1200  azithromycin (ZITHROMAX) tablet 500 mg     500 mg Oral Daily 05/05/15 0924 05/07/15 0911   05/01/15 2200  ampicillin-sulbactam (UNASYN) 1.5 g in sodium chloride 0.9 % 50 mL IVPB  Status:  Discontinued     1.5 g 100 mL/hr over 30 Minutes Intravenous Every 12 hours 05/01/15 1442 05/07/15 1419   05/01/15 1100  azithromycin (ZITHROMAX) 500 mg in dextrose 5 % 250 mL IVPB  Status:  Discontinued     500 mg 250 mL/hr over 60 Minutes Intravenous Every 24 hours 05/01/15 0920 05/05/15 0924   05/01/15 1000  cefTRIAXone (ROCEPHIN) 1 g in dextrose 5 % 50 mL IVPB - Premix  Status:   Discontinued     1 g 100 mL/hr over 30 Minutes Intravenous Every 24 hours 05/01/15 0920 05/01/15 1442      Subjective:   Connie Mccann is largely unresponsive and somnolent.  Objective:    Filed Vitals:   05/09/15 1910 05/09/15 2016 05/10/15 0526 05/10/15 0840  BP: 129/44 158/51 159/53 154/54  Pulse:  114 106 110  Temp:  98.8 F (37.1 C) 98.2 F (36.8 C) 98.3 F (36.8 C)  TempSrc:  Oral Oral Oral  Resp:  18 18 18   Height:      Weight:   60.51 kg (133 lb 6.4 oz)   SpO2:  100% 93% 100%    Intake/Output Summary (Last 24 hours) at 05/10/15 0913 Last data filed at 05/09/15 1911  Gross per 24 hour  Intake    240 ml  Output      5 ml  Net    235 ml    Exam: Gen:  Somnolent Cardiovascular:  Tachy, II/VI SEM Respiratory:  Lungs CTAB Gastrointestinal:  Abdomen soft, winces to palpation mid abdomen, + BS Extremities:  Trace edema Skin: Diffuse blotchy macular rash  Data Reviewed:    Labs: Basic Metabolic Panel:  Recent Labs Lab 05/06/15 0356 05/07/15 0443 05/08/15 0510 05/09/15 0450 05/10/15 0340  NA 138 143 144 142 143  K 3.5 3.2* 3.1* 3.2* 3.6  CL 106 110 110 111 113*  CO2 27 27 27 27 26   GLUCOSE 122* 91 94 109* 93  BUN 41* 42* 42* 41* 39*  CREATININE 2.01* 2.01* 1.81* 1.66* 1.63*  CALCIUM 13.0* 11.5* 11.4* 11.4* 11.4*   GFR Estimated Creatinine Clearance: 22.4 mL/min (by C-G formula based on Cr of 1.63). Liver Function Tests: No results for input(s): AST, ALT, ALKPHOS, BILITOT, PROT, ALBUMIN in the last 168 hours. No results for input(s): LIPASE, AMYLASE in the last 168 hours. No results for input(s): AMMONIA in the last 168 hours. Coagulation profile  Recent Labs Lab 05/05/15 1019  INR 1.17    CBC:  Recent Labs Lab 05/04/15 0530  05/06/15 0356 05/07/15 0443 05/08/15 0510 05/09/15 0450 05/10/15 0340  WBC 4.2  < > 4.2 3.8* 4.1 4.0 3.6*  NEUTROABS 2.2  --   --   --   --   --  1.8  HGB 10.5*  < > 10.6* 9.9* 10.4* 10.5* 11.0*  HCT  32.4*  < > 32.3* 31.5* 32.4* 32.1* 34.5*  MCV 76.2*  < > 76.7* 76.6* 77.3* 75.9* 77.5*  PLT 235  < > 235 191 192 165 203  < > = values in this interval not displayed.  Anemia work up:  Recent Labs  05/08/15 1700  FERRITIN 207   Sepsis Labs:  Recent Labs Lab 05/07/15 0443 05/08/15 0510 05/09/15 0450 05/10/15 0340  WBC 3.8* 4.1 4.0 3.6*   Microbiology Recent Results (from the past 240 hour(s))  Culture, blood (routine x 2) Call MD if unable to obtain prior to antibiotics being given     Status: None   Collection Time: 05/01/15  9:50 AM  Result Value Ref Range Status   Specimen Description BLOOD RIGHT WRIST  Final   Special Requests BOTTLES DRAWN AEROBIC AND ANAEROBIC 5CC  Final   Culture   Final    NO GROWTH 5 DAYS Performed at [facility]    Report Status 05/06/2015 FINAL  Final  Culture, blood (routine x 2) Call MD if unable to obtain prior to antibiotics being given     Status: None   Collection Time: 05/01/15 10:00 AM  Result Value Ref Range Status   Specimen Description BLOOD RIGHT ARM  Final   Special Requests BOTTLES DRAWN AEROBIC ONLY [place]  Final   Culture   Final    NO GROWTH 5 DAYS Performed at [facility]    Report Status 05/06/2015 FINAL  Final  Surgical pcr screen     Status: None   Collection Time: 05/06/15  1:09 PM  Result Value Ref Range Status   MRSA, PCR NEGATIVE NEGATIVE Final   Staphylococcus aureus NEGATIVE NEGATIVE Final    Comment:        The Xpert SA Assay (FDA approved for NASAL specimens in patients over 70 years of age), is one component of a comprehensive surveillance program.  Test performance has been validated by [facility] for patients greater than or equal to 68 year old. It is not intended to diagnose infection nor to guide or monitor treatment.  Medications:   . budesonide (PULMICORT) nebulizer solution  0.25 mg Nebulization BID  . enoxaparin (LOVENOX) injection  30 mg Subcutaneous Q24H  .  feeding supplement (ENSURE ENLIVE)  237 mL Oral BID BM  . ferrous sulfate  325 mg Oral BID WC  . fluticasone  2 spray Each Nare Daily  . furosemide  10 mg Intravenous Daily   Continuous Infusions:   Time spent: 35 minutes.  The patient is medically complex and requires high complexity decision making.    LOS: 9 days   [facility] Pager [phone]. If unable to reach me by pager, please call my cell phone at [phone].  *Please refer to [email or web address], password [email or web address] to get updated schedule on who will round on this patient, as hospitalists switch teams weekly. If 7PM-7AM, please contact night-coverage at [email or web address], password [email or web address] for any overnight needs.  05/10/2015, 9:13 AM

## 2015-05-10 NOTE — Progress Notes (Signed)
CSW continuing to follow.    CSW met with pt, pt spouse, and pt grandson at bedside this morning. CSW discussed with pt and pt family that CSW was going to re-initiate SNF search with updated information in order for pt family to have most accurate bed offers. Pt family expressed understanding.  Pt grandson stated that pt family interested in [place] left updated bed offers at bedside this afternoon. Pt husband not present. Voice message left with pt husband to notify of updated bed offers.  Per MD, pt not yet medically ready for discharge and no anticipation of discharge over the weekend.   CSW to continue to follow to provide support and assist with pt discharge planning needs.  Connie Mccann, MSW, [place] [employer] [phone]

## 2015-05-10 NOTE — Progress Notes (Signed)
   05/10/15 0830  What Happened  Was fall witnessed? No  Was patient injured? No  Patient found on floor  Found by Staff-comment Doran Heater)  Stated prior activity ambulating-unassisted  Follow Up  MD notified Rama  Time MD notified [place] notified Yes-comment (Husband)  Time family notified [phone]  Additional tests No  Simple treatment Other (comment) (VS taken, Pt assessed, no injuries, pain denied)

## 2015-05-11 LAB — COMPREHENSIVE METABOLIC PANEL
ALBUMIN: 2 g/dL — AB (ref 3.5–5.0)
ALT: 11 U/L — ABNORMAL LOW (ref 14–54)
ANION GAP: 6 (ref 5–15)
AST: 17 U/L (ref 15–41)
Alkaline Phosphatase: 51 U/L (ref 38–126)
BILIRUBIN TOTAL: 0.5 mg/dL (ref 0.3–1.2)
BUN: 42 mg/dL — AB (ref 6–20)
CALCIUM: 11 mg/dL — AB (ref 8.9–10.3)
CHLORIDE: 113 mmol/L — AB (ref 101–111)
CO2: 26 mmol/L (ref 22–32)
CREATININE: 1.66 mg/dL — AB (ref 0.44–1.00)
GFR calc Af Amer: 32 mL/min — ABNORMAL LOW (ref >60)
GFR calc non Af Amer: 28 mL/min — ABNORMAL LOW (ref >60)
Glucose, Bld: 93 mg/dL (ref 65–99)
Potassium: 3.5 mmol/L (ref 3.5–5.1)
Sodium: 145 mmol/L (ref 135–145)
Total Protein: 6.4 g/dL — ABNORMAL LOW (ref 6.5–8.1)

## 2015-05-11 NOTE — Progress Notes (Addendum)
Progress Note   NELDA LUCKEY QQI:[id number] DOB: [birth year] DOA: 05/01/2015 PCP: Iran Planas, PA-C   Brief Narrative:   Connie Mccann is an 81 y.o. female with a PMH of grade 1 chronic diastolic CHF, dementia, newly diagnosed lymphoid disorder, suspicious for peripheral T-cell lymphoma who was admitted 05/01/15.  Assessment/Plan:   Active Problems: Generalized weakness secondary to failure to thrive in the setting of newly diagnosed lymphoma - Determined to be secondary to progressive failure to thrive and deconditioning in the setting of what appears to be progressive lymphoma, subsequent malignancy induced hypercalcemia, also lobar PNA, imposed on known dementia.  - S/P course of ABX Unasyn and Zithromax for 7 days (see below). - Encouraged oral intake as patient able to tolerate, started Megace to stimulate appetite. - For SNF.  More awake and alert today.  Rash  - Questionable adverse drug reaction. Improved with Pepcid and Claritin.  Community acquired pneumonia, unknown pathogen, retrocardiac area - Sputum analysis, culture, urine Legionella and strep pneumo, negative to date.  - S/P Zithromax and Unasyn, completed a seven-day course on 05/07/2015. - IS while awake, pulmonary toiletry, ambulation as tolerated.   High risk aspiration - The patient has been refusing to swallow tablets at times, pocketing food and pills.  - Continue aspiration precautions. ST asked to re-evaluate.  Severe hypercalcemia on admission > 15, malignancy induced  - Intact PTH 9 (low) and Ca >14 (high). - CT chest concerning for lymphoma, it is now apparent that hypercalcemia is malignancy related. - Status post one dose of Pamidronate 05/03/15 and although calcium has trended down, has not normalized. Start calcitonin. - Can consider repeating pamidronate one month after first dose. - Continue Lasix.   Pulmonary vascular congestion secondary to acute on chronic diastolic CHF, grade I  -  Exacerbated by IVF that pt has received for treatment of hypercalcemia as well as underlying moderate hypoalbuminemia.  - 2 D ECHO was done 09/2014 and was notable for normal and preserved EF with grade I diastolic CHF. - IV fluids discontinued, continue to monitor I/O and daily weights. Currently appears to be compensated.  Bilateral upper extremity edema - No evidence of DVT based on Doppler studies.  Lymphoma with Extensive lymphadenopathy  - Noted on CT abd and pelvis in August 2015, also noted new 7 mm left upper lobe pulmonary nodule. - CT chest done 7/8, results below, d/w family concern for lymphoma. - S/P right axillary LN biopsy (done 7/11), awaiting final path report, but initial report concerning for lymphoma. - Per Dr. Burr Medico, preliminary findings concerning for T-cell lymphoproliferative disorder, especially angioimmunoblastic T-cell lymphoma. - Follow-up flow cytometry and iimmunostaining to confirm.  New 7 mm left upper lobe pulmonary nodule - Noted on recent CT chest. - Not clear if related to possible lymphoma or underlying bronchogenic carcinoma.  6 cm posterior right hepatic lobe lesion  - Noted on PET scan 05/2014 and with evidence of metabolic activity suspicious for neoplastic process.  - Recommended MRI abd has not been done as pt was unable to complete exam, unable to hold breath long enough.  Acute metabolic encephalopathy - Likely secondary to CAP, hypocalcemia, acute on chronic renal failure and progressive failure to thrive. - Dramatic improvement in past 24 hours, awake and alert now.  Acute on chronic renal failure, stage II - Based on GFR patient meets criteria for stage II chronic kidney failure. - Baseline creatinine in the past 6 months has been 0.9-1.3. - Higher on this  admission ~2 and likely prerenal in etiology from poor oral intake and dehydration, hypercalcemia.  - Continue holding losartan. - Status post renal US with no hydro and suggestive  of chronic renal disease. - Cr stable at 1.6.   Anemia of chronic disease, microcytic, chronic process from lymphadenopathy, ? Lymphoma  - Recent anemia panel notable for iron level 46 (normal), TIBC 152 (low) c/w anemia of chronic disease. Ferritin 207. - No signs of active bleeding. Findings consistent with anemia of chronic disease.  Moderate malnutrition - In the setting of ongoing illness outlined above. - Nutritionist consulted, recommendations appreciated   Hypokalemia - Secondary to Lasix. - Continue to supplement.  Acute leukopenia - Resolved.  Acute functional quadriplegia - OOB to chair at least per shift.  DVT prophylaxis - Lovenox SQ  Family Communication: Family member updated at the bedside. Disposition Plan: SNF when mental status improved and oral intake adequate, likely 05/12/15 if diet can be advanced pending ST re-evaluation. Code Status:     Code Status Orders        Start     Ordered   05/06/15 1655  Full code   Continuous     05/06/15 1654        IV Access:    Peripheral IV   Procedures and diagnostic studies:   Dg Chest 2 View  05/01/2015   CLINICAL DATA:  Fatigue.  Loss of appetite.  EXAM: CHEST  2 VIEW  COMPARISON:  10/20/2014  FINDINGS: There is a retrocardiac opacity which was not seen previously. The opacity obscures the left diaphragm. There is likely a small associated pleural effusion. Associated lucency is likely residual aerated lung.  Borderline cardiomegaly.  Negative aortic and hilar contours.  IMPRESSION: Retrocardiac atelectasis or pneumonia with probable small effusion. Followup PA and lateral chest X-ray is recommended in 3-4 weeks following trial of antibiotic therapy to ensure resolution.   Electronically Signed   By: Monte Fantasia M.D.   On: 05/01/2015 07:00   Ct Chest Wo Contrast  05/03/2015   CLINICAL DATA:  Shortness of breath. Pleural effusion. Asthma. Lethargy.  EXAM: CT CHEST WITHOUT CONTRAST  TECHNIQUE:  Multidetector CT imaging of the chest was performed following the standard protocol without IV contrast.  COMPARISON:  CT chest 10/20/2014  FINDINGS: The heart is enlarged. Coronary artery calcifications are present. Prevascular lymph nodes are slightly more prominent than on the prior study. There is is increase in size of bilateral axillary lymph nodes. The largest right-sided node measures 15 x 24 x 30 mm. The largest left-sided node is 16 x 24 mm. A right supraclavicular node is increased in size from 14 day 18 mm in short axis.  Moderate bilateral pleural effusions are present. Bilateral hilar adenopathy is evident. The  There is atelectasis associated with the effusions bilaterally. A 7 mm left upper lobe pulmonary nodule is new. No other focal nodule is present.  The bone windows are unremarkable. There is moderate edema within the subcutaneous soft tissues of the chest.  Limited imaging of the upper abdomen demonstrates a focal lesion in the posterior right liver most compatible with a hemangioma.  IMPRESSION: 1. Interval progression of bilateral axillary adenopathy concerning for recurrent lymphoma. 2. New bilateral pleural effusions and diffuse subcutaneous edema suggesting congestive heart failure and anasarca. 3. New 7 mm left upper lobe pulmonary nodule. If the patient is at high risk for bronchogenic carcinoma, follow-up chest CT at 3-75months is recommended. If the patient is at low risk for bronchogenic  carcinoma, follow-up chest CT at 6-12 months is recommended. This recommendation follows the consensus statement: Guidelines for Management of Small Pulmonary Nodules Detected on CT Scans: A Statement from the [place] as published in Radiology 2005; 237:395-400. 4. Stable cardiomegaly and coronary artery disease.   Electronically Signed   By: San Morelle M.D.   On: 05/03/2015 17:21   US Renal  05/02/2015   CLINICAL DATA:  Acute onset of renal failure.  Initial encounter.  EXAM:  RENAL / URINARY TRACT ULTRASOUND COMPLETE  COMPARISON:  CT of the abdomen and pelvis performed 10/20/2014  FINDINGS: Right Kidney:  Length: 8.9 cm. Mildly increased parenchymal echogenicity noted. No mass or hydronephrosis visualized.  Left Kidney:  Length: 9.9 cm. Mildly increased parenchymal echogenicity noted. No hydronephrosis visualized. A 1.6 cm cyst is noted at the interpole region of the left kidney.  Bladder:  Not characterized on this study.  IMPRESSION: 1. No evidence of hydronephrosis. 2. Mildly increased renal parenchymal echogenicity raises concern for medical renal disease. 3. Small left renal cyst noted.   Electronically Signed   By: Garald Balding M.D.   On: 05/02/2015 21:30     Medical Consultants:    None.  Anti-Infectives:   Anti-infectives    Start     Dose/Rate Route Frequency Ordered Stop   05/06/15 1300  ceFAZolin (ANCEF) IVPB 2 g/50 mL premix  Status:  Discontinued    Comments:  Pharmacy may adjust dosing strength, interval, or rate of medication as needed for optimal therapy for the patient  Send with patient on call to the OR.  Anesthesia to complete antibiotic administration <41min prior to incision per [facility].   2 g 100 mL/hr over 30 Minutes Intravenous On call to O.R. 05/06/15 1254 05/07/15 1419   05/05/15 1200  azithromycin (ZITHROMAX) tablet 500 mg     500 mg Oral Daily 05/05/15 0924 05/07/15 0911   05/01/15 2200  ampicillin-sulbactam (UNASYN) 1.5 g in sodium chloride 0.9 % 50 mL IVPB  Status:  Discontinued     1.5 g 100 mL/hr over 30 Minutes Intravenous Every 12 hours 05/01/15 1442 05/07/15 1419   05/01/15 1100  azithromycin (ZITHROMAX) 500 mg in dextrose 5 % 250 mL IVPB  Status:  Discontinued     500 mg 250 mL/hr over 60 Minutes Intravenous Every 24 hours 05/01/15 0920 05/05/15 0924   05/01/15 1000  cefTRIAXone (ROCEPHIN) 1 g in dextrose 5 % 50 mL IVPB - Premix  Status:  Discontinued     1 g 100 mL/hr over 30 Minutes Intravenous Every 24 hours  05/01/15 0920 05/01/15 1442      Subjective:   Omni R Wirtz is largely unresponsive and somnolent.  Objective:    Filed Vitals:   05/10/15 0941 05/10/15 1439 05/10/15 2109 05/11/15 0610  BP:  124/58 130/44 113/37  Pulse:  128 110 107  Temp:  98.2 F (36.8 C) 99.8 F (37.7 C) 99.2 F (37.3 C)  TempSrc:  Oral Axillary Axillary  Resp:  20 18 18   Height:      Weight:    61.825 kg (136 lb 4.8 oz)  SpO2: 98% 100% 99% 98%    Intake/Output Summary (Last 24 hours) at 05/11/15 0826 Last data filed at 05/11/15 0300  Gross per 24 hour  Intake    220 ml  Output      6 ml  Net    214 ml    Exam: Gen:  Awake and alert Cardiovascular:  Tachy, II/VI SEM  Respiratory:  Lungs CTAB Gastrointestinal:  Abdomen soft, winces to palpation mid abdomen, + BS Extremities:  Trace edema Skin: Diffuse blotchy macular rash, resolving  Data Reviewed:    Labs: Basic Metabolic Panel:  Recent Labs Lab 05/07/15 0443 05/08/15 0510 05/09/15 0450 05/10/15 0340 05/11/15 0532  NA 143 144 142 143 145  K 3.2* 3.1* 3.2* 3.6 3.5  CL 110 110 111 113* 113*  CO2 27 27 27 26 26   GLUCOSE 91 94 109* 93 93  BUN 42* 42* 41* 39* 42*  CREATININE 2.01* 1.81* 1.66* 1.63* 1.66*  CALCIUM 11.5* 11.4* 11.4* 11.4* 11.0*   GFR Estimated Creatinine Clearance: 22 mL/min (by C-G formula based on Cr of 1.66). Liver Function Tests:  Recent Labs Lab 05/11/15 0532  AST 17  ALT 11*  ALKPHOS 51  BILITOT 0.5  PROT 6.4*  ALBUMIN 2.0*   Coagulation profile  Recent Labs Lab 05/05/15 1019  INR 1.17    CBC:  Recent Labs Lab 05/06/15 0356 05/07/15 0443 05/08/15 0510 05/09/15 0450 05/10/15 0340  WBC 4.2 3.8* 4.1 4.0 3.6*  NEUTROABS  --   --   --   --  1.8  HGB 10.6* 9.9* 10.4* 10.5* 11.0*  HCT 32.3* 31.5* 32.4* 32.1* 34.5*  MCV 76.7* 76.6* 77.3* 75.9* 77.5*  PLT 235 191 192 165 203    Anemia work up:  Recent Labs  05/08/15 1700  FERRITIN 207   Sepsis Labs:  Recent Labs Lab  05/07/15 0443 05/08/15 0510 05/09/15 0450 05/10/15 0340  WBC 3.8* 4.1 4.0 3.6*   Microbiology Recent Results (from the past 240 hour(s))  Culture, blood (routine x 2) Call MD if unable to obtain prior to antibiotics being given     Status: None   Collection Time: 05/01/15  9:50 AM  Result Value Ref Range Status   Specimen Description BLOOD RIGHT WRIST  Final   Special Requests BOTTLES DRAWN AEROBIC AND ANAEROBIC 5CC  Final   Culture   Final    NO GROWTH 5 DAYS Performed at [facility]    Report Status 05/06/2015 FINAL  Final  Culture, blood (routine x 2) Call MD if unable to obtain prior to antibiotics being given     Status: None   Collection Time: 05/01/15 10:00 AM  Result Value Ref Range Status   Specimen Description BLOOD RIGHT ARM  Final   Special Requests BOTTLES DRAWN AEROBIC ONLY [place]  Final   Culture   Final    NO GROWTH 5 DAYS Performed at [facility]    Report Status 05/06/2015 FINAL  Final  Surgical pcr screen     Status: None   Collection Time: 05/06/15  1:09 PM  Result Value Ref Range Status   MRSA, PCR NEGATIVE NEGATIVE Final   Staphylococcus aureus NEGATIVE NEGATIVE Final    Comment:        The Xpert SA Assay (FDA approved for NASAL specimens in patients over 47 years of age), is one component of a comprehensive surveillance program.  Test performance has been validated by [facility] for patients greater than or equal to 59 year old. It is not intended to diagnose infection nor to guide or monitor treatment.      Medications:   . budesonide (PULMICORT) nebulizer solution  0.25 mg Nebulization BID  . calcitonin (salmon)  1 spray Alternating Nares Daily  . enoxaparin (LOVENOX) injection  30 mg Subcutaneous Q24H  . famotidine (PEPCID) IV  20 mg Intravenous Daily  .  feeding supplement (ENSURE ENLIVE)  237 mL Oral BID BM  . ferrous sulfate  325 mg Oral BID WC  . fluticasone  2 spray Each Nare Daily  . furosemide  10 mg Intravenous  Daily  . loratadine  10 mg Oral Daily  . megestrol  800 mg Oral Daily   Continuous Infusions:   Time spent: 25 minutes.    LOS: 10 days   [place] [facility] Pager [phone]. If unable to reach me by pager, please call my cell phone at [phone].  *Please refer to [email or web address], password [email or web address] to get updated schedule on who will round on this patient, as hospitalists switch teams weekly. If 7PM-7AM, please contact night-coverage at [email or web address], password [email or web address] for any overnight needs.  05/11/2015, 8:26 AM

## 2015-05-12 LAB — BASIC METABOLIC PANEL
ANION GAP: 5 (ref 5–15)
BUN: 42 mg/dL — ABNORMAL HIGH (ref 6–20)
CO2: 27 mmol/L (ref 22–32)
CREATININE: 1.62 mg/dL — AB (ref 0.44–1.00)
Calcium: 11.3 mg/dL — ABNORMAL HIGH (ref 8.9–10.3)
Chloride: 113 mmol/L — ABNORMAL HIGH (ref 101–111)
GFR calc Af Amer: 33 mL/min — ABNORMAL LOW (ref >60)
GFR, EST NON AFRICAN AMERICAN: 29 mL/min — AB (ref >60)
Glucose, Bld: 101 mg/dL — ABNORMAL HIGH (ref 65–99)
Potassium: 3.5 mmol/L (ref 3.5–5.1)
SODIUM: 145 mmol/L (ref 135–145)

## 2015-05-12 MED ORDER — ENSURE ENLIVE PO LIQD: 237.0000 mL | Freq: Four times a day (QID) | ORAL | Status: DC

## 2015-05-12 MED ORDER — HEPARIN SOD (PORK) LOCK FLUSH 100 UNIT/ML IV SOLN
250.0000 [IU] | INTRAVENOUS | Status: AC | PRN
  Administered 2015-05-12: 250 [IU]

## 2015-05-12 MED ORDER — DIPHENHYDRAMINE HCL 50 MG/ML IJ SOLN
12.5000 mg | Freq: Four times a day (QID) | INTRAMUSCULAR | Status: DC | PRN
  Administered 2015-05-12 (×3): 12.5 mg via INTRAVENOUS
  Filled 2015-05-12 (×3): qty 1

## 2015-05-12 MED ORDER — MEGESTROL ACETATE 400 MG/10ML PO SUSP
800.0000 mg | Freq: Every day | ORAL | Status: DC
Start: 2015-05-12 — End: 2015-05-17

## 2015-05-12 NOTE — Discharge Instructions (Signed)
Hypercalcemia Hypercalcemia means the calcium in your blood is too high. Calcium in our blood is important for the control of many things, such as:  Blood clotting.  Conducting of nerve impulses.  Muscle contraction.  Maintaining teeth and bone health.  Other body functions. In the bloodstream, calcium maintains a constant balance with another mineral, phosphate. Calcium is absorbed into the body through the small intestine. This is helped by vitamin D. Calcium levels are maintained mostly by vitamin D and a hormone (parathyroid hormone). But the kidneys also help. Hypercalcemia can happen when the concentration of calcium is too high for the kidneys to maintain balance. The body maintains a balance between the calcium we eat and the calcium already in our body. If calcium intake is increased or we cannot use calcium properly, there may be problems. Some common sources of calcium are:   Dairy products.  Nuts.  Eggs.  Whole grains.  Legumes.  Green leafy vegetables. CAUSES There are many causes of this condition, but some common ones are:  Hyperparathyroidism. This is an overactivity of the parathyroid gland.  Cancers of the breast, kidney, lung, head, and neck are common causes of calcium increases.  Medications that cause you to urinate more often (diuretics), nausea, vomiting, and diarrhea also increase the calcium in the blood.  Overuse of calcium-containing antacids. SYMPTOMS  Many patients with mild hypercalcemia have no symptoms. For those with symptoms, common problems include:  Loss of appetite.  Constipation.  Increased thirst.  Heart rhythm changes.  Abnormal thinking.  Nausea.  Abdominal pain.  Kidney stones.  Mood swings.  Coma and death when severe.  Vomiting.  Increased urination.  High blood pressure.  Confusion. DIAGNOSIS   Your caregiver will do a medical history and perform a physical exam on you.  Calcium and parathyroid hormone  (PTH) may be measured with a blood test. TREATMENT   The treatment depends on the calcium level and what is causing the higher level. Hypercalcemia can be life threatening. Fast lowering of the calcium level may be necessary.  With normal kidney function, fluids can be given by vein to clear the excess calcium. Hemodialysis works well to reduce dangerous calcium levels if there is poor kidney function. This is a procedure in which a machine is used to filter out unwanted substances. The blood is then returned to the body.  Drugs, such as diuretics, can be given after adequate fluid intake is established. These medications help the kidneys get rid of extra calcium. Drugs that lessen (inhibit) bone loss are helpful in gaining long-term control. Phosphate pills help lower high calcium levels caused by a low supply of phosphate. Anti-inflammatory agents such as steroids are helpful with some cancers and toxic levels of vitamin D.  Treatment of the underlying cause of the hypercalcemia will also correct the imbalance. Hyperparathyroidism is usually treated by surgical removal of one or more of the parathyroid glands and any tissue, other than the glands themselves, that is producing too much hormone.  The hypercalcemia caused by cancer is difficult to treat without controlling the cancer. Symptoms can be improved with fluids and drug therapy as outlined above. PROGNOSIS   Surgery to remove the parathyroid glands is usually successful. This also depends on the amount of damage to the kidneys and whether or not it can be treated.  Mild hypercalcemia can be controlled with good fluid intake and the use of effective medications.  Hypercalcemia often develops as a late complication of cancer. The expected outlook   is poor without effective anticancer therapy. PREVENTION   If you are at risk for developing hypercalcemia, be familiar with early symptoms. Report these to your caregiver.  Good fluid intake  (up to four quarts of liquid a day if possible) is helpful.  Try to control nausea and vomiting, and treat fevers to avoid dehydration.  Lowering the amount of calcium in your diet is not necessary. High blood calcium reduces absorption of calcium in the intestine.  Stay as active as possible. SEEK IMMEDIATE MEDICAL CARE IF:   You develop chest pain, sweating, or shortness of breath.  You get confused, feel faint or pass out.  You develop severe nausea and vomiting. MAKE SURE YOU:   Understand these instructions.  Will watch your condition.  Will get help right away if you are not doing well or get worse. Document Released: 12/26/2004 Document Revised: 02/26/2014 Document Reviewed: 10/07/2010 ExitCare Patient Information 2015 [employer]. This information is not intended to replace advice given to you by your health care provider. Make sure you discuss any questions you have with your health care provider.  

## 2015-05-12 NOTE — Clinical Social Work Placement (Signed)
   CLINICAL SOCIAL WORK PLACEMENT  NOTE  Date:  05/12/2015  Patient Details  Name: Connie Mccann MRN: [id number] Date of Birth: 25-Apr-1931  Clinical Social Work is seeking post-discharge placement for this patient at the [place] level of care (*CSW will initial, date and re-position this form in  chart as items are completed):  Yes   Patient/family provided with [place] Work Department's list of facilities offering this level of care within the geographic area requested by the patient (or if unable, by the patient's family).  Yes   Patient/family informed of their freedom to choose among providers that offer the needed level of care, that participate in Medicare, Medicaid or managed care program needed by the patient, have an available bed and are willing to accept the patient.  Yes   Patient/family informed of [place]'s ownership interest in [facility] and [facility], as well as of the fact that they are under no obligation to receive care at these facilities.  PASRR submitted to EDS on 05/02/15     PASRR number received on 05/02/15     Existing PASRR number confirmed on       FL2 transmitted to all facilities in geographic area requested by pt/family on 05/02/15     FL2 transmitted to all facilities within larger geographic area on       Patient informed that his/her managed care company has contracts with or will negotiate with certain facilities, including the following:            Patient/family informed of bed offers received.  Patient chooses bed at    [facility]   Physician recommends and patient chooses bed at      Patient to be transferred to  [facility] on  .May 12, 2015  Patient to be transferred to facility by   ambulance    Patient family notified on   May 12, 2015 of transfer.  Name of family member notified:    Jerry/spouse and hattie daughter     PHYSICIAN       Additional Comment:     _______________________________________________ Carlean Jews, LCSW 05/12/2015, 3:07 PM

## 2015-05-12 NOTE — Discharge Summary (Addendum)
Physician Discharge Summary  Connie Mccann:[id number] DOB: June 03, 1931 DOA: 05/01/2015  PCP: Iran Planas, PA-C  Admit date: 05/01/2015 Discharge date: 05/12/2015   Recommendations for Outpatient Follow-Up:   1. Note: Patient received Pamidronate 05/03/15, due to repeat dose 06/03/15 if calcium remains elevated. 2. Recommend ongoing Speech Therapy. 3. The patient will need to F/U with Dr. Alen Blew post discharge for treatment recommendations.   Discharge Diagnosis:   Principal Problem:    Lymphoma Active Problems:    Enlarged lymph nodes    Community acquired pneumonia    Hypercalcemia    Malnutrition of moderate degree    Lymphadenopathy    Liver lesion    Lethargy    Rash and nonspecific skin eruption    Hypokalemia    Acute encephalopathy   Discharge disposition:  SNF  Discharge Condition: Improved.  Diet recommendation: Full liquids with Ensure QID.    History of Present Illness:   Connie Mccann is an 81 y.o. female with a PMH of grade 1 chronic diastolic CHF, dementia, newly diagnosed lymphoid disorder, suspicious for peripheral T-cell lymphoma who was admitted 05/01/15 with weakness/encephalopathy.  Hospital Course by Problem:   Generalized weakness secondary to failure to thrive in the setting of newly diagnosed lymphoma - Determined to be secondary to progressive failure to thrive and deconditioning in the setting of what appears to be progressive lymphoma, subsequent malignancy induced hypercalcemia, also lobar PNA, imposed on known dementia.  - S/P course of ABX Unasyn and Zithromax for 7 days (see below). - Encouraged oral intake as patient able to tolerate, started Megace to stimulate appetite. - For SNF. More awake and alert.  Rash  - Questionable adverse drug reaction. Improved with Pepcid and Claritin.  Non-pruritic at present.  Community acquired pneumonia, unknown pathogen, retrocardiac area - Sputum analysis, culture, urine Legionella  and strep pneumo, negative. - S/P Zithromax and Unasyn, completed a seven-day course on 05/07/2015. - IS while awake, pulmonary toiletry, ambulation as tolerated.   High risk aspiration - The patient has been refusing to swallow tablets at times, pocketing food and pills.  - Continue aspiration precautions. ST asked to re-evaluate to determine if diet can be advanced.  Severe hypercalcemia on admission > 15, malignancy induced  - Intact PTH 9 (low) and Ca >14 (high). - CT chest consistent with lymphoma, it is now apparent that hypercalcemia is malignancy related. - Status post one dose of Pamidronate 05/03/15 and although calcium has trended down, has not normalized.  - Can consider repeating pamidronate one month after first dose. - Continue Lasix.   Pulmonary vascular congestion secondary to acute on chronic diastolic CHF, grade I  - Exacerbated by IVF that pt has received for treatment of hypercalcemia as well as underlying moderate hypoalbuminemia.  - 2 D ECHO was done 09/2014 and was notable for normal and preserved EF with grade I diastolic CHF. - IV fluids discontinued, continue to monitor I/O and daily weights. Currently appears to be compensated.  Bilateral upper extremity edema - No evidence of DVT based on Doppler studies.  Lymphoma with Extensive lymphadenopathy  - Noted on CT abd and pelvis in August 2015, also noted new 7 mm left upper lobe pulmonary nodule. - CT chest done 7/8, results below, d/w family concern for lymphoma. - S/P right axillary LN biopsy (done 7/11), awaiting final path report, but initial report concerning for lymphoma. - Per Dr. 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 - Follow-up flow cytometry and  iimmunostaining to confirm.  New 7 mm left upper lobe pulmonary nodule - Noted on recent CT chest. - Not clear if related to possible lymphoma or underlying bronchogenic  carcinoma.  6 cm posterior right hepatic lobe lesion  - Noted on PET scan 05/2014 and with evidence of metabolic activity suspicious for neoplastic process.  - Recommended MRI abd has not been done as pt was unable to complete exam, unable to hold breath long enough.  Acute metabolic encephalopathy - Likely secondary to CAP, hypercalcemia, acute on chronic renal failure and progressive failure to thrive. - Improved over past 24 hours.  Acute on chronic renal failure, stage II - Based on GFR patient meets criteria for stage II chronic kidney failure. - Baseline creatinine in the past 6 months has been 0.9-1.3. - Higher on this admission ~2 and likely prerenal in etiology from poor oral intake and dehydration, hypercalcemia.  - Continue holding losartan. - Status post renal US with no hydro and suggestive of chronic renal disease. - Cr stable at 1.6.   Anemia of chronic disease, microcytic, chronic process from lymphadenopathy, ? Lymphoma  - Recent anemia panel notable for iron level 46 (normal), TIBC 152 (low) c/w anemia of chronic disease. Ferritin 207. - No signs of active bleeding. Findings consistent with anemia of chronic disease.  Moderate malnutrition - In the setting of ongoing illness outlined above. - Nutritionist consulted, recommendations appreciated.   Hypokalemia - Secondary to Lasix. - Continue to supplement.  Acute leukopenia - Resolved.  Acute functional quadriplegia - OOB to chair at least per shift.  Medical Consultants:    Oncology  General Surgery   Discharge Exam:   Filed Vitals:   05/12/15 0523  BP: 147/44  Pulse: 103  Temp: 98.2 F (36.8 C)  Resp: 18   Filed Vitals:   05/11/15 1326 05/11/15 2008 05/12/15 0523 05/12/15 0916  BP: 115/71 146/44 147/44   Pulse: 106 105 103   Temp: 97.4 F (36.3 C) 98.7 F (37.1 C) 98.2 F (36.8 C)   TempSrc: Oral Oral Oral   Resp: 20 20 18    Height:      Weight:   62 kg (136 lb 11 oz)   SpO2:  100% 98% 100% 97%    Gen:  NAD, sitting up in chair slightly sleepy Cardiovascular:  RRR, II/VI SEM Respiratory: Lungs CTAB Gastrointestinal: Abdomen soft, NT/ND with normal active bowel sounds. Extremities: No C/E/C   The results of significant diagnostics from this hospitalization (including imaging, microbiology, ancillary and laboratory) are listed below for reference.     Procedures and Diagnostic Studies:   Dg Chest 2 View  05/01/2015   CLINICAL DATA:  Fatigue.  Loss of appetite.  EXAM: CHEST  2 VIEW  COMPARISON:  10/20/2014  FINDINGS: There is a retrocardiac opacity which was not seen previously. The opacity obscures the left diaphragm. There is likely a small associated pleural effusion. Associated lucency is likely residual aerated lung.  Borderline cardiomegaly.  Negative aortic and hilar contours.  IMPRESSION: Retrocardiac atelectasis or pneumonia with probable small effusion. Followup PA and lateral chest X-ray is recommended in 3-4 weeks following trial of antibiotic therapy to ensure resolution.   Electronically Signed   By: Monte Fantasia M.D.   On: 05/01/2015 07:00   Ct Chest Wo Contrast  05/03/2015   CLINICAL DATA:  Shortness of breath. Pleural effusion. Asthma. Lethargy.  EXAM: CT CHEST WITHOUT CONTRAST  TECHNIQUE: Multidetector CT imaging of the chest was performed following the standard protocol  without IV contrast.  COMPARISON:  CT chest 10/20/2014  FINDINGS: The heart is enlarged. Coronary artery calcifications are present. Prevascular lymph nodes are slightly more prominent than on the prior study. There is is increase in size of bilateral axillary lymph nodes. The largest right-sided node measures 15 x 24 x 30 mm. The largest left-sided node is 16 x 24 mm. A right supraclavicular node is increased in size from 14 day 18 mm in short axis.  Moderate bilateral pleural effusions are present. Bilateral hilar adenopathy is evident. The  There is atelectasis associated with the  effusions bilaterally. A 7 mm left upper lobe pulmonary nodule is new. No other focal nodule is present.  The bone windows are unremarkable. There is moderate edema within the subcutaneous soft tissues of the chest.  Limited imaging of the upper abdomen demonstrates a focal lesion in the posterior right liver most compatible with a hemangioma.  IMPRESSION: 1. Interval progression of bilateral axillary adenopathy concerning for recurrent lymphoma. 2. New bilateral pleural effusions and diffuse subcutaneous edema suggesting congestive heart failure and anasarca. 3. New 7 mm left upper lobe pulmonary nodule. If the patient is at high risk for bronchogenic carcinoma, follow-up chest CT at 3-67months is recommended. If the patient is at low risk for bronchogenic carcinoma, follow-up chest CT at 6-12 months is recommended. This recommendation follows the consensus statement: Guidelines for Management of Small Pulmonary Nodules Detected on CT Scans: A Statement from the [place] as published in Radiology 2005; 237:395-400. 4. Stable cardiomegaly and coronary artery disease.   Electronically Signed   By: San Morelle M.D.   On: 05/03/2015 17:21   US Renal  05/02/2015   CLINICAL DATA:  Acute onset of renal failure.  Initial encounter.  EXAM: RENAL / URINARY TRACT ULTRASOUND COMPLETE  COMPARISON:  CT of the abdomen and pelvis performed 10/20/2014  FINDINGS: Right Kidney:  Length: 8.9 cm. Mildly increased parenchymal echogenicity noted. No mass or hydronephrosis visualized.  Left Kidney:  Length: 9.9 cm. Mildly increased parenchymal echogenicity noted. No hydronephrosis visualized. A 1.6 cm cyst is noted at the interpole region of the left kidney.  Bladder:  Not characterized on this study.  IMPRESSION: 1. No evidence of hydronephrosis. 2. Mildly increased renal parenchymal echogenicity raises concern for medical renal disease. 3. Small left renal cyst noted.   Electronically Signed   By: Garald Balding M.D.    On: 05/02/2015 21:30     Labs:   Basic Metabolic Panel:  Recent Labs Lab 05/08/15 0510 05/09/15 0450 05/10/15 0340 05/11/15 0532 05/12/15 0600  NA 144 142 143 145 145  K 3.1* 3.2* 3.6 3.5 3.5  CL 110 111 113* 113* 113*  CO2 27 27 26 26 27   GLUCOSE 94 109* 93 93 101*  BUN 42* 41* 39* 42* 42*  CREATININE 1.81* 1.66* 1.63* 1.66* 1.62*  CALCIUM 11.4* 11.4* 11.4* 11.0* 11.3*   GFR Estimated Creatinine Clearance: 22.5 mL/min (by C-G formula based on Cr of 1.62). Liver Function Tests:  Recent Labs Lab 05/11/15 0532  AST 17  ALT 11*  ALKPHOS 51  BILITOT 0.5  PROT 6.4*  ALBUMIN 2.0*   Coagulation profile  Recent Labs Lab 05/05/15 1019  INR 1.17    CBC:  Recent Labs Lab 05/06/15 0356 05/07/15 0443 05/08/15 0510 05/09/15 0450 05/10/15 0340  WBC 4.2 3.8* 4.1 4.0 3.6*  NEUTROABS  --   --   --   --  1.8  HGB 10.6* 9.9* 10.4* 10.5* 11.0*  HCT 32.3* 31.5* 32.4* 32.1* 34.5*  MCV 76.7* 76.6* 77.3* 75.9* 77.5*  PLT 235 191 192 165 203   Microbiology Recent Results (from the past 240 hour(s))  Surgical pcr screen     Status: None   Collection Time: 05/06/15  1:09 PM  Result Value Ref Range Status   MRSA, PCR NEGATIVE NEGATIVE Final   Staphylococcus aureus NEGATIVE NEGATIVE Final    Comment:        The Xpert SA Assay (FDA approved for NASAL specimens in patients over 80 years of age), is one component of a comprehensive surveillance program.  Test performance has been validated by [facility] for patients greater than or equal to 32 year old. It is not intended to diagnose infection nor to guide or monitor treatment.      Discharge Instructions:       Discharge Instructions    Call MD for:  extreme fatigue    Complete by:  As directed      Call MD for:  severe uncontrolled pain    Complete by:  As directed      Call MD for:  temperature >100.4    Complete by:  As directed      Diet - low sodium heart healthy    Complete by:  As directed       Increase activity slowly    Complete by:  As directed      Walk with assistance    Complete by:  As directed      Walker     Complete by:  As directed             Medication List    STOP taking these medications        losartan 50 MG tablet  Commonly known as:  COZAAR      TAKE these medications        albuterol 108 (90 BASE) MCG/ACT inhaler  Commonly known as:  PROAIR HFA  Inhale 2 puffs into the lungs every 4 (four) hours as needed for wheezing or shortness of breath.     AMBULATORY NON FORMULARY MEDICATION  Compression stocking for left leg to knee. (would prefer kind that zip on the medial leg) 30-66mmHG     beclomethasone 80 MCG/ACT inhaler  Commonly known as:  QVAR  Inhale 2 puffs into the lungs 2 (two) times daily.     cetirizine 10 MG tablet  Commonly known as:  ZYRTEC  Take 10 mg by mouth at bedtime.     feeding supplement (ENSURE ENLIVE) Liqd  Take 237 mLs by mouth 4 (four) times daily.     ferrous sulfate 325 (65 FE) MG EC tablet  Take 1 tablet (325 mg total) by mouth 2 (two) times daily with a meal.     fluticasone 50 MCG/ACT nasal spray  Commonly known as:  FLONASE  Place 2 sprays into both nostrils daily.     furosemide 20 MG tablet  Commonly known as:  LASIX  Take 2 tablets (40 mg total) by mouth daily.     megestrol 400 MG/10ML suspension  Commonly known as:  MEGACE  Take 20 mLs (800 mg total) by mouth daily.     multivitamin tablet  Take 1 tablet by mouth daily.     OCUVITE EYE + MULTI Tabs  Take 1 tablet by mouth daily.     Potassium Chloride ER 20 MEQ Tbcr  Take 20 mEq by mouth daily.  Time coordinating discharge: 35 minutes.  Signed:  Arturo Freundlich  Pager [phone] [facility] 05/12/2015, 9:56 AM

## 2015-05-13 ENCOUNTER — Encounter: Payer: Self-pay | Admitting: Internal Medicine

## 2015-05-13 ENCOUNTER — Non-Acute Institutional Stay (SKILLED_NURSING_FACILITY): Payer: Medicare Other | Admitting: Internal Medicine

## 2015-05-13 DIAGNOSIS — R600 Localized edema: Secondary | ICD-10-CM

## 2015-05-13 DIAGNOSIS — C8590 Non-Hodgkin lymphoma, unspecified, unspecified site: Secondary | ICD-10-CM

## 2015-05-13 DIAGNOSIS — R609 Edema, unspecified: Secondary | ICD-10-CM

## 2015-05-13 DIAGNOSIS — L270 Generalized skin eruption due to drugs and medicaments taken internally: Secondary | ICD-10-CM | POA: Diagnosis not present

## 2015-05-13 DIAGNOSIS — D509 Iron deficiency anemia, unspecified: Secondary | ICD-10-CM | POA: Diagnosis not present

## 2015-05-13 DIAGNOSIS — R5381 Other malaise: Secondary | ICD-10-CM

## 2015-05-13 DIAGNOSIS — R1310 Dysphagia, unspecified: Secondary | ICD-10-CM

## 2015-05-13 DIAGNOSIS — E46 Unspecified protein-calorie malnutrition: Secondary | ICD-10-CM

## 2015-05-13 DIAGNOSIS — G9340 Encephalopathy, unspecified: Secondary | ICD-10-CM

## 2015-05-13 DIAGNOSIS — R627 Adult failure to thrive: Secondary | ICD-10-CM

## 2015-05-13 NOTE — Progress Notes (Signed)
Patient ID: Connie Mccann, female   DOB: 08-13-1931, 81 y.o.   MRN: [id number]     [place]  PCP: Iran Planas, PA-C  Code Status: DNR  No Known Allergies  Chief Complaint  Patient presents with  . New Admit To SNF    New Admission      HPI:  81 y.o. patient is here for short term rehabilitation post hospital admission from 05/01/15-05/12/15 with weakness and encephalopathy.this was thought to be from failure to thrive in setting of newly diagnosed lymphoma. She had hyplercalcemia and lobar pneumonia. She was given antibiotics, iv fluids and pamidronate. She then had pulmonary vascular congestion and received diuresis. She was started on megace to stimulate appetite. She had a drug reaction with rash on her trunk and chest area. She also had upper extremity edema and doppler was done to rule out DVT. New LUL pulmonary nodule and right hepatic lobe lesion were noted this admission on imaging study. She has PMH of grade 1 chronic diastolic CHF, dementia, newly diagnosed lymphoid disorder, suspicious for peripheral T-cell lymphoma. She is seen in her room today. She is lethargic and weak. She participates minimally in conversation. Currently on full liquid diet. Unable to obtain HPI or ROS. As per staff, poor po intake. No fever or chills reported.    Review of Systems:  Unable to obtain  Past Medical History  Diagnosis Date  . Hypertension   . Asthma   . Lymphoma 05/10/2015   Past Surgical History  Procedure Laterality Date  . Inguinal lymph node biopsy    . Lymph node biopsy Right 05/06/2015    Procedure: RIGHT AXILLARY LYMPH NODE BIOPsy;  Surgeon: Johnathan Hausen, MD;  Location: [facility];  Service: General;  Laterality: Right;   Social History:   reports that she has never smoked. She does not have any smokeless tobacco history on file. She reports that she does not drink alcohol or use illicit drugs.  Family History  Problem Relation Age of Onset  . Alzheimer's  disease Sister   . Stroke Maternal Aunt     Medications:   Medication List       This list is accurate as of: 05/13/15  9:55 AM.  Always use your most recent med list.               albuterol 108 (90 BASE) MCG/ACT inhaler  Commonly known as:  PROAIR HFA  Inhale 2 puffs into the lungs every 4 (four) hours as needed for wheezing or shortness of breath.     AMBULATORY NON FORMULARY MEDICATION  Compression stocking for left leg to knee. (would prefer kind that zip on the medial leg) 30-28mmHG     beclomethasone 80 MCG/ACT inhaler  Commonly known as:  QVAR  Inhale 2 puffs into the lungs 2 (two) times daily.     cetirizine 10 MG tablet  Commonly known as:  ZYRTEC  Take 10 mg by mouth at bedtime.     ferrous sulfate 325 (65 FE) MG EC tablet  Take 1 tablet (325 mg total) by mouth 2 (two) times daily with a meal.     fluticasone 50 MCG/ACT nasal spray  Commonly known as:  FLONASE  Place 2 sprays into both nostrils daily.     furosemide 20 MG tablet  Commonly known as:  LASIX  Take 2 tablets (40 mg total) by mouth daily.     megestrol 400 MG/10ML suspension  Commonly known as:  MEGACE  Take 20 mLs (800 mg total) by mouth daily.     multivitamin tablet  Take 1 tablet by mouth daily.     OCUVITE EYE + MULTI Tabs  Take 1 tablet by mouth daily.     Potassium Chloride ER 20 MEQ Tbcr  Take 20 mEq by mouth daily.     UNABLE TO FIND  Med Name: Med pass 220 mL by mouth four times daily         Physical Exam: Filed Vitals:   05/13/15 0941  BP: 141/74  Pulse: 110  Temp: 98.8 F (37.1 C)  TempSrc: Oral  Resp: 18  SpO2: 97%    General- elderly female, frail, in no acute distress Head- normocephalic, atraumatic Throat- moist mucus membrane Eyes- PERRLA, EOMI, no pallor, no icterus, no discharge, normal conjunctiva, normal sclera Neck- no cervical lymphadenopathy Cardiovascular- normal s1,s2, no murmurs, palpable dorsalis pedis and radial pulses, trace leg  edema Respiratory- bilateral poor inspiratory effort, no wheeze, no rhonchi, no crackles, no use of accessory muscles Abdomen- bowel sounds present, soft, non tender Musculoskeletal- able to move all 4 extremities, generalized weakness  Neurological- unable to assess Skin- warm and dry, erythematous rash on chest area, right arm PICC line, both arm has edema right > left Psychiatry- unable to assess   Labs reviewed: Basic Metabolic Panel:  Recent Labs  10/20/14 1204  05/01/15 0950  05/10/15 0340 05/11/15 0532 05/12/15 0600  NA 135  < > 136  < > 143 145 145  K 4.1  < > 4.1  < > 3.6 3.5 3.5  CL 106  < > 105  < > 113* 113* 113*  CO2 24  < > 28  < > 26 26 27   GLUCOSE 101*  < > 125*  < > 93 93 101*  BUN 11  < > 39*  < > 39* 42* 42*  CREATININE 0.77  < > 2.10*  < > 1.63* 1.66* 1.62*  CALCIUM 9.3  < > 14.4*  < > 11.4* 11.0* 11.3*  MG 1.6  --  1.8  --   --   --   --   PHOS  --   --  4.0  --   --   --   --   < > = values in this interval not displayed. Liver Function Tests:  Recent Labs  05/01/15 0606 05/01/15 0950 05/11/15 0532  AST 21 16 17   ALT 13* 11* 11*  ALKPHOS 51 48 51  BILITOT 0.5 0.6 0.5  PROT 7.4 7.0 6.4*  ALBUMIN 2.6* 2.3* 2.0*   No results for input(s): LIPASE, AMYLASE in the last 8760 hours. No results for input(s): AMMONIA in the last 8760 hours. CBC:  Recent Labs  05/01/15 0606  05/04/15 0530  05/08/15 0510 05/09/15 0450 05/10/15 0340  WBC 4.9  < > 4.2  < > 4.1 4.0 3.6*  NEUTROABS 2.6  --  2.2  --   --   --  1.8  HGB 11.1*  < > 10.5*  < > 10.4* 10.5* 11.0*  HCT 33.7*  < > 32.4*  < > 32.4* 32.1* 34.5*  MCV 76.4*  < > 76.2*  < > 77.3* 75.9* 77.5*  PLT 250  < > 235  < > 192 165 203  < > = values in this interval not displayed. Cardiac Enzymes:  Recent Labs  10/20/14 0953 10/20/14 1204 10/20/14 1947 10/21/14 0137  CKTOTAL 74  --   --   --  TROPONINI  --  <0.03 <0.03 <0.03   BNP: Invalid input(s): POCBNP CBG:  Recent Labs   06/14/14 0951 10/20/14 1147 10/21/14 0823  GLUCAP 97 89 84    Radiological Exams: Dg Chest 2 View  05/01/2015   CLINICAL DATA:  Fatigue.  Loss of appetite.  EXAM: CHEST  2 VIEW  COMPARISON:  10/20/2014  FINDINGS: There is a retrocardiac opacity which was not seen previously. The opacity obscures the left diaphragm. There is likely a small associated pleural effusion. Associated lucency is likely residual aerated lung.  Borderline cardiomegaly.  Negative aortic and hilar contours.  IMPRESSION: Retrocardiac atelectasis or pneumonia with probable small effusion. Followup PA and lateral chest X-ray is recommended in 3-4 weeks following trial of antibiotic therapy to ensure resolution.   Electronically Signed   By: Monte Fantasia M.D.   On: 05/01/2015 07:00   US Renal  05/02/2015   CLINICAL DATA:  Acute onset of renal failure.  Initial encounter.  EXAM: RENAL / URINARY TRACT ULTRASOUND COMPLETE  COMPARISON:  CT of the abdomen and pelvis performed 10/20/2014  FINDINGS: Right Kidney:  Length: 8.9 cm. Mildly increased parenchymal echogenicity noted. No mass or hydronephrosis visualized.  Left Kidney:  Length: 9.9 cm. Mildly increased parenchymal echogenicity noted. No hydronephrosis visualized. A 1.6 cm cyst is noted at the interpole region of the left kidney.  Bladder:  Not characterized on this study.  IMPRESSION: 1. No evidence of hydronephrosis. 2. Mildly increased renal parenchymal echogenicity raises concern for medical renal disease. 3. Small left renal cyst noted.   Electronically Signed   By: Garald Balding M.D.   On: 05/02/2015 21:30    Assessment/Plan  Physical deconditioning Will have patient work with PT/OT as tolerated to regain strength and restore function.  Fall precautions are in place.  Failure to thrive With progression of this newly diagnosed lymphoma with possible metastases to liver and lung. Will have her work with therapy team as tolerated, encourage to be OOB daily. If no  improvement noted, to consider palliative care consult. Assistance with ADLs for now. Aspiration precautions  Dysphagia Continue full liquid diet for now with aspiration precautions  Protein calorie malnutrition Continue full liquid diet with procel and medpass. Monitor weight. Continue megace for appetite stimulation. Pressure ulcer prophylaxis  Lymphoma Most likely t cell lymphoma. To be followed with oncology. Here for STR. Poor overall prognosis.  Drug rash Appears to be non pruritic in nature. Start famotidine 20 mg daily with loratadine 10 mg daily to help prevent histamine release for now  Arm edema Likely has lymphedema with her lymphoma, keep arms elevated as tolerated, continue lasix and kcl supplement  Encephalopathy Multifactorial with dementia, hypercalcemia, lymphoma and deconditioning. Monitor clinically for now.   Iron def anemia Continue ferrous sulfate 325 mg bid for now   Goals of care: short term rehabilitation   Labs/tests ordered: cbc with diff, cmp, tsh, ammonia  Family/ staff Communication: reviewed care plan with patient and nursing supervisor    Blanchie Serve, MD  [facility], [place] [facility] [phone] (Monday-Friday 8 am - 5 pm) [phone] (afterhours)

## 2015-05-14 LAB — CBC AND DIFFERENTIAL
HCT: 34 % — AB (ref 36–46)
Hemoglobin: 10.9 g/dL — AB (ref 12.0–16.0)
PLATELETS: 148 10*3/uL — AB (ref 150–399)
WBC: 3.7 10^3/mL

## 2015-05-14 LAB — BASIC METABOLIC PANEL
BUN: 45 mg/dL — AB (ref 4–21)
Creatinine: 1.8 mg/dL — AB (ref 0.5–1.1)
GLUCOSE: 80 mg/dL
POTASSIUM: 4.5 mmol/L (ref 3.4–5.3)
Sodium: 143 mmol/L (ref 137–147)

## 2015-05-14 LAB — TSH: TSH: 6.78 u[IU]/mL — AB (ref 0.41–5.90)

## 2015-05-14 LAB — HEPATIC FUNCTION PANEL
ALT: 9 U/L (ref 7–35)
AST: 14 U/L (ref 13–35)
Alkaline Phosphatase: 67 U/L (ref 25–125)
Bilirubin, Total: 0.4 mg/dL

## 2015-05-16 ENCOUNTER — Encounter (HOSPITAL_COMMUNITY): Payer: Self-pay | Admitting: Surgery

## 2015-05-16 ENCOUNTER — Encounter (HOSPITAL_COMMUNITY): Payer: Self-pay

## 2015-05-17 ENCOUNTER — Non-Acute Institutional Stay (SKILLED_NURSING_FACILITY): Payer: Medicare Other | Admitting: Internal Medicine

## 2015-05-17 ENCOUNTER — Encounter: Payer: Self-pay | Admitting: Internal Medicine

## 2015-05-17 DIAGNOSIS — R638 Other symptoms and signs concerning food and fluid intake: Secondary | ICD-10-CM

## 2015-05-17 DIAGNOSIS — R609 Edema, unspecified: Secondary | ICD-10-CM | POA: Diagnosis not present

## 2015-05-17 DIAGNOSIS — R21 Rash and other nonspecific skin eruption: Secondary | ICD-10-CM

## 2015-05-17 DIAGNOSIS — R627 Adult failure to thrive: Secondary | ICD-10-CM

## 2015-05-17 NOTE — Progress Notes (Signed)
Patient ID: Connie Mccann, female   DOB: 08-27-1931, 81 y.o.   MRN: [id number]     [facility]  Code Status: DNR  No Known Allergies  Chief Complaint  Patient presents with  . Acute Visit    Decreased oral intake, right arm swelling, rash and decreased blood sugar    HPI 82 y/o female patient is seen today with acute concerns. She has had decreased po intake for few days. She is not participating with therapy. Staff have noticed her right arm swelling to have worsened. She has a picc line in that arm and has newly diagnosed lymphoma. She was recently in the hospital and doppler study was done to rule out DVT. The rash on her chest area persists and now has extended to her abdominal wall area. Her sugar reading has been low per staff. Patient seen in her room today, unable to participate in HPI or ROS.  ROS Unable to obtain  Past Medical History  Diagnosis Date  . Hypertension   . Asthma   . Lymphoma 05/10/2015      Medication List       This list is accurate as of: 05/17/15  3:47 PM.  Always use your most recent med list.               albuterol 108 (90 BASE) MCG/ACT inhaler  Commonly known as:  PROAIR HFA  Inhale 2 puffs into the lungs every 4 (four) hours as needed for wheezing or shortness of breath.     AMBULATORY NON FORMULARY MEDICATION  Compression stocking for left leg to knee. (would prefer kind that zip on the medial leg) 30-38mmHG     beclomethasone 80 MCG/ACT inhaler  Commonly known as:  QVAR  Inhale 2 puffs into the lungs 2 (two) times daily.     famotidine 20 MG tablet  Commonly known as:  PEPCID  Take 20 mg by mouth 2 (two) times daily.     ferrous sulfate 325 (65 FE) MG EC tablet  Take 1 tablet (325 mg total) by mouth 2 (two) times daily with a meal.     fluticasone 50 MCG/ACT nasal spray  Commonly known as:  FLONASE  Place 2 sprays into both nostrils daily.     furosemide 20 MG tablet  Commonly known as:  LASIX  Take 2  tablets (40 mg total) by mouth daily.     loratadine 10 MG tablet  Commonly known as:  CLARITIN  Take 10 mg by mouth daily. For rash     multivitamin tablet  Take 1 tablet by mouth daily.     OCUVITE EYE + MULTI Tabs  Take 1 tablet by mouth daily.     Potassium Chloride ER 20 MEQ Tbcr  Take 20 mEq by mouth daily.     PROCEL Pack  Take by mouth. 2 scoops twice daily for nutritional supplement and to increase protein     UNABLE TO FIND  Med Name: Med pass 158mL by mouth three times daily        Physical exam BP 136/81 mmHg  Pulse 73  Resp 18  SpO2 96%  General- elderly female ill appearing and appears uncomfortable Head- atraumatic, normocephalic Neck- no jugular vein distension Cardiovascular- normal s1,s2, no murmurs Respiratory- bilateral clear to auscultation, no wheeze, no rhonchi, no crackles Abdomen- bowel sounds present, soft, non tender Musculoskeletal-  Neurological- unable to assess, somnolent   Assessment/plan  Failure to thrive Patient has been  declining and has overall poor prognosis with this newly diagnosed lymphoma, her dementia. Will have her on iv fluids d5 half normal saline for now with her not taking po feed. Will get palliative care consult. medpass and MVI as tolerated for now. Will need comfort care  Right arm swelling Persists, slightly worsened from last visit but no redness or crepitus on exam. Will keep this line for iv fluids for now. Post palliative care consult, can d/c picc line if patient and family desires this. dvt was recently ruled out in hospital. Her lymphoma and lymphatic obstruction is likely causing this. Arm elevation when possible and skin care per facility protocol  Poor po intake Start iv fluid d51/2 norma saline at 75 cc/hr for now and encourage po intake as tolerated with aspiration precautions  Rash Not new, likely drug reaction to antibiotic in the hospital. Patient not scratching it. No signs of infection to it,  monitor clinically for now   [facility], MD  [place] (Monday-Friday 8 am - 5 pm) [phone] (afterhours)

## 2015-05-27 ENCOUNTER — Encounter: Payer: Self-pay | Admitting: Oncology

## 2015-05-27 NOTE — Progress Notes (Signed)
Events from the patient's last hospitalization was noted. Pathology from her biopsy was reviewed. She has documentation of T-cell lymphoma accompanied by overall poor performance status and advanced dementia.  Her prognosis is rather poor with very limited due to treatment options. I agree with palliative care only and hospice enrollment.  I am more than happy to evaluate the patient if needed to at the [facility] but I feel that it is very unlikely that my assessment will change. It would be reasonable to spare the patient the inconvenience of traveling at this time.

## 2015-05-27 DEATH — deceased

## 2016-08-04 IMAGING — US US RENAL
1 series · 14 of 25 positions shown · non-contrast
Comparison: CT of the abdomen and pelvis performed 10/20/2014

CLINICAL DATA: Acute onset of renal failure.  Initial encounter.

EXAM:
RENAL / URINARY TRACT ULTRASOUND COMPLETE

[Series 1: us renal · 0.20mm/px · 14 of 34 slices shown]
[im 1/34]
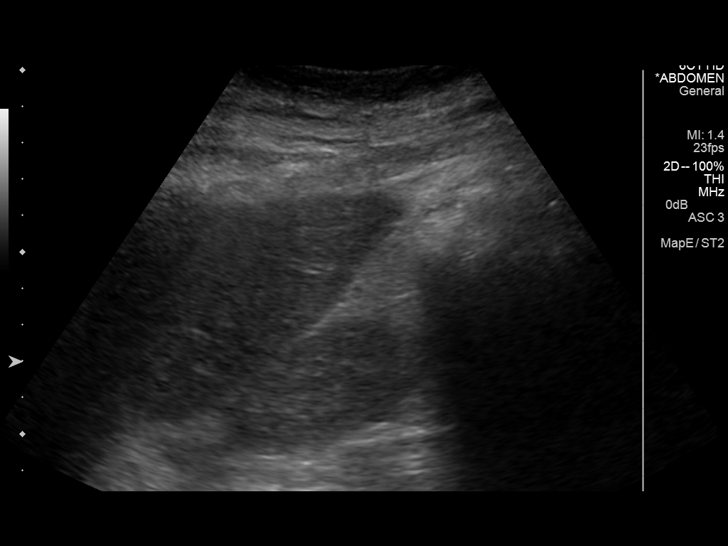
[im 3/34]
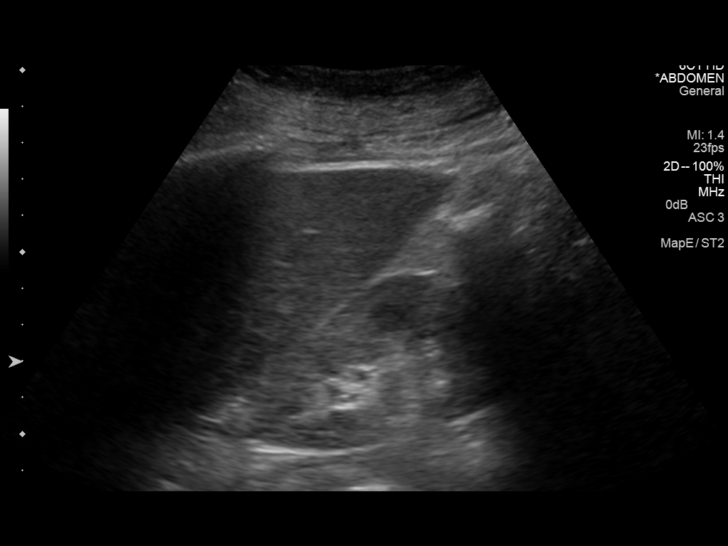
[im 6/34]
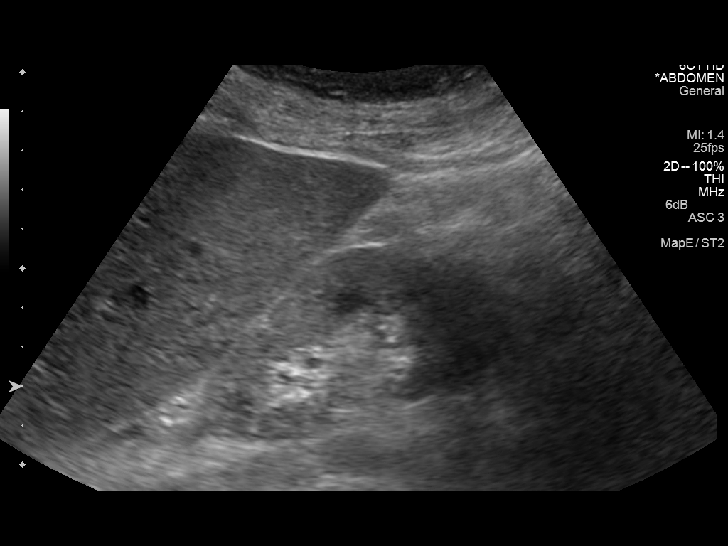
[im 9/34]
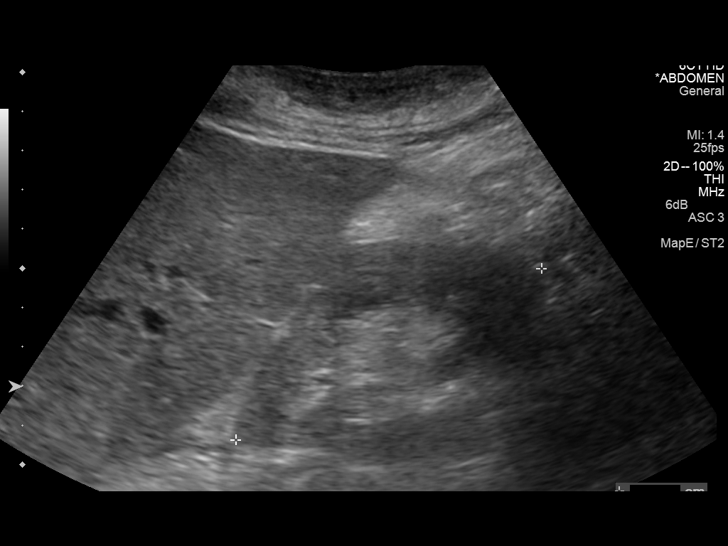
[im 12/34]
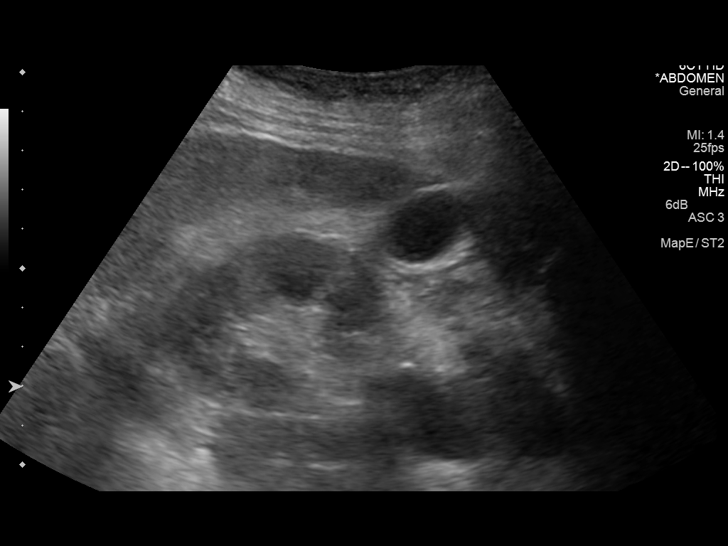
[im 13/34]
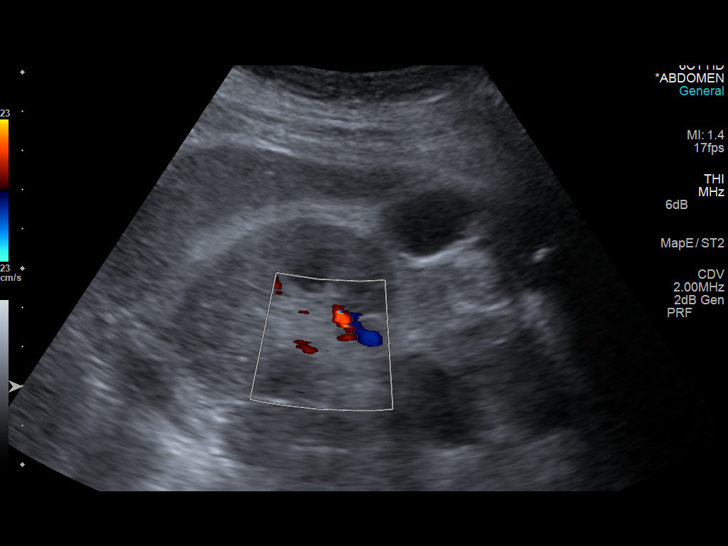
[im 16/34]
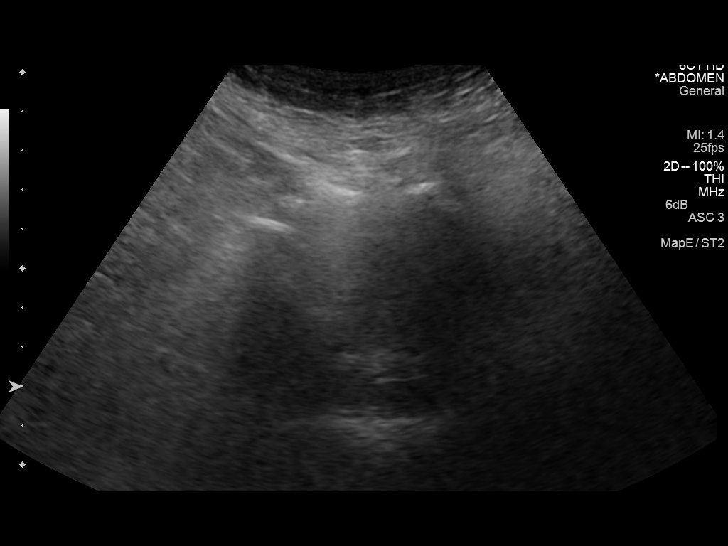
[im 18/34]
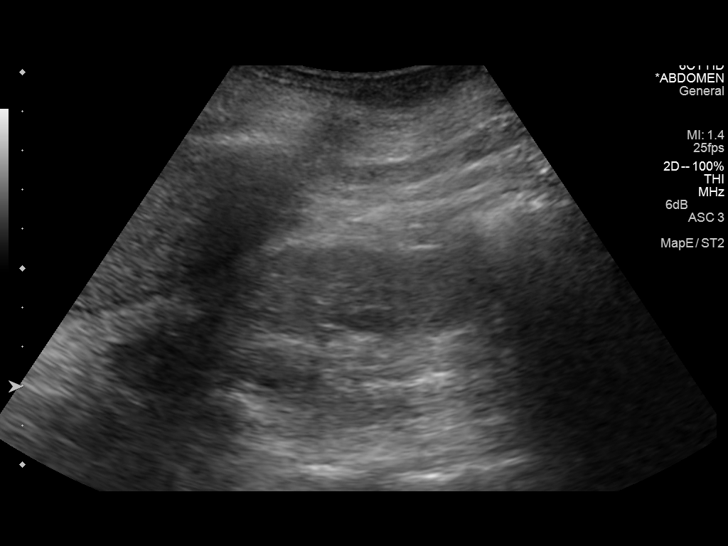
[im 21/34]
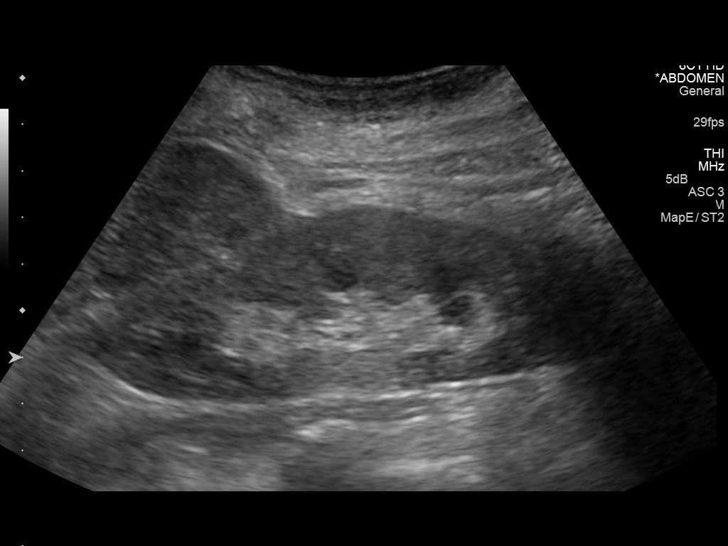
[im 23/34]
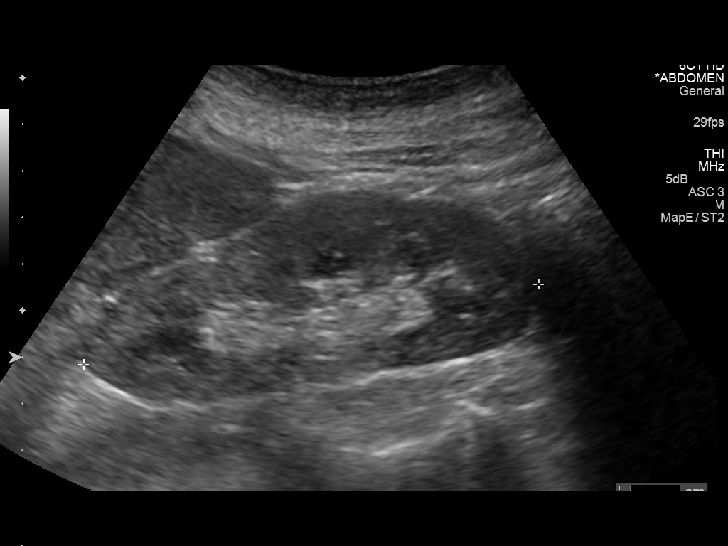
[im 25/34]
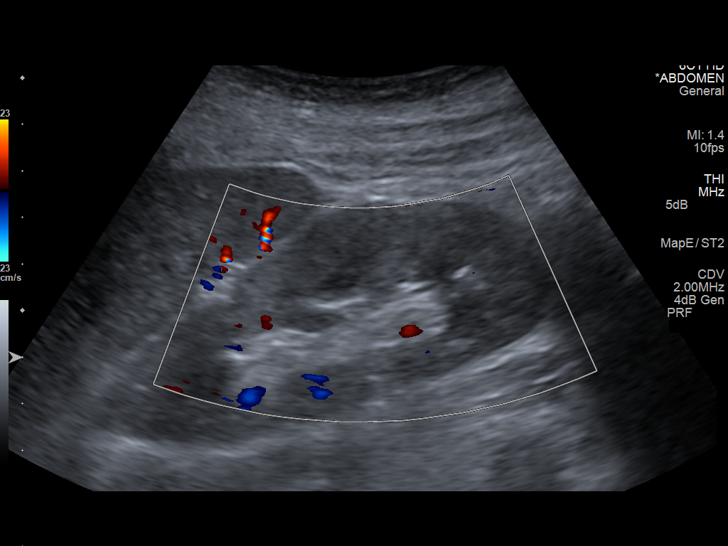
[im 28/34]
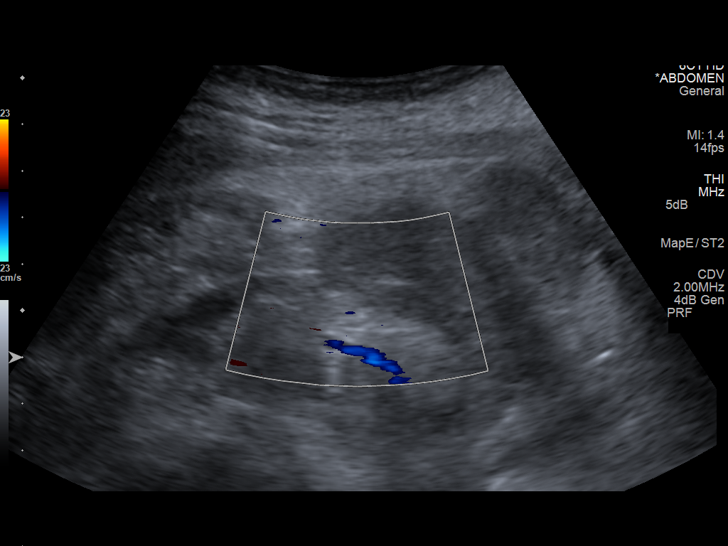
[im 31/34]
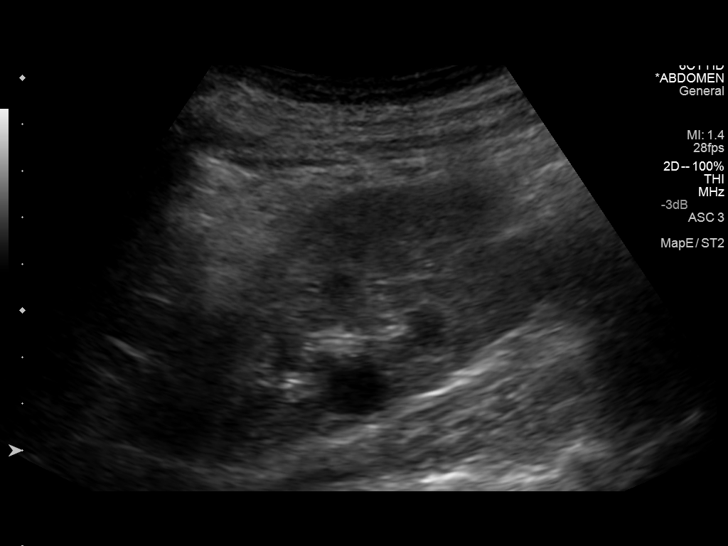
[im 34/34]
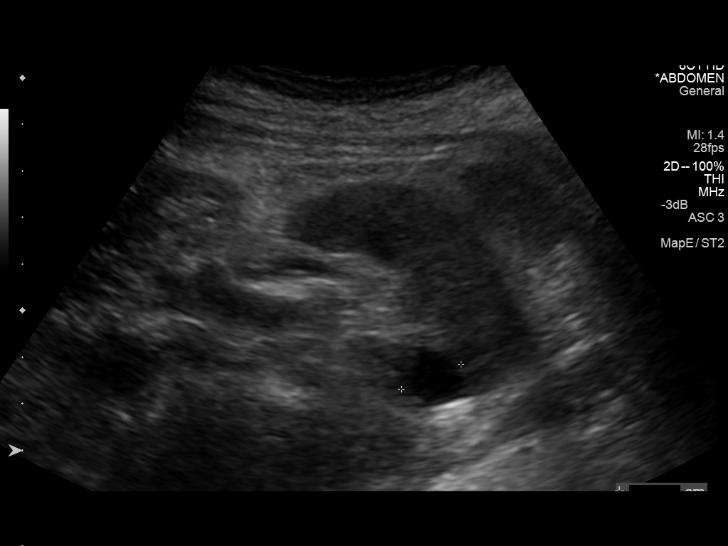

[14 of 25 positions shown; findings below may reference images not displayed]

FINDINGS: Right Kidney:

Length: 8.9 cm. Mildly increased parenchymal echogenicity noted. No
mass or hydronephrosis visualized.

Left Kidney:

Length: 9.9 cm. Mildly increased parenchymal echogenicity noted. No
hydronephrosis visualized. A 1.6 cm cyst is noted at the interpole
region of the left kidney.

Bladder:

Not characterized on this study.
IMPRESSION: 1. No evidence of hydronephrosis.
2. Mildly increased renal parenchymal echogenicity raises concern
for medical renal disease.
3. Small left renal cyst noted.
